# Patient Record
Sex: Female | Born: 1955 | Race: White | Hispanic: No | Marital: Married | Smoking: Former smoker
Health system: Southern US, Academic
[De-identification: ages and names within clinical notes are randomized; demographics above are authoritative.]

## PROBLEM LIST (undated history)

## (undated) DIAGNOSIS — R112 Nausea with vomiting, unspecified: Secondary | ICD-10-CM

## (undated) DIAGNOSIS — M109 Gout, unspecified: Secondary | ICD-10-CM

## (undated) DIAGNOSIS — E569 Vitamin deficiency, unspecified: Secondary | ICD-10-CM

## (undated) DIAGNOSIS — G2581 Restless legs syndrome: Secondary | ICD-10-CM

## (undated) DIAGNOSIS — F32A Depression, unspecified: Secondary | ICD-10-CM

## (undated) DIAGNOSIS — Z973 Presence of spectacles and contact lenses: Secondary | ICD-10-CM

## (undated) DIAGNOSIS — E039 Hypothyroidism, unspecified: Secondary | ICD-10-CM

## (undated) DIAGNOSIS — F419 Anxiety disorder, unspecified: Secondary | ICD-10-CM

## (undated) DIAGNOSIS — M1990 Unspecified osteoarthritis, unspecified site: Secondary | ICD-10-CM

## (undated) DIAGNOSIS — J309 Allergic rhinitis, unspecified: Secondary | ICD-10-CM

## (undated) DIAGNOSIS — E119 Type 2 diabetes mellitus without complications: Secondary | ICD-10-CM

## (undated) DIAGNOSIS — R519 Headache, unspecified: Secondary | ICD-10-CM

## (undated) DIAGNOSIS — I10 Essential (primary) hypertension: Secondary | ICD-10-CM

## (undated) DIAGNOSIS — G4700 Insomnia, unspecified: Secondary | ICD-10-CM

## (undated) DIAGNOSIS — E7800 Pure hypercholesterolemia, unspecified: Secondary | ICD-10-CM

## (undated) DIAGNOSIS — Z9889 Other specified postprocedural states: Secondary | ICD-10-CM

## (undated) DIAGNOSIS — F329 Major depressive disorder, single episode, unspecified: Secondary | ICD-10-CM

## (undated) HISTORY — DX: Essential (primary) hypertension: I10

## (undated) HISTORY — DX: Gout, unspecified: M10.9

## (undated) HISTORY — DX: Unspecified osteoarthritis, unspecified site: M19.90

## (undated) HISTORY — DX: Vitamin deficiency, unspecified: E56.9

## (undated) HISTORY — DX: Pure hypercholesterolemia, unspecified: E78.00

## (undated) HISTORY — PX: HX BREAST BIOPSY: SHX20

## (undated) HISTORY — DX: Type 2 diabetes mellitus without complications: E11.9

## (undated) HISTORY — PX: ESOPHAGOGASTRODUODENOSCOPY: SHX1529

## (undated) HISTORY — PX: COLONOSCOPY: SHX174

## (undated) HISTORY — DX: Restless legs syndrome: G25.81

## (undated) HISTORY — DX: Hypothyroidism, unspecified: E03.9

## (undated) HISTORY — PX: HX GALL BLADDER SURGERY/CHOLE: SHX55

## (undated) HISTORY — DX: Insomnia, unspecified: G47.00

## (undated) HISTORY — PX: HX DENTAL EXTRACTION: 2100001168

## (undated) HISTORY — DX: Allergic rhinitis, unspecified: J30.9

---
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
## 2009-07-26 ENCOUNTER — Ambulatory Visit (HOSPITAL_COMMUNITY): Payer: Self-pay | Admitting: FAMILY MEDICINE

## 2015-08-04 ENCOUNTER — Ambulatory Visit (HOSPITAL_COMMUNITY): Payer: Self-pay

## 2017-11-07 ENCOUNTER — Other Ambulatory Visit (HOSPITAL_COMMUNITY): Payer: Self-pay

## 2017-11-07 DIAGNOSIS — Z1231 Encounter for screening mammogram for malignant neoplasm of breast: Secondary | ICD-10-CM

## 2018-05-12 ENCOUNTER — Other Ambulatory Visit: Payer: 59 | Attending: FAMILY MEDICINE

## 2018-05-12 ENCOUNTER — Other Ambulatory Visit: Payer: Self-pay

## 2018-05-12 ENCOUNTER — Other Ambulatory Visit (HOSPITAL_COMMUNITY): Payer: Self-pay | Admitting: FAMILY MEDICINE

## 2018-05-12 DIAGNOSIS — E119 Type 2 diabetes mellitus without complications: Secondary | ICD-10-CM | POA: Insufficient documentation

## 2018-05-12 DIAGNOSIS — Z124 Encounter for screening for malignant neoplasm of cervix: Secondary | ICD-10-CM | POA: Insufficient documentation

## 2018-05-12 DIAGNOSIS — E039 Hypothyroidism, unspecified: Secondary | ICD-10-CM | POA: Insufficient documentation

## 2018-05-12 DIAGNOSIS — E785 Hyperlipidemia, unspecified: Principal | ICD-10-CM | POA: Insufficient documentation

## 2018-05-12 DIAGNOSIS — I10 Essential (primary) hypertension: Secondary | ICD-10-CM

## 2018-05-12 LAB — CBC WITH DIFF
BASOPHIL #: <0.1 10*3/uL (ref <=0.20)
BASOPHIL %: 0 %
EOSINOPHIL #: 0.18 x10ˆ3/uL (ref <=0.50)
EOSINOPHIL %: 3 %
HCT: 44.8 % (ref 34.8–46.0)
HGB: 13.8 g/dL (ref 11.5–16.0)
IMMATURE GRANULOCYTE #: <0.1 10*3/uL (ref <0.10)
IMMATURE GRANULOCYTE %: 0 % (ref 0–1)
LYMPHOCYTE #: 2.17 x10ˆ3/uL (ref 1.00–4.80)
LYMPHOCYTE %: 38 %
MCH: 30 pg (ref 26.0–32.0)
MCHC: 30.8 g/dL — ABNORMAL LOW (ref 31.0–35.5)
MCV: 97.4 fL (ref 78.0–100.0)
MONOCYTE #: 0.33 x10ˆ3/uL (ref 0.20–1.10)
MONOCYTE %: 6 %
MPV: 12.8 fL — ABNORMAL HIGH (ref 8.7–12.5)
NEUTROPHIL #: 2.96 x10ˆ3/uL (ref 1.50–7.70)
NEUTROPHIL %: 53 %
PLATELETS: 243 10*3/uL (ref 150–400)
RBC: 4.6 x10ˆ6/uL (ref 3.85–5.22)
RDW-CV: 13.5 % (ref 11.5–15.5)
WBC: 5.7 x10ˆ3/uL (ref 3.7–11.0)

## 2018-05-12 LAB — COMPREHENSIVE METABOLIC PANEL, NON-FASTING
ALBUMIN: 4.6 g/dL (ref 3.5–5.0)
ALKALINE PHOSPHATASE: 72 U/L (ref 38–126)
ALT (SGPT): 28 U/L (ref 0–34)
ANION GAP: 12 mmol/L (ref 8–16)
AST (SGOT): 31 U/L (ref 14–36)
BILIRUBIN TOTAL: 1.2 mg/dL (ref 0.2–1.3)
BUN/CREA RATIO: 17
BUN: 12 mg/dL (ref 7–17)
CALCIUM: 10.1 mg/dL (ref 8.4–10.2)
CHLORIDE: 98 mmol/L (ref 98–107)
CO2 TOTAL: 28 mmol/L (ref 22–30)
CREATININE: 0.7 mg/dL (ref 0.52–1.04)
ESTIMATED GFR: >60 mL/min/1.73mˆ2 (ref >60)
GLUCOSE: 158 mg/dL — ABNORMAL HIGH (ref 74–106)
POTASSIUM: 4 mmol/L (ref 3.5–5.1)
PROTEIN TOTAL: 7.4 g/dL (ref 6.3–8.2)
SODIUM: 138 mmol/L (ref 137–145)

## 2018-05-12 LAB — HGA1C (HEMOGLOBIN A1C WITH EST AVG GLUCOSE)
ESTIMATED AVERAGE GLUCOSE: 171 mg/dL
HEMOGLOBIN A1C: 7.6 % — ABNORMAL HIGH (ref 4.0–6.0)

## 2018-05-12 LAB — LIPID PANEL
CHOLESTEROL: 195 mg/dL (ref 0–199)
HDL CHOL: 50 mg/dL (ref 40–59)
LDL CALC: 98 mg/dL (ref 0–99)
TRIGLYCERIDES: 237 mg/dL — ABNORMAL HIGH (ref 0–149)
VLDL CALC: 47 mg/dL (ref 0–50)

## 2018-05-12 LAB — THYROID STIMULATING HORMONE (SENSITIVE TSH): TSH: 1.34 u[IU]/mL (ref 0.470–4.680)

## 2018-05-16 LAB — HUMAN PAPILLOMA VIRUS (HPV) BY PCR WITH HIGH RISK GENOTYPING (THINPREP)
HPV OTHER: NEGATIVE
HPV16 PCR: NEGATIVE
HPV18 PCR: NEGATIVE

## 2018-09-15 ENCOUNTER — Other Ambulatory Visit: Payer: 59 | Attending: FAMILY MEDICINE

## 2018-09-15 ENCOUNTER — Other Ambulatory Visit: Payer: Self-pay

## 2018-09-15 DIAGNOSIS — R300 Dysuria: Secondary | ICD-10-CM | POA: Insufficient documentation

## 2018-09-15 DIAGNOSIS — E119 Type 2 diabetes mellitus without complications: Secondary | ICD-10-CM | POA: Insufficient documentation

## 2018-09-15 LAB — URINALYSIS, MICROSCOPIC

## 2018-09-15 LAB — URINALYSIS, MACROSCOPIC
BILIRUBIN: NEGATIVE mg/dL
BLOOD: NEGATIVE mg/dL
GLUCOSE: NEGATIVE mg/dL
KETONES: NEGATIVE mg/dL
NITRITE: NEGATIVE
PH: 6.5 (ref 5.0–8.5)
PROTEIN: NEGATIVE mg/dL
SPECIFIC GRAVITY: <=1.005 (ref 1.005–1.030)
UROBILINOGEN: 0.2 mg/dL

## 2018-09-25 ENCOUNTER — Ambulatory Visit (INDEPENDENT_AMBULATORY_CARE_PROVIDER_SITE_OTHER): Payer: 59 | Admitting: Surgery

## 2018-09-25 ENCOUNTER — Ambulatory Visit
Admission: RE | Admit: 2018-09-25 | Discharge: 2018-09-25 | Disposition: A | Discharge status: Home / Self Care | Payer: 59 | Source: Ambulatory Visit | Attending: Surgery | Admitting: Surgery

## 2018-09-25 ENCOUNTER — Encounter (INDEPENDENT_AMBULATORY_CARE_PROVIDER_SITE_OTHER): Payer: Self-pay | Admitting: Surgery

## 2018-09-25 ENCOUNTER — Other Ambulatory Visit: Payer: Self-pay

## 2018-09-25 VITALS — Ht 64.0 in | Wt 227.0 lb

## 2018-09-25 DIAGNOSIS — E119 Type 2 diabetes mellitus without complications: Secondary | ICD-10-CM | POA: Insufficient documentation

## 2018-09-25 DIAGNOSIS — Z1211 Encounter for screening for malignant neoplasm of colon: Secondary | ICD-10-CM | POA: Insufficient documentation

## 2018-09-25 DIAGNOSIS — I10 Essential (primary) hypertension: Secondary | ICD-10-CM

## 2018-09-25 DIAGNOSIS — E039 Hypothyroidism, unspecified: Secondary | ICD-10-CM | POA: Insufficient documentation

## 2018-09-25 DIAGNOSIS — Z79890 Hormone replacement therapy (postmenopausal): Secondary | ICD-10-CM | POA: Insufficient documentation

## 2018-09-25 DIAGNOSIS — Z01818 Encounter for other preprocedural examination: Secondary | ICD-10-CM | POA: Insufficient documentation

## 2018-09-25 DIAGNOSIS — Z7984 Long term (current) use of oral hypoglycemic drugs: Secondary | ICD-10-CM | POA: Insufficient documentation

## 2018-09-25 DIAGNOSIS — M1990 Unspecified osteoarthritis, unspecified site: Secondary | ICD-10-CM | POA: Insufficient documentation

## 2018-09-25 DIAGNOSIS — Z79899 Other long term (current) drug therapy: Secondary | ICD-10-CM | POA: Insufficient documentation

## 2018-09-25 DIAGNOSIS — Z7982 Long term (current) use of aspirin: Secondary | ICD-10-CM | POA: Insufficient documentation

## 2018-09-25 DIAGNOSIS — Z9049 Acquired absence of other specified parts of digestive tract: Secondary | ICD-10-CM | POA: Insufficient documentation

## 2018-09-25 DIAGNOSIS — Z791 Long term (current) use of non-steroidal anti-inflammatories (NSAID): Secondary | ICD-10-CM | POA: Insufficient documentation

## 2018-09-25 LAB — ECG 12-LEAD
Atrial Rate: 79 {beats}/min
Calculated P Axis: 60 degrees
Calculated R Axis: 53 degrees
Calculated T Axis: 39 degrees
PR Interval: 154 ms
QRS Duration: 82 ms
QT Interval: 394 ms
QTC Calculation: 451 ms
Ventricular rate: 79 {beats}/min

## 2018-09-25 NOTE — H&P (Signed)
[facility]  GENERAL SURGERY, AMBULATORY CARE CENTER  [address] [place]  [place] [address]  [phone]   General Surgery History & Physical    Date of Service:  09/25/2018  Gwendolyn Crawford, Gwendolyn Crawford, 63 y.o. female  Date of Birth:  April 11, 1955  PCP: Charlie Pitter, MD  Referring:  Charlie Pitter     Chief Complaint:  Colonoscopy    HPI:  Gwendolyn Crawford is a 63 y.o. White female who presents for  screening colonoscopy.  The patient states her last colonoscopy was greater than 10 years ago.  She denies any prior history of colon polyps.  She denies any family history of colon or rectal cancer.  She has no lower GI symptoms or complaints at this time.      Past Medical History:   Diagnosis Date   . Allergic rhinitis    . Arthritis    . Diabetes mellitus, type 2 (CMS HCC)    . Hypertension    . Hypothyroidism    . Vitamin deficiency       Past Surgical History:   Procedure Laterality Date   . COLONOSCOPY     . HX CHOLECYSTECTOMY        Social History     Tobacco Use   . Smoking status: Never Smoker   . Smokeless tobacco: Never Used   Substance Use Topics   . Alcohol use: Never     Frequency: Never   . Drug use: Not on file       Family Medical History:     None         Outpatient Medications Marked as Taking for the 09/25/18 encounter (Office Visit) with Royetta Crochet, MD   Medication Sig   . acetaminophen (TYLENOL) 325 mg Oral Tablet Take 325 mg by mouth Every 4 hours as needed for Pain   . allopurinoL (ZYLOPRIM) 100 mg Oral Tablet    . alpha lipoic acid 600 mg Oral Tablet Take by mouth Twice daily   . ascorbic acid (VITAMIN C) 1,000 mg Oral Tablet Take 1,000 mg by mouth Once a day   . aspirin (ECOTRIN) 81 mg Oral Tablet, Delayed Release (E.C.) Take 81 mg by mouth Once a day   . atorvastatin (LIPITOR) 10 mg Oral Tablet    . biotin 1 mg Oral Capsule Take by mouth   . Biotin 2,500 mcg Oral Capsule Take by mouth Take 2 capsules daily   . cholecalciferol, vitamin D3, 25 mcg (1,000 unit) Oral Tablet Take  1,000 Units by mouth Once a day   . cinnamon bark (CINNAMON ORAL) Take 1,000 mg by mouth bid   . cyanocobalamin (VITAMIN B 12) 1,000 mcg Oral Tablet Take 1,000 mcg by mouth Once a day   . DULoxetine (CYMBALTA DR) 60 mg Oral Capsule, Delayed Release(E.C.)    . gabapentin (NEURONTIN) 600 mg Oral Tablet    . glimepiride (AMARYL) 1 mg Oral Tablet    . levothyroxine (SYNTHROID) 100 mcg Oral Tablet    . lisinopriL (PRINIVIL) 5 mg Oral Tablet    . loratadine (CLARITIN) 10 mg Oral Tablet Take 10 mg by mouth Once a day   . meclizine (ANTIVERT) 25 mg Oral Tablet    . MetFORMIN (GLUCOPHAGE) 1,000 mg Oral Tablet    . multivit with minerals/lutein (MULTIVITAMIN 50 PLUS ORAL) Take by mouth   . naproxen Sodium (ALEVE) 220 mg Oral Tablet Take 220 mg by mouth Twice daily with food   . triamcinolone  acetonide (ARISTOCORT) 0.5 % Cream       No Known Allergies     Review of Systems   Constitutional: Negative for chills and fever.   Respiratory: Negative for cough and shortness of breath.    Cardiovascular: Negative for chest pain and palpitations.   Gastrointestinal: Negative for nausea and vomiting.   Genitourinary: Negative for dysuria.   Skin: Negative for itching and rash.   Psychiatric/Behavioral: Negative for depression. The patient is not nervous/anxious.           Ht 1.626 m (_0 )   Wt 103 kg (227 lb)   BMI 38.96 kg/m          Physical Exam   Constitutional: She is oriented to person, place, and time and well-developed, well-nourished, and in no distress. No distress.   HENT:   Head: Normocephalic and atraumatic.   Cardiovascular: Intact distal pulses.   Pulmonary/Chest: Effort normal. No respiratory distress.   Abdominal: Soft. She exhibits no distension. There is no abdominal tenderness.   Neurological: She is alert and oriented to person, place, and time.   Skin: Skin is warm and dry. She is not diaphoretic. No erythema.   Psychiatric: Mood, affect and judgment normal.        Data Review:  Pertinent laboratory data and  imaging studies reviewed.      Assessment/Plan:  1. Screening for colon cancer         Patient will be scheduled for colonoscopy for screening.  Hold aspirin 5 days prior.  Preoperative ECG will be ordered.    Return for schedule colonoscopy, MAC, hold ASA for 5 days.     This note was partially created using voice recognition software and is inherently subject to errors including those of syntax and "sound alike " substitutions which may escape proof reading. In such instances, original meaning may be extrapolated by contextual derivation.    Royetta Crochet, MD

## 2018-09-25 NOTE — H&P (View-Only) (Signed)
[facility]  GENERAL SURGERY, [facility]BULATORY CARE CENTER  [address]  [place] [address]  [phone]   General Surgery History & Physical    Date of Service:  09/25/2018  Camey, Deaisa J, 63 y.o. female  Date of Birth:  07/05/1955  PCP: Sunita Greenberg, MD  Referring:  Sunita Greenberg     Chief Complaint:  Colonoscopy    HPI:  Maleah J Leppo is a 63 y.o. White female who presents for  screening colonoscopy.  The patient states her last colonoscopy was greater than 10 years ago.  She denies any prior history of colon polyps.  She denies any family history of colon or rectal cancer.  She has no lower GI symptoms or complaints at this time.      Past Medical History:   Diagnosis Date   . Allergic rhinitis    . Arthritis    . Diabetes mellitus, type 2 (CMS HCC)    . Hypertension    . Hypothyroidism    . Vitamin deficiency       Past Surgical History:   Procedure Laterality Date   . COLONOSCOPY     . HX CHOLECYSTECTOMY        Social History     Tobacco Use   . Smoking status: Never Smoker   . Smokeless tobacco: Never Used   Substance Use Topics   . Alcohol use: Never     Frequency: Never   . Drug use: Not on file       Family Medical History:     None         Outpatient Medications Marked as Taking for the 09/25/18 encounter (Office Visit) with Manaia Samad, MD   Medication Sig   . acetaminophen (TYLENOL) 325 mg Oral Tablet Take 325 mg by mouth Every 4 hours as needed for Pain   . allopurinoL (ZYLOPRIM) 100 mg Oral Tablet    . alpha lipoic acid 600 mg Oral Tablet Take by mouth Twice daily   . ascorbic acid (VITAMIN C) 1,000 mg Oral Tablet Take 1,000 mg by mouth Once a day   . aspirin (ECOTRIN) 81 mg Oral Tablet, Delayed Release (E.C.) Take 81 mg by mouth Once a day   . atorvastatin (LIPITOR) 10 mg Oral Tablet    . biotin 1 mg Oral Capsule Take by mouth   . Biotin 2,500 mcg Oral Capsule Take by mouth Take 2 capsules daily   . cholecalciferol, vitamin D3, 25 mcg (1,000 unit) Oral Tablet Take  1,000 Units by mouth Once a day   . cinnamon bark (CINNAMON ORAL) Take 1,000 mg by mouth bid   . cyanocobalamin (VITAMIN B 12) 1,000 mcg Oral Tablet Take 1,000 mcg by mouth Once a day   . DULoxetine (CYMBALTA DR) 60 mg Oral Capsule, Delayed Release(E.C.)    . gabapentin (NEURONTIN) 600 mg Oral Tablet    . glimepiride (AMARYL) 1 mg Oral Tablet    . levothyroxine (SYNTHROID) 100 mcg Oral Tablet    . lisinopriL (PRINIVIL) 5 mg Oral Tablet    . loratadine (CLARITIN) 10 mg Oral Tablet Take 10 mg by mouth Once a day   . meclizine (ANTIVERT) 25 mg Oral Tablet    . MetFORMIN (GLUCOPHAGE) 1,000 mg Oral Tablet    . multivit with minerals/lutein (MULTIVITAMIN 50 PLUS ORAL) Take by mouth   . naproxen Sodium (ALEVE) 220 mg Oral Tablet Take 220 mg by mouth Twice daily with food   . triamcinolone   acetonide (ARISTOCORT) 0.5 % Cream       No Known Allergies     Review of Systems   Constitutional: Negative for chills and fever.   Respiratory: Negative for cough and shortness of breath.    Cardiovascular: Negative for chest pain and palpitations.   Gastrointestinal: Negative for nausea and vomiting.   Genitourinary: Negative for dysuria.   Skin: Negative for itching and rash.   Psychiatric/Behavioral: Negative for depression. The patient is not nervous/anxious.           Ht 1.626 m (5' 4")   Wt 103 kg (227 lb)   BMI 38.96 kg/m          Physical Exam   Constitutional: She is oriented to person, place, and time and well-developed, well-nourished, and in no distress. No distress.   HENT:   Head: Normocephalic and atraumatic.   Cardiovascular: Intact distal pulses.   Pulmonary/Chest: Effort normal. No respiratory distress.   Abdominal: Soft. She exhibits no distension. There is no abdominal tenderness.   Neurological: She is alert and oriented to person, place, and time.   Skin: Skin is warm and dry. She is not diaphoretic. No erythema.   Psychiatric: Mood, affect and judgment normal.        Data Review:  Pertinent laboratory data and  imaging studies reviewed.      Assessment/Plan:  1. Screening for colon cancer         Patient will be scheduled for colonoscopy for screening.  Hold aspirin 5 days prior.  Preoperative ECG will be ordered.    Return for schedule colonoscopy, MAC, hold ASA for 5 days.     This note was partially created using voice recognition software and is inherently subject to errors including those of syntax and "sound alike " substitutions which may escape proof reading. In such instances, original meaning may be extrapolated by contextual derivation.    Harlie Ragle, MD

## 2018-09-25 NOTE — Patient Instructions (Signed)
INSTRUCTIONS FOR A COLONOSCOPY BOWEL PREP    Your surgical procedure is scheduled for 10-03-2018     To ensure a thorough examination, your colon must be completely emptied of all waste products.  It is important to complete your prep as instructed.  An incomplete prep can block your doctor's view of your colon--potentially leading to missed lesions or polyps, and may result in a cancelled procedure.  You will need to purchase the following items from your local pharmacy for your bowel prep:     ? 4 Dulcolax tablets  ? 238 gram bottle of Miralax Laxative Powder  ? 64 ounces of Gatorade, Sprite, or any clear liquid (NO RED or PURPLE!!)       The day before your procedure:     Your diet should consist of ONLY clear liquids and NO solid floods.  This includes the following:     Water Strained fruit juice without pulp Tea or coffee (without milk or cream)   Clear broth Ginger ale Lemon-lime soda   Sports drinks Flavor drinks Plain Jell-O   Popsicles        At Noon:   ? Take 2 Dulcolax tablets.  ? Mix half of Miralax with 32 oz. of Gatorade, sprite or any clear liquid.  ? Drink an 8 oz. glass of the mixture every 15-20 minutes.     At 4 PM   ? Repeat the steps above.  ? After you have completed your prep, please continue on a CLEAR LIQUID DIET until midnight.  This will help keep you hydrated.     After Midnight:   ? DO NOT EAT OR DRINK AFTER MIDNIGHT they day of your procedure, unless otherwise instructed.    ? This INCLUDES gum, mints, and candy          The surgery department will contact you the day before your procedure between 1 PM and 3 PM with your arrival time.  If you are scheduled on a Monday, you will be contacted the Friday afternoon prior to your procedure.       **REMINDER:  A pre-op assessment must be done within one week of the date of your procedure.    You may contact the pre-op nurse at [phone].  *You will need to bring your insurance card and photo ID with you to all appointments.    Any  questions please call our office [phone]

## 2018-09-26 ENCOUNTER — Other Ambulatory Visit: Payer: Self-pay

## 2018-09-26 ENCOUNTER — Encounter (HOSPITAL_COMMUNITY): Payer: Self-pay | Admitting: Surgery

## 2018-10-03 ENCOUNTER — Ambulatory Visit (HOSPITAL_COMMUNITY): Payer: 59 | Admitting: Certified Registered"

## 2018-10-03 ENCOUNTER — Encounter (HOSPITAL_COMMUNITY): Payer: Self-pay | Admitting: Surgery

## 2018-10-03 ENCOUNTER — Other Ambulatory Visit: Payer: Self-pay

## 2018-10-03 ENCOUNTER — Inpatient Hospital Stay
Admission: RE | Admit: 2018-10-03 | Discharge: 2018-10-03 | Disposition: A | Discharge status: Home / Self Care | Payer: 59 | Source: Ambulatory Visit | Attending: Surgery | Admitting: Surgery

## 2018-10-03 ENCOUNTER — Encounter (HOSPITAL_COMMUNITY)
Admission: RE | Disposition: A | Discharge status: Home / Self Care | Payer: Self-pay | Source: Ambulatory Visit | Attending: Surgery

## 2018-10-03 DIAGNOSIS — Z7984 Long term (current) use of oral hypoglycemic drugs: Secondary | ICD-10-CM | POA: Insufficient documentation

## 2018-10-03 DIAGNOSIS — M1990 Unspecified osteoarthritis, unspecified site: Secondary | ICD-10-CM | POA: Insufficient documentation

## 2018-10-03 DIAGNOSIS — E119 Type 2 diabetes mellitus without complications: Secondary | ICD-10-CM | POA: Insufficient documentation

## 2018-10-03 DIAGNOSIS — Z7982 Long term (current) use of aspirin: Secondary | ICD-10-CM | POA: Insufficient documentation

## 2018-10-03 DIAGNOSIS — I10 Essential (primary) hypertension: Secondary | ICD-10-CM | POA: Insufficient documentation

## 2018-10-03 DIAGNOSIS — K219 Gastro-esophageal reflux disease without esophagitis: Secondary | ICD-10-CM | POA: Insufficient documentation

## 2018-10-03 DIAGNOSIS — F329 Major depressive disorder, single episode, unspecified: Secondary | ICD-10-CM | POA: Insufficient documentation

## 2018-10-03 DIAGNOSIS — K5730 Diverticulosis of large intestine without perforation or abscess without bleeding: Secondary | ICD-10-CM | POA: Insufficient documentation

## 2018-10-03 DIAGNOSIS — Z1211 Encounter for screening for malignant neoplasm of colon: Secondary | ICD-10-CM | POA: Insufficient documentation

## 2018-10-03 DIAGNOSIS — Z79899 Other long term (current) drug therapy: Secondary | ICD-10-CM | POA: Insufficient documentation

## 2018-10-03 DIAGNOSIS — F419 Anxiety disorder, unspecified: Secondary | ICD-10-CM | POA: Insufficient documentation

## 2018-10-03 DIAGNOSIS — E039 Hypothyroidism, unspecified: Secondary | ICD-10-CM | POA: Insufficient documentation

## 2018-10-03 HISTORY — DX: Anxiety disorder, unspecified: F41.9

## 2018-10-03 HISTORY — DX: Nausea with vomiting, unspecified: R11.2

## 2018-10-03 HISTORY — DX: Presence of spectacles and contact lenses: Z97.3

## 2018-10-03 HISTORY — DX: Nausea with vomiting, unspecified: Z98.890

## 2018-10-03 HISTORY — DX: Depression, unspecified: F32.A

## 2018-10-03 HISTORY — DX: Hypothyroidism, unspecified: E03.9

## 2018-10-03 HISTORY — DX: Headache, unspecified: R51.9

## 2018-10-03 SURGERY — COLONOSCOPY
Anesthesia: IV General | Wound class: Clean Contaminated Wounds-The respiratory, GI, Genital, or urinary

## 2018-10-03 MED ORDER — SODIUM CHLORIDE 0.9 % (FLUSH) INJECTION SYRINGE
10.0000 mL | INJECTION | Freq: Three times a day (TID) | INTRAMUSCULAR | Status: DC
Start: 2018-10-03 — End: 2018-10-03

## 2018-10-03 MED ORDER — SODIUM CHLORIDE 0.9 % INTRAVENOUS SOLUTION
12.5000 mg | Freq: Once | INTRAVENOUS | Status: DC | PRN
Start: 2018-10-03 — End: 2018-10-03

## 2018-10-03 MED ORDER — LIDOCAINE (PF) 20 MG/ML (2 %) INJECTION SOLUTION
INTRAMUSCULAR | Status: AC
Start: 2018-10-03 — End: 2018-10-03
  Filled 2018-10-03: qty 5

## 2018-10-03 MED ORDER — SODIUM CHLORIDE 0.9 % (FLUSH) INJECTION SYRINGE
10.0000 mL | INJECTION | Freq: Three times a day (TID) | INTRAMUSCULAR | Status: DC
Start: 2018-10-03 — End: 2018-10-03
  Administered 2018-10-03: 09:00:00

## 2018-10-03 MED ORDER — PROPOFOL 10 MG/ML INTRAVENOUS EMULSION
INTRAVENOUS | Status: DC | PRN
Start: 2018-10-03 — End: 2018-10-03
  Administered 2018-10-03: 200 ug/kg/min via INTRAVENOUS
  Administered 2018-10-03: 180 ug/kg/min via INTRAVENOUS
  Administered 2018-10-03: 140 ug/kg/min via INTRAVENOUS
  Administered 2018-10-03: 0 ug/kg/min via INTRAVENOUS

## 2018-10-03 MED ORDER — ONDANSETRON HCL (PF) 4 MG/2 ML INJECTION SOLUTION
4.0000 mg | Freq: Once | INTRAMUSCULAR | Status: DC | PRN
Start: 2018-10-03 — End: 2018-10-03

## 2018-10-03 MED ORDER — SODIUM CHLORIDE 0.9 % (FLUSH) INJECTION SYRINGE
10.0000 mL | INJECTION | INTRAMUSCULAR | Status: DC | PRN
Start: 2018-10-03 — End: 2018-10-03

## 2018-10-03 MED ORDER — SODIUM CHLORIDE 0.9 % INTRAVENOUS SOLUTION
INTRAVENOUS | Status: DC | PRN
Start: 2018-10-03 — End: 2018-10-03
  Administered 2018-10-03 (×2): via INTRAVENOUS

## 2018-10-03 MED ORDER — ONDANSETRON HCL (PF) 4 MG/2 ML INJECTION SOLUTION
Freq: Once | INTRAMUSCULAR | Status: DC | PRN
Start: 2018-10-03 — End: 2018-10-03
  Administered 2018-10-03: 4 mg via INTRAVENOUS

## 2018-10-03 MED ORDER — ONDANSETRON HCL (PF) 4 MG/2 ML INJECTION SOLUTION
INTRAMUSCULAR | Status: AC
Start: 2018-10-03 — End: 2018-10-03
  Filled 2018-10-03: qty 2

## 2018-10-03 MED ORDER — PROPOFOL 10 MG/ML IV BOLUS
INJECTION | Freq: Once | INTRAVENOUS | Status: DC | PRN
Start: 2018-10-03 — End: 2018-10-03
  Administered 2018-10-03: 50 mg via INTRAVENOUS

## 2018-10-03 MED ORDER — FENTANYL (PF) 50 MCG/ML INJECTION SOLUTION
12.5000 ug | INTRAMUSCULAR | Status: DC | PRN
Start: 2018-10-03 — End: 2018-10-03

## 2018-10-03 MED ORDER — PROPOFOL 10 MG/ML INTRAVENOUS EMULSION
INTRAVENOUS | Status: AC
Start: 2018-10-03 — End: 2018-10-03
  Filled 2018-10-03: qty 50

## 2018-10-03 MED ORDER — SODIUM CHLORIDE 0.9 % INTRAVENOUS SOLUTION
INTRAVENOUS | Status: DC
Start: 2018-10-03 — End: 2018-10-03
  Administered 2018-10-03: 08:00:00 via INTRAVENOUS

## 2018-10-03 MED ORDER — FENTANYL (PF) 50 MCG/ML INJECTION SOLUTION
25.0000 ug | INTRAMUSCULAR | Status: DC | PRN
Start: 2018-10-03 — End: 2018-10-03

## 2018-10-03 SURGICAL SUPPLY — 30 items
BRUSH ENDOS CLN 220CM 2-4.2MM PLASTIC CMBN STF THK BRSTL LINE UP DISP (SURGICAL INSTRUMENTS) ×1 IMPLANT
CAN SUCT 800ML PR SPOUT VACU SHUT OFF FILTER HDRPHB 7.25IN 5IN 3 3/8IN (Suction) ×1 IMPLANT
CLIP HMST RADOPQ PRELD STRL DISP RSL 235CM 2.8MM 11MM OPN (SURGICAL INSTRUMENTS) IMPLANT
DEVICE RESOLUTION CLIP 235CML_M00522611 11MMW 10EA/BX (INSTRUMENTS)
DISC NO SUB - SNARE P5MM OVAL 230CM 25MM 3MM ISNARE ALT INJ NEEDLE OLMPS ACT CORD CONN SPRG LD LL HNDL ENDOS (ENDOSCOPIC SUPPLIES) IMPLANT
DUPE USE ITEM 128182 - LIGATOR 5IN .6IN SHORTSHOT SA_TRIVIEW 4 BAND HMRHD ANOSCP (ENDOSCOPIC SUPPLIES) IMPLANT
ELECTRODE PATIENT RTN 9FT VLAB C30- LB RM PHSV ACRL FOAM CORD NONIRRITATE NONSENSITIZE ADH STRP (CAUTERY SUPPLIES) IMPLANT
FORCEPS BIOPSY ALGTR 230CM 2.8MM ENDOJAW SS STRL DISP (SURGICAL INSTRUMENTS) IMPLANT
FORCEPS BIOPSY ALGTR 230CM 2.8_MM ENDOJAW RAT TOOTH STRL DISP (INSTRUMENTS)
GOWN ISOL UNIV BLU HRSK P2 IMP RV OPN BACK CLS LOOP NK STRUP (DGOW) ×2 IMPLANT
MARKER ENDOS PMT ENDOMARK DIL INDIA INK 10ML STRL (GI LAB SUPPLIES) IMPLANT
NEEDLE HYPO 18GA 1IN EDGE NEE_DLEPRO JELCO BVL 1 HAND ACT (NEEDLES & SYRINGE SUPPLIES) IMPLANT
NEEDLE SCLRTX 25GA 2.5MM INJ L L SPRG LD HNDL SHTH STRL DISP (NEEDLES & SYRINGE SUPPLIES) IMPLANT
NET SPEC RETR 230CM 2.5MM RTHNT STD SHEATH 6X3CM NONST LF  DISP (Dilators) IMPLANT
PEN SURG MRKNG INDIA INK (GI LAB SUPPLIES)
PROBE COAG 7.2FT 6.9FR FIAPC CRCMF PLUG PLAY FUNCTIONALITY FILTER (ENDOSCOPIC SUPPLIES) IMPLANT
PROBE ESURG 7.2FT 6.9FR FIAPC_CRCMF PLUG PLAY FUNCTIONALITY (INSTRUMENTS ENDOMECHANICAL)
SNARE THK.47MM STD OVAL 2300MM 2.8MM 25MM SNAREMASTER BRD WRE INT HNDL SHORT LOOP ENDOS CF GIF (BASKET) IMPLANT
SNARE THK.47MM STD OVL 2300MM 2.8MM SNAREMASTER BRD WRE INT (BASKET)
SOL IRRG 0.9% NACL 1000ML PLASTIC PR BTL ISTNC N-PYRG STRL LF (SOLUTIONS) IMPLANT
SOL IV 0.9% NACL 10ML DEHP-FR PRSV FR 1 DS PLASTIC FLIPTOP VIAL USP LF  LIGHT GRN (IV TUBING & ACCESSORIES) IMPLANT
SOL IV 0.9% NACL 10ML PRSV FR 1 DS DEHP-FR PLASTIC FLPTP (IV Tubing & Accessories)
SOLUTION IRRG NS 2F7124 1000CC_12/CS (SOLUTIONS)
SYRINGE GENTLCARE 60CC STRL CATH TIP PEEL PCH SUCT TRANSLUC BULB POLYPROP THRMPLST ELASTO DISP (NEEDLES & SYRINGE SUPPLIES) IMPLANT
SYRINGE LL 10ML LF  STRL GRAD N-PYRG DEHP-FR PVC FREE MED DISP (NEEDLES & SYRINGE SUPPLIES) IMPLANT
TRAP SPECI REM 15CM ETRAP MAGNIFY WIND MSR GUIDE PLPCTM STRL LF  DISP (SURGICAL INSTRUMENTS) IMPLANT
TUBE ASP 3.25IN .25IN ARGYLE PE POLYSTY 9IN 8ML STRL LTX DISP (Suction) IMPLANT
TUBING SUCT 12FT 3/16IN UNIV NCDTV FEMALE CONN STRL LF (Suction) ×1 IMPLANT
UNDERPD INCONT 36X30IN POLYPRO P XHVY ABS NWVN FLF FIL SPNBD (INCT) ×1 IMPLANT
WATER STRL 1000ML PLASTIC PR BTL LF (SOLUTIONS) ×1 IMPLANT

## 2018-10-03 NOTE — Anesthesia Transfer of Care (Signed)
ANESTHESIA TRANSFER OF CARE   Gwendolyn Crawford is a 63 y.o. ,female, Weight: 103 kg (227 lb)   had Procedure(s) with comments:  COLONOSCOPY - PATIENT IS A DIABETIC  performed  10/03/18   Primary Service: Royetta Crochet, MD    Past Medical History:   Diagnosis Date   . Allergic rhinitis    . Anxiety    . Arthritis    . Depression    . Diabetes mellitus, type 2 (CMS HCC)    . Headache    . Hypertension    . Hypothyroid    . Hypothyroidism    . PONV (postoperative nausea and vomiting)    . Type 2 diabetes mellitus (CMS HCC)    . Vitamin deficiency    . Wears glasses       Allergy History as of 10/03/18     ROSUVASTATIN       Noted Status Severity Type Reaction    10/03/18 0813 Inocente Salles, RN 10/03/18 Active Low Intolerance  Other Adverse Reaction (Add comment)    Comments:  unknown               I completed my transfer of care / handoff to the receiving personnel during which we discussed:  Access, Airway, All key/critical aspects of case discussed, Analgesia, Antibiotics, Expectation of post procedure, Fluids/Product, Gave opportunity for questions and acknowledgement of understanding, Labs and PMHx    Post Location: PACU                                          Additional Info:[name], RN                      Last OR Temp: Temperature: 36.1 C (97 F)  ABG:  POTASSIUM   Date Value Ref Range Status   05/12/2018 4.0 3.5 - 5.1 mmol/L Final     KETONES   Date Value Ref Range Status   09/15/2018 Negative Negative mg/dL Final     CALCIUM   Date Value Ref Range Status   05/12/2018 10.1 8.4 - 10.2 mg/dL Final     Calculated P Axis   Date Value Ref Range Status   09/25/2018 60 degrees Final     Calculated R Axis   Date Value Ref Range Status   09/25/2018 53 degrees Final     Calculated T Axis   Date Value Ref Range Status   09/25/2018 39 degrees Final     Airway:* No LDAs found *  Blood pressure (!) 134/59, pulse 78, temperature 36.1 C (97 F), resp. rate 16, height 1.626 m (5\' 4" ), weight 103 kg (227 lb), SpO2 98 %.

## 2018-10-03 NOTE — Interval H&P Note (Signed)
[facility]      H&P O'Brien, 63 y.o. female  Date of Admission:  10/03/2018  Date of Birth:  [birth year]    10/03/2018    STOP: IF H&P IS GREATER THAN 30 DAYS FROM SURGICAL DAY COMPLETE NEW H&P IS REQUIRED.     H & P updated the day of the procedure.  1.  H&P completed within 30 days of surgical procedure  and has been reviewed within 24 hours of the surgery, the patient has been examined, and no change has occured in the patients condition since the H&P was completed.       Change in medications: No      Comments:     2.  Patient continues to be appropriate candidate for planned surgical procedure. YES    Royetta Crochet, MD

## 2018-10-03 NOTE — Anesthesia Postprocedure Evaluation (Signed)
Anesthesia Post Op Evaluation    Patient: Gwendolyn Crawford  Procedure(s) with comments:  COLONOSCOPY - PATIENT IS A DIABETIC    Last Vitals:Temperature: 36.1 C (97 F) (10/03/18 1000)  Heart Rate: 69 (10/03/18 1000)  BP (Non-Invasive): (!) 109/53 (10/03/18 1000)  Respiratory Rate: 16 (10/03/18 1000)  SpO2: 100 % (10/03/18 1000)    Patient is sufficiently recovered from the effects of anesthesia to participate in the evaluation and has returned to their pre-procedure level.  Patient location during evaluation: PACU       Patient participation: waiting for patient participation  Level of consciousness: responsive to verbal stimuli    Pain score: 0  Pain management: adequate  Airway patency: patent    Anesthetic complications: no  Cardiovascular status: acceptable  Respiratory status: acceptable, face mask, nonlabored ventilation and spontaneous ventilation  Hydration status: acceptable  Patient post-procedure temperature: Pt Normothermic   PONV Status: Absent

## 2018-10-03 NOTE — Discharge Instructions (Signed)
SURGICAL DISCHARGE INSTRUCTIONS     Dr. Mullins, Bandy, MD  performed your COLONOSCOPY today at the [facility]    [facility] ([place])  Monday through Friday 8:00 am - 4:00 pm  [phone]  Between 4:00 pm and 8:00 am, weekends and holidays:  Call [facility] at [phone]    PLEASE SEE WRITTEN HANDOUTS AS DISCUSSED BY YOUR NURSE:  Shaneeka Scarboro, RN      SIGNS AND SYMPTOMS OF A WOUND / INCISION INFECTION   Be sure to watch for the following:  Increase in redness or red streaks near or around the wound or incision.  Increase in pain that is intense or severe and cannot be relieved by the pain medication that your doctor has given you.  Increase in swelling that cannot be relieved by elevation of a body part, or by applying ice, if permitted.  Increase in drainage, or if yellow / green in color and smells bad. This could be on a dressing or a cast.  Increase in fever for longer than 24 hours, or an increase that is higher than 101 degrees Fahrenheit (normal body temperature is 98 degrees Fahrenheit). The incision may feel warm to the touch.    **CALL YOUR DOCTOR IF ONE OR MORE OF THESE SIGNS / SYMPTOMS SHOULD OCCUR.    ANESTHESIA INFORMATION   ANESTHESIA -- ADULT PATIENTS:  You have received intravenous sedation / general anesthesia, and you may feel drowsy and light-headed for several hours. You may even experience some forgetfulness of the procedure. DO NOT DRIVE A MOTOR VEHICLE or perform any activity requiring complete alertness or coordination until you feel fully awake in about 24-48 hours. Do not drink alcoholic beverages for at least 24 hours. Do not stay alone, you must have a responsible adult available to be with you. You may also experience a dry mouth or nausea for 24 hours. This is a normal side effect and will disappear as the effects of the medication wear off.    REMEMBER   If you experience any difficulty breathing, chest pain, bleeding that you feel is  excessive, persistent nausea or vomiting or for any other concerns:  Call your physician Dr.  Mullins, Bandy, MD    [phone] at [facility] or [phone] You may also ask to have the general doctor on call paged. They are available to you 24 hours a day.    SPECIAL INSTRUCTIONS / COMMENTS       FOLLOW-UP APPOINTMENTS   Please call your surgeon's office at the number listed about  to schedule a date / time of return.

## 2018-10-03 NOTE — Anesthesia Preprocedure Evaluation (Addendum)
ANESTHESIA PRE-OP EVALUATION  Planned Procedure: COLONOSCOPY (N/A )  Review of Systems    PONV       patient summary reviewed  nursing notes reviewed        Pulmonary  negative pulmonary ROS,    Cardiovascular    Hypertension, ECG reviewed and NSR per EKG  Denies CP or SOB with exertion ,       GI/Hepatic/Renal    Food related, GERD and well controlled     Endo/Other    FSBG 182 and hypothyroidism,   type 2 diabetes     Neuro/Psych/MS    headaches, anxiety and depression     Cancer  negative hematology/oncology ROS,                  Physical Assessment      Patient summary reviewed and Nursing notes reviewed   Airway       Mallampati: II    TM distance: >3 FB    Neck ROM: full  Mouth Opening: good.            Dental           (+) chipped           Pulmonary    Breath sounds clear to auscultation       Cardiovascular    Rhythm: regular  Rate: Normal       Other findings            Plan  ASA 3     Planned anesthesia type: MAC and TIVA              Intravenous induction     Anesthesia issues/risks discussed are: Stroke, Intraoperative Awareness/ Recall, Cardiac Events/MI, Post-op Cognitive Dysfunction and Aspiration.  Anesthetic plan and risks discussed with patient.          Patient's NPO status is appropriate for Anesthesia.

## 2018-10-14 MED ORDER — LIDOCAINE HCL 20 MG/ML (2 %) INJECTION SOLUTION
Freq: Once | INTRAMUSCULAR | Status: DC | PRN
Start: 2018-10-03 — End: 2018-10-14
  Administered 2018-10-03: 100 mg via INTRAVENOUS

## 2018-10-14 NOTE — Addendum Note (Signed)
Addendum  created 10/14/18 0734 by Juline Patch, CRNA    Intraprocedure Meds edited

## 2018-10-27 ENCOUNTER — Encounter (HOSPITAL_COMMUNITY): Payer: Self-pay

## 2018-10-27 ENCOUNTER — Other Ambulatory Visit: Payer: Self-pay

## 2018-10-27 ENCOUNTER — Ambulatory Visit
Admission: RE | Admit: 2018-10-27 | Discharge: 2018-10-27 | Disposition: A | Discharge status: Home / Self Care | Payer: 59 | Source: Ambulatory Visit | Attending: FAMILY MEDICINE | Admitting: FAMILY MEDICINE

## 2018-10-27 DIAGNOSIS — Z1231 Encounter for screening mammogram for malignant neoplasm of breast: Secondary | ICD-10-CM | POA: Insufficient documentation

## 2018-10-28 ENCOUNTER — Other Ambulatory Visit (HOSPITAL_COMMUNITY): Payer: Self-pay

## 2018-10-28 DIAGNOSIS — Z1231 Encounter for screening mammogram for malignant neoplasm of breast: Secondary | ICD-10-CM

## 2018-12-23 ENCOUNTER — Other Ambulatory Visit (INDEPENDENT_AMBULATORY_CARE_PROVIDER_SITE_OTHER): Payer: Self-pay | Admitting: FAMILY MEDICINE

## 2018-12-23 MED ORDER — LEVOTHYROXINE 100 MCG TABLET
100.00 ug | ORAL_TABLET | Freq: Every morning | ORAL | 1 refills | Status: AC
Start: 2018-12-23 — End: ?

## 2018-12-29 ENCOUNTER — Other Ambulatory Visit (INDEPENDENT_AMBULATORY_CARE_PROVIDER_SITE_OTHER): Payer: Self-pay | Admitting: FAMILY MEDICINE

## 2018-12-29 MED ORDER — GABAPENTIN 600 MG TABLET
600.00 mg | ORAL_TABLET | Freq: Three times a day (TID) | ORAL | 1 refills | Status: DC
Start: 2018-12-29 — End: 2019-06-25

## 2019-01-08 ENCOUNTER — Encounter (INDEPENDENT_AMBULATORY_CARE_PROVIDER_SITE_OTHER): Payer: Self-pay | Admitting: FAMILY MEDICINE

## 2019-01-08 DIAGNOSIS — E782 Mixed hyperlipidemia: Secondary | ICD-10-CM

## 2019-01-08 DIAGNOSIS — E119 Type 2 diabetes mellitus without complications: Secondary | ICD-10-CM | POA: Insufficient documentation

## 2019-01-08 DIAGNOSIS — I10 Essential (primary) hypertension: Secondary | ICD-10-CM

## 2019-01-08 DIAGNOSIS — E1142 Type 2 diabetes mellitus with diabetic polyneuropathy: Secondary | ICD-10-CM | POA: Insufficient documentation

## 2019-01-08 DIAGNOSIS — E039 Hypothyroidism, unspecified: Secondary | ICD-10-CM

## 2019-01-18 DIAGNOSIS — F3341 Major depressive disorder, recurrent, in partial remission: Secondary | ICD-10-CM | POA: Insufficient documentation

## 2019-01-18 DIAGNOSIS — F325 Major depressive disorder, single episode, in full remission: Secondary | ICD-10-CM | POA: Insufficient documentation

## 2019-01-18 DIAGNOSIS — F419 Anxiety disorder, unspecified: Secondary | ICD-10-CM | POA: Insufficient documentation

## 2019-01-18 DIAGNOSIS — F3340 Major depressive disorder, recurrent, in remission, unspecified: Secondary | ICD-10-CM | POA: Insufficient documentation

## 2019-01-18 DIAGNOSIS — Z8659 Personal history of other mental and behavioral disorders: Secondary | ICD-10-CM | POA: Insufficient documentation

## 2019-01-18 DIAGNOSIS — G2581 Restless legs syndrome: Secondary | ICD-10-CM | POA: Insufficient documentation

## 2019-01-18 NOTE — Progress Notes (Signed)
Family Medicine, [facility]  [place] [address]  [phone]      Patient Name:  Gwendolyn Crawford  MRN:  [id number]  DOB:  10-31-1955    Date of Service:  01/19/2019    Chief Complaint:   Chief Complaint   Patient presents with   . Multiple medical problems follow up       Subjective:  Gwendolyn Crawford is a 63 y.o. female who returns for a f/u apt. She states that she has been doing well. She has had her flu vaccine and eye exam. She is to check with the health dept about the tetanus and shingrix. She has had her mammogram and colonoscopy this year.       Review of Systems   Constitutional: Negative for fever.   HENT: Negative for sore throat.    Eyes: Negative for blurred vision.   Respiratory: Negative for cough and shortness of breath.    Cardiovascular: Negative for chest pain.   Gastrointestinal: Negative for abdominal pain, nausea and vomiting.   Genitourinary: Negative for dysuria.   Musculoskeletal: Negative for falls.   Skin: Negative for rash.   Neurological: Negative for weakness.       Current Outpatient Medications   Medication Sig   . acetaminophen (TYLENOL) 325 mg Oral Tablet Take 325 mg by mouth Every 4 hours as needed for Pain   . allopurinoL (ZYLOPRIM) 100 mg Oral Tablet Take 100 mg by mouth Twice daily    . alpha lipoic acid 600 mg Oral Tablet Take by mouth Twice daily   . ascorbic acid (VITAMIN C) 1,000 mg Oral Tablet Take 1,000 mg by mouth Once a day   . aspirin (ECOTRIN) 81 mg Oral Tablet, Delayed Release (E.C.) Take 81 mg by mouth Once a day   . atorvastatin (LIPITOR) 10 mg Oral Tablet 10 mg Once a day    . biotin 1 mg Oral Capsule Take by mouth   . Biotin 2,500 mcg Oral Capsule Take 2 Each by mouth Once a day Take 2 capsules daily    . cholecalciferol, vitamin D3, 25 mcg (1,000 unit) Oral Tablet Take 1,000 Units by mouth Once a day   . cinnamon bark (CINNAMON ORAL) Take 1,000 mg by mouth bid   . cyanocobalamin (VITAMIN B 12) 1,000 mcg Oral Tablet Take  1,000 mcg by mouth Once a day   . DULoxetine (CYMBALTA DR) 60 mg Oral Capsule, Delayed Release(E.C.) Take 60 mg by mouth Once a day    . gabapentin (NEURONTIN) 600 mg Oral Tablet Take 1 Tab (600 mg total) by mouth Three times a day   . glimepiride (AMARYL) 1 mg Oral Tablet Take 0.5 mg by mouth    . levothyroxine (SYNTHROID) 100 mcg Oral Tablet Take 1 Tab (100 mcg total) by mouth Every morning   . lisinopriL (PRINIVIL) 5 mg Oral Tablet Take 5 mg by mouth Once a day    . loratadine (CLARITIN) 10 mg Oral Tablet Take 10 mg by mouth Once a day   . meclizine (ANTIVERT) 25 mg Oral Tablet Take 25 mg by mouth Every 8 hours as needed    . MetFORMIN (GLUCOPHAGE) 1,000 mg Oral Tablet Take 1,000 mg by mouth Twice daily with food    . multivit with minerals/lutein (MULTIVITAMIN 50 PLUS ORAL) Take by mouth   . naproxen Sodium (ALEVE) 220 mg Oral Tablet Take 220 mg by mouth Every 12 hours as needed    .  triamcinolone acetonide (ARISTOCORT) 0.5 % Cream        Allergies   Allergen Reactions   . Zocor [Simvastatin] Myalgia   . Crestor [Rosuvastatin]  Other Adverse Reaction (Add comment) and Myalgia     unknown       Objective:    BP 130/80   Pulse 91   Temp 36.3 C (97.3 F)   Resp 20   Ht 1.626 m ( )   Wt 98.7 kg (217 lb 9.6 oz)   SpO2 97%   BMI 37.35 kg/m       Physical Exam   Constitutional: She is oriented to person, place, and time and well-developed, well-nourished, and in no distress.   HENT:   Head: Normocephalic and atraumatic.   Right Ear: External ear normal.   Left Ear: External ear normal.   Mouth/Throat: No oropharyngeal exudate.   Right tm partially obscured by cerumen on the right.    Eyes: Pupils are equal, round, and reactive to light. Conjunctivae and EOM are normal. Right eye exhibits no discharge. Left eye exhibits no discharge.   Neck: Normal range of motion. Neck supple. No thyromegaly present.   Cardiovascular: Normal rate, regular rhythm, normal heart sounds and intact distal pulses. Exam reveals  no friction rub.   No murmur heard.  Pulmonary/Chest: Breath sounds normal. No respiratory distress. She has no wheezes. She has no rales.   Musculoskeletal:         General: No tenderness or edema.   Lymphadenopathy:     She has no cervical adenopathy.   Neurological: She is alert and oriented to person, place, and time.   Skin: Skin is warm and dry.   Psychiatric: Affect normal.   Diabetic foot exam:  Both feet without edema or ulcerations. Pulses normal bilaterally. Sensation normal bilaterally          Data reviewed:    Assessment/Plan:    ICD-10-CM    1. Type 2 diabetes mellitus (CMS HCC)  E11.9 COMPREHENSIVE METABOLIC PNL, FASTING     HGA1C (HEMOGLOBIN A1C WITH EST AVG GLUCOSE)     CBC     MICROALBUMIN URINE, RANDOM     LIPID PANEL     THYROID STIMULATING HORMONE (SENSITIVE TSH)   2. RLS (restless legs syndrome)  G25.81    3. Anxiety  F41.9    4. History of depression  Z86.59    5. Essential hypertension  I10 COMPREHENSIVE METABOLIC PNL, FASTING     HGA1C (HEMOGLOBIN A1C WITH EST AVG GLUCOSE)     CBC     MICROALBUMIN URINE, RANDOM     LIPID PANEL     THYROID STIMULATING HORMONE (SENSITIVE TSH)   6. 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 Hyperlipidemia, mixed  E78.2    8. Hypothyroidism, unspecified type  E03.9             Plan: Patient is to f/u asap if she has any problems. She was given forms to keep track of her BP,pules and sugars. She is to check about getting alpha lipoic acid for her neuropathy. Controlled substance form filled out.     Orders Placed This Encounter   . COMPREHENSIVE METABOLIC PNL, FASTING   . HGA1C (HEMOGLOBIN A1C WITH EST AVG GLUCOSE)   . CBC   . MICROALBUMIN URINE, RANDOM   . LIPID PANEL   . THYROID STIMULATING HORMONE (SENSITIVE TSH)   . URIC ACID         Follow up:  Return in about 4 months (around 05/20/2019).    This note was partially generated using MModal Fluency Direct system, and there may be some incorrect words, spellings, and punctuation that were not noted in checking the  note before saving.    Leonie Man, MD  01/18/2019, 14:47

## 2019-01-18 NOTE — Progress Notes (Signed)
Family Medicine, [facility]  [place] [address]  [phone]      Patient Name:  ALAURA Crawford  MRN:  [id number]  DOB:  16-Aug-1955    Date of Service:  01/19/2019    Chief Complaint:   Chief Complaint   Patient presents with   . Multiple medical problems follow up       Subjective:  Gwendolyn Crawford is a 63 y.o. female who returns for a f/u apt. She states that she has had her eye exam and her flu vaccine. She is to check with the health dept about her tetanus. She is to also check with them about her Shingrix. She has had her mammogram and colonoscopy. She has not been able to find the alpha lipoic acid.       Review of Systems   Constitutional: Negative for fever.   HENT: Negative for sore throat.    Eyes: Negative for blurred vision.   Respiratory: Negative for cough and shortness of breath.    Cardiovascular: Negative for chest pain.   Gastrointestinal: Negative for abdominal pain, nausea and vomiting.   Genitourinary: Negative for dysuria.   Musculoskeletal: Negative for falls.   Skin: Negative for rash.   Neurological: Negative for headaches.       Current Outpatient Medications   Medication Sig   . acetaminophen (TYLENOL) 325 mg Oral Tablet Take 325 mg by mouth Every 4 hours as needed for Pain   . allopurinoL (ZYLOPRIM) 100 mg Oral Tablet Take 100 mg by mouth Twice daily    . alpha lipoic acid 600 mg Oral Tablet Take by mouth Twice daily   . ascorbic acid (VITAMIN C) 1,000 mg Oral Tablet Take 1,000 mg by mouth Once a day   . aspirin (ECOTRIN) 81 mg Oral Tablet, Delayed Release (E.C.) Take 81 mg by mouth Once a day   . atorvastatin (LIPITOR) 10 mg Oral Tablet Take 1 Tab (10 mg total) by mouth Once a day for 90 days   . biotin 1 mg Oral Capsule Take by mouth   . Biotin 2,500 mcg Oral Capsule Take 2 Each by mouth Once a day Take 2 capsules daily    . cholecalciferol, vitamin D3, 25 mcg (1,000 unit) Oral Tablet Take 1,000 Units by mouth Once a day   . cinnamon bark  (CINNAMON ORAL) Take 1,000 mg by mouth bid   . cyanocobalamin (VITAMIN B 12) 1,000 mcg Oral Tablet Take 1,000 mcg by mouth Once a day   . DULoxetine (CYMBALTA DR) 60 mg Oral Capsule, Delayed Release(E.C.) Take 60 mg by mouth Once a day    . gabapentin (NEURONTIN) 600 mg Oral Tablet Take 1 Tab (600 mg total) by mouth Three times a day   . glimepiride (AMARYL) 1 mg Oral Tablet Take 0.5 mg by mouth    . levothyroxine (SYNTHROID) 100 mcg Oral Tablet Take 1 Tab (100 mcg total) by mouth Every morning   . lisinopriL (PRINIVIL) 5 mg Oral Tablet Take 5 mg by mouth Once a day    . loratadine (CLARITIN) 10 mg Oral Tablet Take 10 mg by mouth Once a day   . meclizine (ANTIVERT) 25 mg Oral Tablet Take 25 mg by mouth Every 8 hours as needed    . MetFORMIN (GLUCOPHAGE) 1,000 mg Oral Tablet Take 1,000 mg by mouth Twice daily with food    . multivit with minerals/lutein (MULTIVITAMIN 50 PLUS ORAL) Take by mouth   .  naproxen Sodium (ALEVE) 220 mg Oral Tablet Take 220 mg by mouth Every 12 hours as needed    . triamcinolone acetonide (ARISTOCORT) 0.5 % Cream        Allergies   Allergen Reactions   . Zocor [Simvastatin] Myalgia   . Crestor [Rosuvastatin]  Other Adverse Reaction (Add comment) and Myalgia     unknown       Objective:    BP 130/80   Pulse 91   Temp 36.3 C (97.3 F)   Resp 20   Ht 1.626 m (5\' 4" )   Wt 98.7 kg (217 lb 9.6 oz)   SpO2 97%   BMI 37.35 kg/m       Physical Exam   Constitutional: She is oriented to person, place, and time and well-developed, well-nourished, and in no distress.   HENT:   Head: Normocephalic and atraumatic.   Right Ear: External ear normal.   Left Ear: External ear normal.   Mouth/Throat: No oropharyngeal exudate.   Right tm partially obscured by cerumen on the right.   Eyes: Pupils are equal, round, and reactive to light. Conjunctivae and EOM are normal. Right eye exhibits no discharge. Left eye exhibits no discharge.   Neck: Normal range of motion. Neck supple. No thyromegaly present.      Cardiovascular: Normal rate, regular rhythm, normal heart sounds and intact distal pulses. Exam reveals no friction rub.   No murmur heard.  Pulmonary/Chest: Breath sounds normal. No respiratory distress. She has no wheezes. She has no rales.   Musculoskeletal:         General: No tenderness or edema.   Lymphadenopathy:     She has no cervical adenopathy.   Neurological: She is alert and oriented to person, place, and time.   Skin: Skin is warm and dry.   Psychiatric: Affect normal.   Diabetic foot exam:  Both feet without edema or ulcerations. Pulses normal bilaterally. Sensation normal bilaterally          Data reviewed:    Assessment/Plan:    ICD-10-CM    1. Type 2 diabetes mellitus (CMS HCC)  E11.9 COMPREHENSIVE METABOLIC PNL, FASTING     HGA1C (HEMOGLOBIN A1C WITH EST AVG GLUCOSE)     CBC     MICROALBUMIN URINE, RANDOM     LIPID PANEL     THYROID STIMULATING HORMONE (SENSITIVE TSH)     COMPREHENSIVE METABOLIC PNL, FASTING     HGA1C (HEMOGLOBIN A1C WITH EST AVG GLUCOSE)     CBC     MICROALBUMIN URINE, RANDOM     LIPID PANEL     THYROID STIMULATING HORMONE (SENSITIVE TSH)   2. RLS (restless legs syndrome)  G25.81    3. Anxiety  F41.9    4. History of depression  Z86.59    5. Essential hypertension  I10 COMPREHENSIVE METABOLIC PNL, FASTING     HGA1C (HEMOGLOBIN A1C WITH EST AVG GLUCOSE)     CBC     MICROALBUMIN URINE, RANDOM     LIPID PANEL     THYROID STIMULATING HORMONE (SENSITIVE TSH)     COMPREHENSIVE METABOLIC PNL, FASTING     HGA1C (HEMOGLOBIN A1C WITH EST AVG GLUCOSE)     CBC     MICROALBUMIN URINE, RANDOM     LIPID PANEL     THYROID STIMULATING HORMONE (SENSITIVE TSH)   6. 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 Hyperlipidemia, mixed  E78.2    8. Hypothyroidism,  unspecified type  E03.9             Plan:  Patient was given a card to keep track of her BP,pulse and sugars. She is to check with the health dept as discussed. She is to f/u asap if she has any problems. Her insurance labs were drawn.      Orders Placed This Encounter   . COMPREHENSIVE METABOLIC PNL, FASTING   . HGA1C (HEMOGLOBIN A1C WITH EST AVG GLUCOSE)   . CBC   . MICROALBUMIN URINE, RANDOM   . LIPID PANEL   . THYROID STIMULATING HORMONE (SENSITIVE TSH)   . URIC ACID   . atorvastatin (LIPITOR) 10 mg Oral Tablet         Follow up:  Return in about 4 months (around 05/20/2019).    This note was partially generated using MModal Fluency Direct system, and there may be some incorrect words, spellings, and punctuation that were not noted in checking the note before saving.    Leonie ManSunita Najai Waszak, MD  01/18/2019, 14:47

## 2019-01-19 ENCOUNTER — Ambulatory Visit: Payer: 59 | Attending: FAMILY MEDICINE | Admitting: FAMILY MEDICINE

## 2019-01-19 ENCOUNTER — Other Ambulatory Visit: Payer: Self-pay

## 2019-01-19 ENCOUNTER — Encounter (INDEPENDENT_AMBULATORY_CARE_PROVIDER_SITE_OTHER): Payer: Self-pay | Admitting: FAMILY MEDICINE

## 2019-01-19 VITALS — BP 130/80 | HR 91 | Temp 97.3°F | Resp 20 | Ht 64.0 in | Wt 217.6 lb

## 2019-01-19 DIAGNOSIS — G2581 Restless legs syndrome: Secondary | ICD-10-CM | POA: Insufficient documentation

## 2019-01-19 DIAGNOSIS — I10 Essential (primary) hypertension: Secondary | ICD-10-CM | POA: Insufficient documentation

## 2019-01-19 DIAGNOSIS — F419 Anxiety disorder, unspecified: Secondary | ICD-10-CM | POA: Insufficient documentation

## 2019-01-19 DIAGNOSIS — E119 Type 2 diabetes mellitus without complications: Secondary | ICD-10-CM | POA: Insufficient documentation

## 2019-01-19 DIAGNOSIS — Z8659 Personal history of other mental and behavioral disorders: Secondary | ICD-10-CM

## 2019-01-19 DIAGNOSIS — F329 Major depressive disorder, single episode, unspecified: Secondary | ICD-10-CM | POA: Insufficient documentation

## 2019-01-19 DIAGNOSIS — Z7984 Long term (current) use of oral hypoglycemic drugs: Secondary | ICD-10-CM | POA: Insufficient documentation

## 2019-01-19 DIAGNOSIS — E039 Hypothyroidism, unspecified: Secondary | ICD-10-CM | POA: Insufficient documentation

## 2019-01-19 DIAGNOSIS — E782 Mixed hyperlipidemia: Secondary | ICD-10-CM | POA: Insufficient documentation

## 2019-01-19 DIAGNOSIS — E790 Hyperuricemia without signs of inflammatory arthritis and tophaceous disease: Secondary | ICD-10-CM | POA: Insufficient documentation

## 2019-01-19 LAB — CBC
HCT: 45 % (ref 34.8–46.0)
HGB: 14.3 g/dL (ref 11.5–16.0)
MCH: 30 pg (ref 26.0–32.0)
MCHC: 31.8 g/dL (ref 31.0–35.5)
MCV: 94.5 fL (ref 78.0–100.0)
MPV: 12.8 fL — ABNORMAL HIGH (ref 8.7–12.5)
PLATELETS: 243 10*3/uL (ref 150–400)
RBC: 4.76 10*6/uL (ref 3.85–5.22)
RDW-CV: 13.2 % (ref 11.5–15.5)
WBC: 6.4 10*3/uL (ref 3.7–11.0)

## 2019-01-19 LAB — COMPREHENSIVE METABOLIC PNL, FASTING
ALBUMIN: 4.6 g/dL (ref 3.5–5.0)
ALKALINE PHOSPHATASE: 75 U/L (ref 38–126)
ALT (SGPT): 32 U/L (ref 0–34)
ANION GAP: 11 mmol/L (ref 8–16)
AST (SGOT): 37 U/L — ABNORMAL HIGH (ref 14–36)
BILIRUBIN TOTAL: 1.1 mg/dL (ref 0.2–1.3)
BUN/CREA RATIO: 20
BUN: 14 mg/dL (ref 7–17)
CALCIUM: 10.5 mg/dL — ABNORMAL HIGH (ref 8.4–10.2)
CHLORIDE: 101 mmol/L (ref 98–107)
CO2 TOTAL: 27 mmol/L (ref 22–30)
CREATININE: 0.7 mg/dL (ref 0.52–1.04)
ESTIMATED GFR: >60 mL/min/{1.73_m2} (ref >60)
GLUCOSE: 118 mg/dL — ABNORMAL HIGH (ref 74–106)
POTASSIUM: 4.4 mmol/L (ref 3.5–5.1)
PROTEIN TOTAL: 7.3 g/dL (ref 6.3–8.2)
SODIUM: 139 mmol/L (ref 137–145)

## 2019-01-19 LAB — LIPID PANEL
CHOLESTEROL: 173 mg/dL (ref 0–199)
HDL CHOL: 50 mg/dL (ref 40–59)
LDL CALC: 84 mg/dL (ref 0–99)
TRIGLYCERIDES: 195 mg/dL — ABNORMAL HIGH (ref 0–149)
VLDL CALC: 39 mg/dL (ref 0–50)

## 2019-01-19 LAB — HGA1C (HEMOGLOBIN A1C WITH EST AVG GLUCOSE)
ESTIMATED AVERAGE GLUCOSE: 177 mg/dL
HEMOGLOBIN A1C: 7.8 % — ABNORMAL HIGH (ref 4.0–6.0)

## 2019-01-19 LAB — MICROALBUMIN URINE, RANDOM: MICROALBUMIN RANDOM URINE: <0.6 mg/dL (ref 0.0–1.7)

## 2019-01-19 LAB — THYROID STIMULATING HORMONE (SENSITIVE TSH): TSH: 2.35 u[IU]/mL (ref 0.470–4.680)

## 2019-01-19 LAB — URIC ACID: URIC ACID: 5.3 mg/dL (ref 2.5–6.2)

## 2019-01-19 MED ORDER — ATORVASTATIN 10 MG TABLET
10.0000 mg | ORAL_TABLET | Freq: Every day | ORAL | 1 refills | Status: DC
Start: 2019-01-19 — End: 2019-05-19

## 2019-01-19 NOTE — Nursing Note (Signed)
Pt. Here for recheck. No new problems reported.Pt. needs refill on atorvastatin. Michelene Heady, RN

## 2019-01-21 ENCOUNTER — Other Ambulatory Visit (INDEPENDENT_AMBULATORY_CARE_PROVIDER_SITE_OTHER): Payer: Self-pay | Admitting: FAMILY MEDICINE

## 2019-01-21 DIAGNOSIS — E119 Type 2 diabetes mellitus without complications: Secondary | ICD-10-CM

## 2019-02-03 ENCOUNTER — Other Ambulatory Visit (INDEPENDENT_AMBULATORY_CARE_PROVIDER_SITE_OTHER): Payer: Self-pay | Admitting: FAMILY MEDICINE

## 2019-02-10 ENCOUNTER — Ambulatory Visit: Payer: 59 | Attending: FAMILY MEDICINE

## 2019-02-10 ENCOUNTER — Other Ambulatory Visit: Payer: Self-pay

## 2019-02-10 DIAGNOSIS — E119 Type 2 diabetes mellitus without complications: Secondary | ICD-10-CM | POA: Insufficient documentation

## 2019-02-10 LAB — BASIC METABOLIC PANEL, FASTING
ANION GAP: 9 mmol/L (ref 8–16)
BUN/CREA RATIO: 19
BUN: 13 mg/dL (ref 7–17)
CALCIUM: 9.8 mg/dL (ref 8.4–10.2)
CHLORIDE: 100 mmol/L (ref 98–107)
CO2 TOTAL: 30 mmol/L (ref 22–30)
CREATININE: 0.7 mg/dL (ref 0.52–1.04)
ESTIMATED GFR: >60 mL/min/{1.73_m2} (ref >60)
GLUCOSE: 164 mg/dL — ABNORMAL HIGH (ref 74–106)
POTASSIUM: 4.3 mmol/L (ref 3.5–5.1)
SODIUM: 139 mmol/L (ref 137–145)

## 2019-02-10 LAB — AST (SGOT): AST (SGOT): 30 U/L (ref 14–36)

## 2019-02-10 NOTE — Nursing Note (Signed)
Labs drawn

## 2019-03-04 ENCOUNTER — Other Ambulatory Visit (INDEPENDENT_AMBULATORY_CARE_PROVIDER_SITE_OTHER): Payer: Self-pay | Admitting: FAMILY MEDICINE

## 2019-04-06 ENCOUNTER — Other Ambulatory Visit (INDEPENDENT_AMBULATORY_CARE_PROVIDER_SITE_OTHER): Payer: Self-pay | Admitting: FAMILY MEDICINE

## 2019-05-19 ENCOUNTER — Other Ambulatory Visit (INDEPENDENT_AMBULATORY_CARE_PROVIDER_SITE_OTHER): Payer: Self-pay | Admitting: FAMILY MEDICINE

## 2019-05-19 NOTE — Progress Notes (Signed)
Family Medicine, [facility]  [address]  [place] [address]  [phone]      Patient Name:  Gwendolyn Crawford  MRN:  [id number]  DOB:  1955-02-24    Date of Service:  05/20/2019    Chief Complaint:   Chief Complaint   Patient presents with    Needs Pap    Follow-up     regular check up        Subjective:  Gwendolyn Crawford is a 64 y.o. female who returns for a f/u apt. She states that she has had both her COVID, Pfizer vaccines with the second 04/16/19. She states that her sugars have been upto 190. She states that her BP has been fine at home. Patient denies any melena,hematochezia or change in bowel habits. She denies any family hx of colon cancer, polyps,breast cancer or gyn cancer. She does self breast exams.      Past Medical History:  Past Medical History:   Diagnosis Date    Allergic rhinitis     Anxiety     Arthritis     Depression     Diabetes mellitus, type 2 (CMS HCC)     Gout     Headache     Hypercholesterolemia     Hypertension     Hypothyroid     Hypothyroidism     Insomnia     PONV (postoperative nausea and vomiting)     RLS (restless legs syndrome)     Type 2 diabetes mellitus (CMS HCC)     Vitamin deficiency     Wears glasses             Past Surgical History:  Past Surgical History:   Procedure Laterality Date    COLONOSCOPY      HX BREAST BIOPSY      HX BENIGN BREAST BX UNSURE WHICH SIDE    HX CHOLECYSTECTOMY      HX UPPER ENDOSCOPY              Review of Systems:  Review of Systems   Constitutional: Negative for fever.   HENT: Negative for sore throat.    Eyes: Negative for blurred vision.   Respiratory: Negative for cough and shortness of breath.    Cardiovascular: Negative for chest pain.   Gastrointestinal: Negative for abdominal pain, nausea and vomiting.   Genitourinary: Negative for dysuria.   Musculoskeletal: Negative for falls.   Skin: Negative for rash.   Neurological: Negative for headaches.         Current Outpatient Medications      Medication Sig    acetaminophen (TYLENOL) 325 mg Oral Tablet Take 325 mg by mouth Every 4 hours as needed for Pain    allopurinoL (ZYLOPRIM) 100 mg Oral Tablet Take 100 mg by mouth Twice daily     alpha lipoic acid 600 mg Oral Tablet Take by mouth Twice daily    ascorbic acid (VITAMIN C) 1,000 mg Oral Tablet Take 1,000 mg by mouth Once a day    aspirin (ECOTRIN) 81 mg Oral Tablet, Delayed Release (E.C.) Take 81 mg by mouth Once a day    atorvastatin (LIPITOR) 10 mg Oral Tablet Take 1 Tab (10 mg total) by mouth Once a day for 90 days    biotin 1 mg Oral Capsule Take by mouth    Biotin 2,500 mcg Oral Capsule Take 2 Each by mouth Once a day Take 2 capsules daily     cholecalciferol,  vitamin D3, 25 mcg (1,000 unit) Oral Tablet Take 1,000 Units by mouth Once a day    cinnamon bark (CINNAMON ORAL) Take 1,000 mg by mouth bid    cyanocobalamin (VITAMIN B 12) 1,000 mcg Oral Tablet Take 1,000 mcg by mouth Once a day    DULoxetine (CYMBALTA DR) 30 mg Oral Capsule, Delayed Release(E.C.) Take 1 Capsule (30 mg total) by mouth Once a day for 90 days    gabapentin (NEURONTIN) 600 mg Oral Tablet Take 1 Tab (600 mg total) by mouth Three times a day    glimepiride (AMARYL) 1 mg Oral Tablet TAKE 1/2 TABLET BY MOUTH EVERY DAY FOR BLOOD SUGAR    levothyroxine (SYNTHROID) 100 mcg Oral Tablet Take 1 Tab (100 mcg total) by mouth Every morning    lisinopriL (PRINIVIL) 5 mg Oral Tablet Take 5 mg by mouth Once a day     loratadine (CLARITIN) 10 mg Oral Tablet Take 10 mg by mouth Once a day    meclizine (ANTIVERT) 25 mg Oral Tablet Take 25 mg by mouth Every 8 hours as needed     MetFORMIN (GLUCOPHAGE) 1,000 mg Oral Tablet TAKE ONE TABLET BY MOUTH EVERY MORNING AND EVERY EVENING WITH FOOD FOR BLOOD SUGAR    multivit with minerals/lutein (MULTIVITAMIN 50 PLUS ORAL) Take by mouth    naproxen Sodium (ALEVE) 220 mg Oral Tablet Take 220 mg by mouth Every 12 hours as needed     triamcinolone acetonide (ARISTOCORT) 0.5 % Cream         Allergies   Allergen Reactions    Zocor [Simvastatin] Myalgia    Crestor [Rosuvastatin]  Other Adverse Reaction (Add comment) and Myalgia     unknown       Objective:    BP (!) 142/86 (Site: Left, Patient Position: Sitting, Cuff Size: Adult Small)    Pulse 85    Temp 36.7 C (98.1 F)    Resp 18    Ht 1.626 m (5\' 4" )    Wt 98.6 kg (217 lb 6.4 oz)    SpO2 99%    BMI 37.32 kg/m       Body mass index is 37.32 kg/m.     Physical Exam:  Constitutional: She is oriented to person, place, and time and well-developed, well-nourished, and in no distress.   HENT:   Head: Normocephalic.   Eyes: Pupils are equal, round, and reactive to light. Conjunctivae and EOM are normal. Right eye exhibits no discharge. Left eye exhibits no discharge.   Neck: Normal range of motion. Neck supple. No thyromegaly present.   Cardiovascular: Normal rate, regular rhythm, normal heart sounds and intact distal pulses. Exam reveals no friction rub.   No murmur heard.  Pulmonary/Chest: Breath sounds normal. No respiratory distress. She has no wheezes. She has no rales.   Musculoskeletal:      Breast:No bilateral masses,adenopathy,retractions,discharge bilaterally     General: No tenderness or edema.   Lymphadenopathy:     She has no cervical adenopathy.   Neurological: She is alert and oriented to person, place, and time.   Skin: Skin is warm and dry. Dry placques intermittent over body(followed by Deloris Lucky;seen 2-3 months ago and will set up f/u)  Psychiatric: Affect normal.    GYN: External genitalia normal. Small cervical polyp;friable; in canal. No CMT. No gross tenderness to palpation uterus or adenexa.  Rectal:Guaiac positive, brown stool.     Data reviewed:  HEMOGLOBIN A1C  Lab Results   Component Value Date  HA1C 7.8 (H) 01/19/2019     COMPLETE BLOOD COUNT   Lab Results   Component Value Date    WBC 6.4 01/19/2019    HGB 14.3 01/19/2019    HCT 45.0 01/19/2019    PLTCNT 243 01/19/2019       DIFFERENTIAL  Lab Results   Component  Value Date    PMNS 53 05/12/2018    MONOCYTES 6 05/12/2018    BASOPHILS 0 05/12/2018    BASOPHILS <0.10 05/12/2018    PMNABS 2.96 05/12/2018    LYMPHSABS 2.17 05/12/2018    EOSABS 0.18 05/12/2018    MONOSABS 0.33 05/12/2018     COMPREHENSIVE METABOLIC PANEL  Lab Results   Component Value Date    SODIUM 139 02/10/2019    POTASSIUM 4.3 02/10/2019    CHLORIDE 100 02/10/2019    CO2 30 02/10/2019    ANIONGAP 9 02/10/2019    BUN 13 02/10/2019    CREATININE 0.70 02/10/2019    GLUCOSENF 164 (H) 02/10/2019    CALCIUM 9.8 02/10/2019    ALBUMIN 4.6 01/19/2019    TOTALPROTEIN 7.3 01/19/2019    ALKPHOS 75 01/19/2019    AST 30 02/10/2019    ALT 32 01/19/2019     LIPID PROFILE  Lab Results   Component Value Date    CHOLESTEROL 173 01/19/2019    HDLCHOL 50 01/19/2019    LDLCHOL 84 01/19/2019    TRIG 195 (H) 01/19/2019     THYROID STIMULATING HORMONE  Lab Results   Component Value Date    TSH 2.350 01/19/2019        Recent Results (from the past 17520 hour(s))   MAMMO BILATERAL SCREENING-ADDL VIEWS/BREAST US AS REQ BY RAD    Collection Time: 10/27/18 10:17 AM    Narrative    Computer aided detection (CAD) technology has been applied to the standard   (CC and MLO) images.    2D images were acquired and reviewed.    FILMS COMPARED:  Compared to: 10/24/2017 MAMMO BILAT SCREENING WO TOMO-ADDL VIEWS/BREAST US   AS REQ BY RAD    HISTORY:  Procedure: MAMMO BILATERAL SCREENING WO TOMO-ADDL VIEWS/BREAST US AS REQ   BY RAD  Reason for exam: Breast cancer screening by mammogram    FINDINGS:  The breasts have scattered areas of fibroglandular density.    Bilateral  There is no evidence of suspicious masses, calcifications, or other   abnormal findings.      Impression    Follow up mammogram in 1 year is recommended for both breasts.    BI-RADS ATLAS category (overall): 2 Benign        Social Determinants identified with potential adverse effects on health outcomes:  none           Assessment/Plan:    ICD-10-CM    1. Well woman exam  Z01.419       2. Type 2 diabetes mellitus (CMS HCC)  E11.9 HEPATITIS C ANTIBODY SCREEN WITH REFLEX TO HCV PCR     HIV1/HIV2 SCREEN, COMBINED ANTIGEN AND ANTIBODY     HGA1C (HEMOGLOBIN A1C WITH EST AVG GLUCOSE)     BASIC METABOLIC PANEL, FASTING     THYROID STIMULATING HORMONE (SENSITIVE TSH)     HEPATITIS C ANTIBODY SCREEN WITH REFLEX TO HCV PCR     HIV1/HIV2 SCREEN, COMBINED ANTIGEN AND ANTIBODY     HGA1C (HEMOGLOBIN A1C WITH EST AVG GLUCOSE)     BASIC METABOLIC PANEL, FASTING     THYROID STIMULATING HORMONE (SENSITIVE  TSH)   3. Acquired hypothyroidism  E03.9 THYROID STIMULATING HORMONE (SENSITIVE TSH)     THYROID STIMULATING HORMONE (SENSITIVE TSH)   4. Guaiac + stool  R19.5 Refer to [facility]     CBC     CBC   5. Hyperlipidemia, mixed  E78.2    6. Essential hypertension  I10    7. RLS (restless legs syndrome)  G25.81    8. History of depression  Z86.59    9. Anxiety  F41.9          Plan:  Will have the patient see surgery regarding her guaiac positive stool. She was given cards to keep track of her weight,bp and sugars. She has had some diaphoresis and her dose of Cymbalta was decreased to 30mg  po daily.       Orders Placed This Encounter    HEPATITIS C ANTIBODY SCREEN WITH REFLEX TO HCV PCR    HIV1/HIV2 SCREEN, COMBINED ANTIGEN AND ANTIBODY    HGA1C (HEMOGLOBIN A1C WITH EST AVG GLUCOSE)    BASIC METABOLIC PANEL, FASTING    THYROID STIMULATING HORMONE (SENSITIVE TSH)    CBC    Refer to [facility]    DULoxetine (CYMBALTA DR) 30 mg Oral Capsule, Delayed Release(E.C.)         Follow up:  No follow-ups on file.    This note was partially generated using MModal Fluency Direct system, and there may be some incorrect words, spellings, and punctuation that were not noted in checking the note before saving.    , MD  05/19/2019, 14:45

## 2019-05-20 ENCOUNTER — Other Ambulatory Visit: Payer: 59 | Attending: FAMILY MEDICINE | Admitting: FAMILY MEDICINE

## 2019-05-20 ENCOUNTER — Other Ambulatory Visit: Payer: Self-pay

## 2019-05-20 ENCOUNTER — Encounter (INDEPENDENT_AMBULATORY_CARE_PROVIDER_SITE_OTHER): Payer: Self-pay | Admitting: FAMILY MEDICINE

## 2019-05-20 ENCOUNTER — Ambulatory Visit: Payer: 59 | Attending: FAMILY MEDICINE | Admitting: FAMILY MEDICINE

## 2019-05-20 ENCOUNTER — Other Ambulatory Visit (INDEPENDENT_AMBULATORY_CARE_PROVIDER_SITE_OTHER): Payer: Self-pay

## 2019-05-20 VITALS — BP 142/86 | HR 85 | Temp 98.1°F | Resp 18 | Ht 64.0 in | Wt 217.4 lb

## 2019-05-20 DIAGNOSIS — G2581 Restless legs syndrome: Secondary | ICD-10-CM | POA: Insufficient documentation

## 2019-05-20 DIAGNOSIS — I10 Essential (primary) hypertension: Secondary | ICD-10-CM | POA: Insufficient documentation

## 2019-05-20 DIAGNOSIS — Z01419 Encounter for gynecological examination (general) (routine) without abnormal findings: Secondary | ICD-10-CM | POA: Insufficient documentation

## 2019-05-20 DIAGNOSIS — R195 Other fecal abnormalities: Secondary | ICD-10-CM | POA: Insufficient documentation

## 2019-05-20 DIAGNOSIS — Z1159 Encounter for screening for other viral diseases: Secondary | ICD-10-CM | POA: Insufficient documentation

## 2019-05-20 DIAGNOSIS — Z7984 Long term (current) use of oral hypoglycemic drugs: Secondary | ICD-10-CM | POA: Insufficient documentation

## 2019-05-20 DIAGNOSIS — E782 Mixed hyperlipidemia: Secondary | ICD-10-CM | POA: Insufficient documentation

## 2019-05-20 DIAGNOSIS — E119 Type 2 diabetes mellitus without complications: Secondary | ICD-10-CM | POA: Insufficient documentation

## 2019-05-20 DIAGNOSIS — F329 Major depressive disorder, single episode, unspecified: Secondary | ICD-10-CM | POA: Insufficient documentation

## 2019-05-20 DIAGNOSIS — Z114 Encounter for screening for human immunodeficiency virus [HIV]: Secondary | ICD-10-CM | POA: Insufficient documentation

## 2019-05-20 DIAGNOSIS — E039 Hypothyroidism, unspecified: Secondary | ICD-10-CM | POA: Insufficient documentation

## 2019-05-20 DIAGNOSIS — F419 Anxiety disorder, unspecified: Secondary | ICD-10-CM | POA: Insufficient documentation

## 2019-05-20 DIAGNOSIS — Z8659 Personal history of other mental and behavioral disorders: Secondary | ICD-10-CM

## 2019-05-20 LAB — LDH: LDH: 382 U/L — ABNORMAL HIGH (ref 125–220)

## 2019-05-20 LAB — CBC
HCT: 45.1 % (ref 34.8–46.0)
HGB: 14.8 g/dL (ref 11.5–16.0)
MCH: 30.8 pg (ref 26.0–32.0)
MCHC: 32.8 g/dL (ref 31.0–35.5)
MCV: 93.8 fL (ref 78.0–100.0)
MPV: 12.2 fL (ref 8.7–12.5)
PLATELETS: 250 10*3/uL (ref 150–400)
RBC: 4.81 10*6/uL (ref 3.85–5.22)
RDW-CV: 13.2 % (ref 11.5–15.5)
WBC: 7.2 10*3/uL (ref 3.7–11.0)

## 2019-05-20 LAB — BASIC METABOLIC PANEL, FASTING
ANION GAP: 12 mmol/L (ref 4–13)
BUN/CREA RATIO: 16 (ref 6–22)
BUN: 13 mg/dL (ref 8–25)
CALCIUM: 10.5 mg/dL — ABNORMAL HIGH (ref 8.8–10.2)
CHLORIDE: 100 mmol/L (ref 96–111)
CO2 TOTAL: 25 mmol/L (ref 23–31)
CREATININE: 0.8 mg/dL (ref 0.60–1.05)
ESTIMATED GFR: 78 mL/min/BSA (ref >=60)
GLUCOSE: 157 mg/dL — ABNORMAL HIGH (ref 65–125)
POTASSIUM: 3.9 mmol/L (ref 3.5–5.1)
SODIUM: 137 mmol/L (ref 136–145)

## 2019-05-20 LAB — HIV1/HIV2 SCREEN, COMBINED ANTIGEN AND ANTIBODY: HIV SCREEN, COMBINED ANTIGEN & ANTIBODY: NEGATIVE

## 2019-05-20 LAB — HGA1C (HEMOGLOBIN A1C WITH EST AVG GLUCOSE)
ESTIMATED AVERAGE GLUCOSE: 192 mg/dL
HEMOGLOBIN A1C: 8.3 % — ABNORMAL HIGH (ref 4.0–6.0)

## 2019-05-20 LAB — HEPATITIS C ANTIBODY SCREEN WITH REFLEX TO HCV PCR: HCV ANTIBODY QUALITATIVE: NEGATIVE

## 2019-05-20 LAB — THYROID STIMULATING HORMONE (SENSITIVE TSH): TSH: 3.231 u[IU]/mL (ref 0.430–3.550)

## 2019-05-20 MED ORDER — ATORVASTATIN 10 MG TABLET
10.0000 mg | ORAL_TABLET | Freq: Every day | ORAL | 1 refills | Status: DC
Start: 2019-05-20 — End: 2020-01-27

## 2019-05-20 MED ORDER — DULOXETINE 30 MG CAPSULE,DELAYED RELEASE
30.00 mg | DELAYED_RELEASE_CAPSULE | Freq: Every day | ORAL | 1 refills | Status: DC
Start: 2019-05-20 — End: 2019-08-13

## 2019-05-20 NOTE — Addendum Note (Signed)
Addended by: Leonie Man on: 05/20/2019 02:11 PM     Modules accepted: Orders

## 2019-05-20 NOTE — Nursing Note (Signed)
05/20/19 Pt here for regular check up and pap.

## 2019-05-21 ENCOUNTER — Telehealth (INDEPENDENT_AMBULATORY_CARE_PROVIDER_SITE_OTHER): Payer: Self-pay | Admitting: FAMILY MEDICINE

## 2019-05-21 DIAGNOSIS — R7402 Elevation of levels of lactic acid dehydrogenase (LDH): Secondary | ICD-10-CM

## 2019-05-21 NOTE — Telephone Encounter (Signed)
Please let this patient know that her LDH was slightly elevated as was her calcium at 10.5. If she is taking calcium supplementation have her stop it and repeat her BMP and LDH in 2 weeks. Also her A1c was 8.3 and should be less than 7. Remind her about diet,exercise and weight loss. Check to see how she is taking her diabetic meds. If she is taking Amaryl 1mg  half a tab increase it to a whole tab. If sugars drop down below 90 drop back down to a half.  Drop off her cards recording her sugars in 2-4 weeks.

## 2019-05-21 NOTE — Telephone Encounter (Signed)
I called patient    Discussed all of Dr Graylin Shiver lab message and med changes    Patjent understood.  She asked several questions and all answered.    Patient is to see MD and or ED sooner if needed and she agreed    Med list updated with Dr Graylin Shiver orders and patient asked for a refill to be ready when needed.    Randolm Idol, RN

## 2019-05-22 LAB — CYTOPATHOLOGY, GYN +/- HIGH RISK HPV

## 2019-05-22 MED ORDER — GLIMEPIRIDE 1 MG TABLET
1.00 mg | ORAL_TABLET | Freq: Every morning | ORAL | 1 refills | Status: DC
Start: 2019-05-22 — End: 2019-12-21

## 2019-06-04 ENCOUNTER — Ambulatory Visit: Payer: 59 | Attending: Surgery | Admitting: Surgery

## 2019-06-04 ENCOUNTER — Encounter (INDEPENDENT_AMBULATORY_CARE_PROVIDER_SITE_OTHER): Payer: Self-pay | Admitting: Surgery

## 2019-06-04 ENCOUNTER — Other Ambulatory Visit: Payer: Self-pay

## 2019-06-04 DIAGNOSIS — F419 Anxiety disorder, unspecified: Secondary | ICD-10-CM | POA: Insufficient documentation

## 2019-06-04 DIAGNOSIS — Z791 Long term (current) use of non-steroidal anti-inflammatories (NSAID): Secondary | ICD-10-CM | POA: Insufficient documentation

## 2019-06-04 DIAGNOSIS — R195 Other fecal abnormalities: Secondary | ICD-10-CM | POA: Insufficient documentation

## 2019-06-04 DIAGNOSIS — Z888 Allergy status to other drugs, medicaments and biological substances status: Secondary | ICD-10-CM | POA: Insufficient documentation

## 2019-06-04 DIAGNOSIS — M109 Gout, unspecified: Secondary | ICD-10-CM | POA: Insufficient documentation

## 2019-06-04 DIAGNOSIS — K5730 Diverticulosis of large intestine without perforation or abscess without bleeding: Secondary | ICD-10-CM | POA: Insufficient documentation

## 2019-06-04 DIAGNOSIS — I10 Essential (primary) hypertension: Secondary | ICD-10-CM | POA: Insufficient documentation

## 2019-06-04 DIAGNOSIS — J309 Allergic rhinitis, unspecified: Secondary | ICD-10-CM | POA: Insufficient documentation

## 2019-06-04 DIAGNOSIS — E039 Hypothyroidism, unspecified: Secondary | ICD-10-CM | POA: Insufficient documentation

## 2019-06-04 DIAGNOSIS — Z79899 Other long term (current) drug therapy: Secondary | ICD-10-CM | POA: Insufficient documentation

## 2019-06-04 DIAGNOSIS — F329 Major depressive disorder, single episode, unspecified: Secondary | ICD-10-CM | POA: Insufficient documentation

## 2019-06-04 DIAGNOSIS — Z7982 Long term (current) use of aspirin: Secondary | ICD-10-CM | POA: Insufficient documentation

## 2019-06-04 DIAGNOSIS — E119 Type 2 diabetes mellitus without complications: Secondary | ICD-10-CM | POA: Insufficient documentation

## 2019-06-04 DIAGNOSIS — Z7984 Long term (current) use of oral hypoglycemic drugs: Secondary | ICD-10-CM | POA: Insufficient documentation

## 2019-06-04 DIAGNOSIS — E7800 Pure hypercholesterolemia, unspecified: Secondary | ICD-10-CM | POA: Insufficient documentation

## 2019-06-04 DIAGNOSIS — G2581 Restless legs syndrome: Secondary | ICD-10-CM | POA: Insufficient documentation

## 2019-06-04 DIAGNOSIS — Z79890 Hormone replacement therapy (postmenopausal): Secondary | ICD-10-CM | POA: Insufficient documentation

## 2019-06-04 DIAGNOSIS — Z87891 Personal history of nicotine dependence: Secondary | ICD-10-CM | POA: Insufficient documentation

## 2019-06-04 DIAGNOSIS — M1990 Unspecified osteoarthritis, unspecified site: Secondary | ICD-10-CM | POA: Insufficient documentation

## 2019-06-04 NOTE — Progress Notes (Signed)
[facility]ULAT[facility]  GENERAL SURGERY, AMBULATORY CARE CENTER  [address]  [place] [address]  [phone]   General Surgery Progress Note    Date of Service:  06/04/2019  Gwendolyn Crawford, 65 y.o. female  Date of Birth:  1955/12/20  PCP: Leonie Man, MD  Referring:  Leonie Man     HPI:  Gwendolyn Crawford is a 64 y.o. White female who returns for evaluation today.  Patient apparently had occult stool testing for blood that was occult positive.  I am not sure of the rationale for the testing.  I believe was just routine screening testing.  Patient denies any abdominal pain or lower GI symptoms or complaints.  She denies any gross blood with bowel movements.  She denies any melanotic stools.  She had colonoscopy in September 2020 which demonstrated diverticulosis of the colon.    Past Medical History:   Diagnosis Date   . Allergic rhinitis    . Anxiety    . Arthritis    . Depression    . Diabetes mellitus, type 2 (CMS HCC)    . Gout    . Headache    . Hypercholesterolemia    . Hypertension    . Hypothyroid    . Hypothyroidism    . Insomnia    . PONV (postoperative nausea and vomiting)    . RLS (restless legs syndrome)    . Vitamin deficiency    . Wears glasses       Past Surgical History:   Procedure Laterality Date   . COLONOSCOPY     . ESOPHAGOGASTRODUODENOSCOPY     . HX BREAST BIOPSY      HX BENIGN BREAST BX UNSURE WHICH SIDE   . HX CHOLECYSTECTOMY        Social History     Tobacco Use   . Smoking status: Former Games developer   . Smokeless tobacco: Never Used   Vaping Use   . Vaping Use: Never used   Substance Use Topics   . Alcohol use: Not Currently   . Drug use: Never       Family Medical History:     Problem Relation (Age of Onset)    Alzheimer's/Dementia Mother, Sister    COPD Sister    Stroke Mother, Sister         Outpatient Medications Marked as Taking for the 06/04/19 encounter (Office Visit) with Cletis Athens, MD   Medication Sig   . acetaminophen (TYLENOL) 325 mg Oral Tablet Take  325 mg by mouth Every 4 hours as needed for Pain   . allopurinoL (ZYLOPRIM) 100 mg Oral Tablet Take 100 mg by mouth Twice daily    . ascorbic acid (VITAMIN C) 1,000 mg Oral Tablet Take 1,000 mg by mouth Once a day   . aspirin (ECOTRIN) 81 mg Oral Tablet, Delayed Release (E.C.) Take 81 mg by mouth Once a day   . atorvastatin (LIPITOR) 10 mg Oral Tablet Take 1 Tablet (10 mg total) by mouth Once a day for 90 days   . cholecalciferol, vitamin D3, 25 mcg (1,000 unit) Oral Tablet Take 1,000 Units by mouth Once a day   . cinnamon bark (CINNAMON ORAL) Take 1,000 mg by mouth bid   . cyanocobalamin (VITAMIN B 12) 1,000 mcg Oral Tablet Take 1,000 mcg by mouth Once a day   . DULoxetine (CYMBALTA DR) 30 mg Oral Capsule, Delayed Release(E.C.) Take 1 Capsule (30 mg total) by mouth Once a day for  90 days   . gabapentin (NEURONTIN) 600 mg Oral Tablet Take 1 Tab (600 mg total) by mouth Three times a day   . glimepiride (AMARYL) 1 mg Oral Tablet Take 1 Tablet (1 mg total) by mouth Every morning   . levothyroxine (SYNTHROID) 100 mcg Oral Tablet Take 1 Tab (100 mcg total) by mouth Every morning   . lisinopriL (PRINIVIL) 5 mg Oral Tablet Take 5 mg by mouth Once a day    . loratadine (CLARITIN) 10 mg Oral Tablet Take 10 mg by mouth Once a day   . meclizine (ANTIVERT) 25 mg Oral Tablet Take 25 mg by mouth Every 8 hours as needed    . MetFORMIN (GLUCOPHAGE) 1,000 mg Oral Tablet TAKE ONE TABLET BY MOUTH EVERY MORNING AND EVERY EVENING WITH FOOD FOR BLOOD SUGAR   . multivit with minerals/lutein (MULTIVITAMIN 50 PLUS ORAL) Take by mouth   . naproxen Sodium (ALEVE) 220 mg Oral Tablet Take 220 mg by mouth Every 12 hours as needed    . triamcinolone acetonide (ARISTOCORT) 0.5 % Cream       Allergies   Allergen Reactions   . Zocor [Simvastatin] Myalgia   . Crestor [Rosuvastatin]  Other Adverse Reaction (Add comment) and Myalgia     unknown        Review of Systems   Constitutional: Negative for chills and fever.   Respiratory: Negative for  shortness of breath.    Cardiovascular: Negative for chest pain.   Gastrointestinal: Negative for nausea and vomiting.   Skin: Negative for itching and rash.          Ht 1.626 m (5\' 4" )   Wt 98.4 kg (217 lb)   BMI 37.25 kg/m          Physical Exam  Exam conducted with a chaperone present.   Constitutional:       Appearance: Normal appearance.   HENT:      Head: Normocephalic and atraumatic.   Eyes:      Extraocular Movements: Extraocular movements intact.      Pupils: Pupils are equal, round, and reactive to light.   Pulmonary:      Effort: Pulmonary effort is normal. No respiratory distress.      Breath sounds: No stridor.   Abdominal:      General: There is no distension.      Palpations: Abdomen is soft.   Skin:     General: Skin is warm and dry.   Neurological:      General: No focal deficit present.      Mental Status: She is alert and oriented to person, place, and time.   Psychiatric:         Mood and Affect: Mood normal.         Behavior: Behavior normal.         Judgment: Judgment normal.          Data Review:       Ref Range & Units 2 wk ago 4 mo ago 1 yr ago   WBC   3.7 - 11.0 x10^3/uL 7.2  6.4  5.7    RBC   3.85 - 5.22 x10^6/uL 4.81  4.76  4.60    HGB   11.5 - 16.0 g/dL  49.4  49.6    HCT   34.8 - 46.0 % 45.1  45.0  44.8    MCV   78.0 - 100.0 fL 93.8  94.5  97.4    MCH  26.0 - 32.0 pg 30.8  30.0  30.0    MCHC   31.0 - 35.5 g/dL 32.8  31.8  30.8Low     RDW-CV   11.5 - 15.5 % 13.2  13.2  13.5    PLATELETS   150 - 400 x10^3/uL 250  243  243    MPV   8.7 - 12.5 fL 12.2  12.8High   12.[place] [facility]         Specimen Collected: 05/20/19 10:50 Last Resulted: 05/20/19 13:06            Assessment/Plan:  1. Guaiac + stool    2. Diverticulosis of colon         Patient has had a colonoscopy within the last year which demonstrated diverticulosis of the colon only.  Patient without gross bleeding.  Patient has no evidence for anemia.  Patient has no abdominal symptoms at  this time.  Colonoscopy is not indicated.  Follow-up in 6 months for re-evaluation.    Return in about 6 months (around 12/05/2019).     This note was partially created using voice recognition software and is inherently subject to errors including those of syntax and "sound alike " substitutions which may escape proof reading. In such instances, original meaning may be extrapolated by contextual derivation.    Royetta Crochet, MD

## 2019-06-14 ENCOUNTER — Telehealth (INDEPENDENT_AMBULATORY_CARE_PROVIDER_SITE_OTHER): Payer: Self-pay | Admitting: FAMILY MEDICINE

## 2019-06-14 NOTE — Telephone Encounter (Signed)
Please let patient know that her accuchecks are better. Have her continue to keep track of them and drop them off in 2-3 weeks. Continue to watch her diet and exercise.

## 2019-06-25 ENCOUNTER — Other Ambulatory Visit (INDEPENDENT_AMBULATORY_CARE_PROVIDER_SITE_OTHER): Payer: Self-pay | Admitting: FAMILY MEDICINE

## 2019-07-08 NOTE — Telephone Encounter (Signed)
Patient's accuchecks are better. Continue diet and exercise and drop off further testing like she does in 2-3 weeks.

## 2019-07-09 NOTE — Telephone Encounter (Signed)
Pt. Notified of your orders and she will try to drop off readings this week.  Thomasena Edis, RN

## 2019-07-24 ENCOUNTER — Other Ambulatory Visit (INDEPENDENT_AMBULATORY_CARE_PROVIDER_SITE_OTHER): Payer: Self-pay | Admitting: FAMILY MEDICINE

## 2019-08-13 ENCOUNTER — Other Ambulatory Visit (INDEPENDENT_AMBULATORY_CARE_PROVIDER_SITE_OTHER): Payer: Self-pay | Admitting: FAMILY MEDICINE

## 2019-09-20 NOTE — Progress Notes (Signed)
Family Medicine, [facility]  [address]  [place] [address]  [phone]      Patient Name:  Gwendolyn Crawford  MRN:  [id number]  DOB:  12-Feb-1955    Date of Service:  09/21/2019    Chief Complaint:   Chief Complaint   Patient presents with    Follow-up       Subjective:  Gwendolyn Crawford is a 64 y.o. female who returns for a f/u apt. She states that she still has diaphoresis only when she is active. She denies any night sweats. She states that her sugars and BP and have been fine. She would like her sugars to be better. She had her eyes check per Dr.Pendergast 6/21. She denies any exposure to T.B.     Past Medical History:  Past Medical History:   Diagnosis Date    Allergic rhinitis     Anxiety     Arthritis     Depression     Diabetes mellitus, type 2 (CMS HCC)     Gout     Headache     Hypercholesterolemia     Hypertension     Hypothyroid     Hypothyroidism     Insomnia     PONV (postoperative nausea and vomiting)     RLS (restless legs syndrome)     Vitamin deficiency     Wears glasses             Past Surgical History:  Past Surgical History:   Procedure Laterality Date    COLONOSCOPY      ESOPHAGOGASTRODUODENOSCOPY      HX BREAST BIOPSY      HX BENIGN BREAST BX UNSURE WHICH SIDE    HX CHOLECYSTECTOMY                        Current Outpatient Medications   Medication Sig    acetaminophen (TYLENOL) 325 mg Oral Tablet Take 325 mg by mouth Every 4 hours as needed for Pain    allopurinoL (ZYLOPRIM) 100 mg Oral Tablet TAKE ONE TABLET BY MOUTH TWO TIMES A DAY    ascorbic acid (VITAMIN C) 1,000 mg Oral Tablet Take 1,000 mg by mouth Once a day    aspirin (ECOTRIN) 81 mg Oral Tablet, Delayed Release (E.C.) Take 81 mg by mouth Once a day    atorvastatin (LIPITOR) 10 mg Oral Tablet Take 1 Tablet (10 mg total) by mouth Once a day for 90 days    cholecalciferol, vitamin D3, 25 mcg (1,000 unit) Oral Tablet Take 1,000 Units by mouth Once a day    cinnamon bark (CINNAMON  ORAL) Take 1,000 mg by mouth bid    cyanocobalamin (VITAMIN B 12) 1,000 mcg Oral Tablet Take 1,000 mcg by mouth Once a day    DULoxetine (CYMBALTA DR) 30 mg Oral Capsule, Delayed Release(E.C.) TAKE ONE CAPSULE BY MOUTH EVERY DAY    gabapentin (NEURONTIN) 600 mg Oral Tablet TAKE ONE TABLET BY MOUTH THREE TIMES A DAY    glimepiride (AMARYL) 1 mg Oral Tablet Take 1 Tablet (1 mg total) by mouth Every morning    levothyroxine (SYNTHROID) 100 mcg Oral Tablet Take 1 Tab (100 mcg total) by mouth Every morning    lisinopriL (PRINIVIL) 5 mg Oral Tablet Take 5 mg by mouth Once a day     loratadine (CLARITIN) 10 mg Oral Tablet Take 10 mg by mouth Once a day    meclizine (ANTIVERT)  25 mg Oral Tablet Take 25 mg by mouth Every 8 hours as needed     MetFORMIN (GLUCOPHAGE) 1,000 mg Oral Tablet TAKE ONE TABLET BY MOUTH EVERY MORNING AND EVERY EVENING WITH FOOD FOR BLOOD SUGAR    multivit with minerals/lutein (MULTIVITAMIN 50 PLUS ORAL) Take by mouth    naproxen Sodium (ALEVE) 220 mg Oral Tablet Take 220 mg by mouth Every 12 hours as needed     triamcinolone acetonide (ARISTOCORT) 0.5 % Cream        Allergies   Allergen Reactions    Zocor [Simvastatin] Myalgia    Crestor [Rosuvastatin]  Other Adverse Reaction (Add comment) and Myalgia     unknown       Objective:    BP 122/70 (Site: Left, Patient Position: Sitting, Cuff Size: Adult Large)    Pulse 74    Temp 36.9 C (98.5 F) (Oral)    Resp 16    Ht 1.626 m (5\' 4" )    Wt 95.8 kg (211 lb 3.2 oz)    SpO2 99%    BMI 36.25 kg/m       Body mass index is 36.25 kg/m.     Physical Exam:  Constitutional: she is oriented to person, place, and time and well-developed, well-nourished, and in no distress.   HENT: tm and canals clear left and mostly obscured by cerumen on the right.     Head: Normocephalic.   Eyes: Pupils are equal, round, and reactive to light. Conjunctivae and EOM are normal. Right eye exhibits no discharge. Left eye exhibits no discharge.   Neck: Normal range of  motion. Neck supple. No thyromegaly present.   Cardiovascular: Normal rate, regular rhythm, normal heart sounds and intact distal pulses. Exam reveals no friction rub.   No murmur heard.  Pulmonary/Chest: Breath sounds normal. No respiratory distress. she  has no wheezes. she  has no rales.   Musculoskeletal:         General: No tenderness or edema.   Lymphadenopathy:     she  has no cervical adenopathy.   Neurological she  is alert and oriented to person, place, and time.   Skin: Skin is warm and dry.   Psychiatric: Affect normal.    Diabetic foot exam:  No edema bilaterally. Pulses normal bilaterally.        Data reviewed:  HEMOGLOBIN A1C  Lab Results   Component Value Date    HA1C 8.3 (H) 05/20/2019     COMPLETE BLOOD COUNT   Lab Results   Component Value Date    WBC 7.2 05/20/2019    HGB 14.8 05/20/2019    HCT 45.1 05/20/2019    PLTCNT 250 05/20/2019       DIFFERENTIAL  Lab Results   Component Value Date    PMNS 53 05/12/2018    MONOCYTES 6 05/12/2018    BASOPHILS 0 05/12/2018    BASOPHILS <0.10 05/12/2018    PMNABS 2.96 05/12/2018    LYMPHSABS 2.17 05/12/2018    EOSABS 0.18 05/12/2018    MONOSABS 0.33 05/12/2018     COMPREHENSIVE METABOLIC PANEL - NON FASTING  Lab Results   Component Value Date    SODIUM 137 05/20/2019    POTASSIUM 3.9 05/20/2019    CHLORIDE 100 05/20/2019    CO2 25 05/20/2019    ANIONGAP 12 05/20/2019    BUN 13 05/20/2019    CREATININE 0.80 05/20/2019    GLUCOSENF 157 (H) 05/20/2019    CALCIUM 10.5 (H) 05/20/2019  ALBUMIN 4.6 01/19/2019    TOTALPROTEIN 7.3 01/19/2019    ALKPHOS 75 01/19/2019    AST 30 02/10/2019    ALT 32 01/19/2019        Recent Results (from the past 17520 hour(s))   MAMMO BILATERAL SCREENING-ADDL VIEWS/BREAST US AS REQ BY RAD    Collection Time: 10/27/18 10:17 AM    Narrative    Computer aided detection (CAD) technology has been applied to the standard   (CC and MLO) images.    2D images were acquired and reviewed.    FILMS COMPARED:  Compared to: 10/24/2017 MAMMO BILAT  SCREENING WO TOMO-ADDL VIEWS/BREAST US   AS REQ BY RAD    HISTORY:  Procedure: MAMMO BILATERAL SCREENING WO TOMO-ADDL VIEWS/BREAST US AS REQ   BY RAD  Reason for exam: Breast cancer screening by mammogram    FINDINGS:  The breasts have scattered areas of fibroglandular density.    Bilateral  There is no evidence of suspicious masses, calcifications, or other   abnormal findings.      Impression    Follow up mammogram in 1 year is recommended for both breasts.    BI-RADS ATLAS category (overall): 2 Benign        Social Determinants identified with potential adverse effects on health outcomes:  none           Assessment/Plan:    ICD-10-CM    1. Type 2 diabetes mellitus (CMS HCC)  E11.9    2. Hyperlipidemia, mixed  E78.2    3. Essential hypertension  I10    4. RLS (restless legs syndrome)  G25.81    5. Acquired hypothyroidism  E03.9    6. Anxiety  F41.9    7. History of depression  Z86.59          Plan: Discussed with the patient if her LDH is still elevated will have her see hematology. She was given cards to keep track of her BP,sugars and weights and drop off. She is to f/u in 6-8 weeks. She is to f/u asap if any problems.           No orders of the defined types were placed in this encounter.        Follow up:  No follow-ups on file.    This note was partially generated using MModal Fluency Direct system, and there may be some incorrect words, spellings, and punctuation that were not noted in checking the note before saving.    Leonie Man, MD  09/20/2019, 19:14

## 2019-09-21 ENCOUNTER — Other Ambulatory Visit (INDEPENDENT_AMBULATORY_CARE_PROVIDER_SITE_OTHER): Payer: Self-pay | Admitting: FAMILY MEDICINE

## 2019-09-21 ENCOUNTER — Other Ambulatory Visit: Payer: Self-pay

## 2019-09-21 ENCOUNTER — Encounter (INDEPENDENT_AMBULATORY_CARE_PROVIDER_SITE_OTHER): Payer: Self-pay | Admitting: FAMILY MEDICINE

## 2019-09-21 ENCOUNTER — Ambulatory Visit: Payer: 59 | Attending: FAMILY MEDICINE | Admitting: FAMILY MEDICINE

## 2019-09-21 ENCOUNTER — Telehealth (INDEPENDENT_AMBULATORY_CARE_PROVIDER_SITE_OTHER): Payer: Self-pay | Admitting: FAMILY MEDICINE

## 2019-09-21 VITALS — BP 122/70 | HR 74 | Temp 98.5°F | Resp 16 | Ht 64.0 in | Wt 211.2 lb

## 2019-09-21 DIAGNOSIS — I10 Essential (primary) hypertension: Secondary | ICD-10-CM | POA: Insufficient documentation

## 2019-09-21 DIAGNOSIS — F419 Anxiety disorder, unspecified: Secondary | ICD-10-CM | POA: Insufficient documentation

## 2019-09-21 DIAGNOSIS — Z8659 Personal history of other mental and behavioral disorders: Secondary | ICD-10-CM

## 2019-09-21 DIAGNOSIS — E782 Mixed hyperlipidemia: Secondary | ICD-10-CM | POA: Insufficient documentation

## 2019-09-21 DIAGNOSIS — M1990 Unspecified osteoarthritis, unspecified site: Secondary | ICD-10-CM | POA: Insufficient documentation

## 2019-09-21 DIAGNOSIS — Z7984 Long term (current) use of oral hypoglycemic drugs: Secondary | ICD-10-CM | POA: Insufficient documentation

## 2019-09-21 DIAGNOSIS — G2581 Restless legs syndrome: Secondary | ICD-10-CM | POA: Insufficient documentation

## 2019-09-21 DIAGNOSIS — E119 Type 2 diabetes mellitus without complications: Secondary | ICD-10-CM | POA: Insufficient documentation

## 2019-09-21 DIAGNOSIS — R7402 Elevation of levels of lactic acid dehydrogenase (LDH): Secondary | ICD-10-CM | POA: Insufficient documentation

## 2019-09-21 DIAGNOSIS — E039 Hypothyroidism, unspecified: Secondary | ICD-10-CM | POA: Insufficient documentation

## 2019-09-21 DIAGNOSIS — Z79890 Hormone replacement therapy (postmenopausal): Secondary | ICD-10-CM | POA: Insufficient documentation

## 2019-09-21 DIAGNOSIS — Z888 Allergy status to other drugs, medicaments and biological substances status: Secondary | ICD-10-CM | POA: Insufficient documentation

## 2019-09-21 DIAGNOSIS — Z79899 Other long term (current) drug therapy: Secondary | ICD-10-CM | POA: Insufficient documentation

## 2019-09-21 DIAGNOSIS — F329 Major depressive disorder, single episode, unspecified: Secondary | ICD-10-CM | POA: Insufficient documentation

## 2019-09-21 DIAGNOSIS — Z7982 Long term (current) use of aspirin: Secondary | ICD-10-CM | POA: Insufficient documentation

## 2019-09-21 LAB — LDH: LDH: 272 U/L — ABNORMAL HIGH (ref 125–220)

## 2019-09-21 LAB — BASIC METABOLIC PANEL, FASTING
ANION GAP: 15 mmol/L — ABNORMAL HIGH (ref 4–13)
BUN/CREA RATIO: 15 (ref 6–22)
BUN: 11 mg/dL (ref 8–25)
CALCIUM: 10.2 mg/dL (ref 8.8–10.2)
CHLORIDE: 103 mmol/L (ref 96–111)
CO2 TOTAL: 25 mmol/L (ref 23–31)
CREATININE: 0.73 mg/dL (ref 0.60–1.05)
ESTIMATED GFR: 87 mL/min/BSA (ref >=60)
GLUCOSE: 118 mg/dL — ABNORMAL HIGH (ref 70–99)
POTASSIUM: 3.9 mmol/L (ref 3.5–5.1)
SODIUM: 143 mmol/L (ref 136–145)

## 2019-09-21 LAB — HGA1C (HEMOGLOBIN A1C WITH EST AVG GLUCOSE)
ESTIMATED AVERAGE GLUCOSE: 171 mg/dL
HEMOGLOBIN A1C: 7.6 % — ABNORMAL HIGH (ref 4.0–6.0)

## 2019-09-21 MED ORDER — DULOXETINE 20 MG CAPSULE,DELAYED RELEASE
20.00 mg | DELAYED_RELEASE_CAPSULE | Freq: Every day | ORAL | 1 refills | Status: DC
Start: 2019-09-21 — End: 2020-05-30

## 2019-09-21 MED ORDER — ALLOPURINOL 100 MG TABLET
ORAL_TABLET | ORAL | 1 refills | Status: DC
Start: 2019-09-21 — End: 2020-10-25

## 2019-09-21 MED ORDER — LISINOPRIL 5 MG TABLET
5.00 mg | ORAL_TABLET | Freq: Every day | ORAL | 1 refills | Status: DC
Start: 2019-09-21 — End: 2020-02-17

## 2019-09-21 MED ORDER — LEVOTHYROXINE 100 MCG TABLET
100.00 ug | ORAL_TABLET | Freq: Every morning | ORAL | 1 refills | Status: DC
Start: 2019-09-21 — End: 2020-01-27

## 2019-09-21 MED ORDER — GABAPENTIN 600 MG TABLET
ORAL_TABLET | ORAL | 0 refills | Status: DC
Start: 2019-09-21 — End: 2019-11-23

## 2019-09-21 NOTE — Nursing Note (Signed)
Patient here for recheck and requests refill on some of her medications  Adra Shepler, MA

## 2019-09-21 NOTE — Telephone Encounter (Signed)
Patient notified of her lab results.  She understands to repeat her labs in 2 weeks  Waynetta Sandy, MA

## 2019-09-21 NOTE — Progress Notes (Signed)
Please let this patient know the results of her labs. Have her repeat her LDH in 2 weeks. Let her know that the results is much better from 382 down to 272 with normal up to 220.

## 2019-09-21 NOTE — Telephone Encounter (Signed)
-----   Message from Leonie Man, MD sent at 09/21/2019  5:37 PM EDT -----  Please let this patient know the results of her labs. Have her repeat her LDH in 2 weeks. Let her know that the results is much better from 382 down to 272 with normal up to 220.

## 2019-10-06 ENCOUNTER — Ambulatory Visit: Payer: 59 | Attending: FAMILY PRACTICE

## 2019-10-06 ENCOUNTER — Other Ambulatory Visit: Payer: Self-pay

## 2019-10-06 DIAGNOSIS — R7402 Elevation of levels of lactic acid dehydrogenase (LDH): Secondary | ICD-10-CM | POA: Insufficient documentation

## 2019-10-06 LAB — LDH: LDH: 300 U/L — ABNORMAL HIGH (ref 125–220)

## 2019-10-06 NOTE — Nursing Note (Signed)
Lab drawn.

## 2019-10-09 ENCOUNTER — Telehealth (INDEPENDENT_AMBULATORY_CARE_PROVIDER_SITE_OTHER): Payer: Self-pay | Admitting: FAMILY MEDICINE

## 2019-10-09 ENCOUNTER — Other Ambulatory Visit (INDEPENDENT_AMBULATORY_CARE_PROVIDER_SITE_OTHER): Payer: Self-pay | Admitting: FAMILY MEDICINE

## 2019-10-09 DIAGNOSIS — E119 Type 2 diabetes mellitus without complications: Secondary | ICD-10-CM

## 2019-10-09 DIAGNOSIS — R7402 Elevation of levels of lactic acid dehydrogenase (LDH): Secondary | ICD-10-CM

## 2019-10-09 DIAGNOSIS — I10 Essential (primary) hypertension: Secondary | ICD-10-CM

## 2019-10-09 NOTE — Telephone Encounter (Signed)
Please let the patient know the results of her labs. Let her know that I will place an order for her to get a chest xray and also a ct of her abd and pelvis. She can get the CXR any time but Osvaldo Human will call her about the ct abd and pelvis.

## 2019-10-12 NOTE — Telephone Encounter (Signed)
Pt. Notified and verbalized understanding. Caley Ciaramitaro, RN

## 2019-10-14 ENCOUNTER — Telehealth (INDEPENDENT_AMBULATORY_CARE_PROVIDER_SITE_OTHER): Payer: Self-pay | Admitting: FAMILY MEDICINE

## 2019-10-14 NOTE — Telephone Encounter (Signed)
Called pt and let her know that her CT Abdomen and Pelvis is scheduled for Sept 29th @ 8 am and to arrive 1 hr early and NPO after midnight; per Georga Hacking at [facility] no auth required

## 2019-10-21 ENCOUNTER — Ambulatory Visit (HOSPITAL_COMMUNITY)
Admission: RE | Admit: 2019-10-21 | Discharge: 2019-10-21 | Disposition: A | Discharge status: Home / Self Care | Payer: 59 | Source: Ambulatory Visit | Attending: FAMILY MEDICINE | Admitting: FAMILY MEDICINE

## 2019-10-21 ENCOUNTER — Telehealth (INDEPENDENT_AMBULATORY_CARE_PROVIDER_SITE_OTHER): Payer: Self-pay | Admitting: FAMILY MEDICINE

## 2019-10-21 ENCOUNTER — Ambulatory Visit
Admission: RE | Admit: 2019-10-21 | Discharge: 2019-10-21 | Disposition: A | Discharge status: Home / Self Care | Payer: 59 | Source: Ambulatory Visit | Attending: FAMILY MEDICINE | Admitting: FAMILY MEDICINE

## 2019-10-21 ENCOUNTER — Other Ambulatory Visit: Payer: Self-pay

## 2019-10-21 DIAGNOSIS — E119 Type 2 diabetes mellitus without complications: Secondary | ICD-10-CM

## 2019-10-21 DIAGNOSIS — I10 Essential (primary) hypertension: Secondary | ICD-10-CM | POA: Insufficient documentation

## 2019-10-21 DIAGNOSIS — R7402 Elevation of levels of lactic acid dehydrogenase (LDH): Secondary | ICD-10-CM | POA: Insufficient documentation

## 2019-10-21 NOTE — Telephone Encounter (Signed)
Patient notified and understand  Waynetta Sandy, MA

## 2019-10-21 NOTE — Progress Notes (Signed)
Please let the patient know the results of her abd/pelvic ct and CXR.

## 2019-10-21 NOTE — Telephone Encounter (Signed)
-----   Message from Leonie Man, MD sent at 10/21/2019  5:27 PM EDT -----  Please let the patient know the results of her abd/pelvic ct and CXR.

## 2019-10-29 ENCOUNTER — Other Ambulatory Visit (INDEPENDENT_AMBULATORY_CARE_PROVIDER_SITE_OTHER): Payer: Self-pay | Admitting: FAMILY MEDICINE

## 2019-10-29 DIAGNOSIS — R7402 Elevation of levels of lactic acid dehydrogenase (LDH): Secondary | ICD-10-CM

## 2019-10-30 ENCOUNTER — Ambulatory Visit (HOSPITAL_COMMUNITY): Payer: Self-pay

## 2019-11-14 ENCOUNTER — Ambulatory Visit (HOSPITAL_COMMUNITY): Payer: Self-pay

## 2019-11-22 NOTE — Progress Notes (Signed)
Family Medicine, [facility]  [address]  [place] [address]  [phone]      Patient Name:  Gwendolyn Crawford  MRN:  [id number]  DOB:  23-Jun-1955    Date of Service:  11/23/2019    Chief Complaint:   Chief Complaint   Patient presents with    Check Up       Subjective:  Gwendolyn Crawford is a 64 y.o. female who returns for a f/u apt. She states that she had her COVID vaccine last week. She wants to wait on the flu vaccine. She states that she does not have any night sweats. She states that she has sweats with exertion. She states that she has tolerated the decrease the Cymbalta. She is to continue to taper down off the Cymbalta to every other day for 2 weeks then every third day for 2 weeks and then slowly taper off. She states that her BP and sugars have been fine. She states that she stepped in a hole about a week and twisted her left ankle but it is doing better.     Past Medical History:  Past Medical History:   Diagnosis Date    Allergic rhinitis     Anxiety     Arthritis     Depression     Diabetes mellitus, type 2 (CMS HCC)     Gout     Headache     Hypercholesterolemia     Hypertension     Hypothyroid     Hypothyroidism     Insomnia     PONV (postoperative nausea and vomiting)     RLS (restless legs syndrome)     Vitamin deficiency     Wears glasses             Past Surgical History:  Past Surgical History:   Procedure Laterality Date    COLONOSCOPY      ESOPHAGOGASTRODUODENOSCOPY      HX BREAST BIOPSY      HX BENIGN BREAST BX UNSURE WHICH SIDE    HX CHOLECYSTECTOMY              Review of Systems:  Review of Systems   Constitutional: Negative for fever.   HENT: Negative for sore throat.    Eyes: Negative for blurred vision.   Respiratory: Negative for cough and shortness of breath.    Cardiovascular: Negative for chest pain.   Gastrointestinal: Negative for abdominal pain, nausea and vomiting.   Genitourinary: Negative for dysuria.   Musculoskeletal: Negative  for falls.   Skin: Negative for rash.   Neurological: Negative for headaches.         Current Outpatient Medications   Medication Sig    acetaminophen (TYLENOL) 325 mg Oral Tablet Take 325 mg by mouth Every 4 hours as needed for Pain    allopurinoL (ZYLOPRIM) 100 mg Oral Tablet TAKE ONE TABLET BY MOUTH TWO TIMES A DAY    ascorbic acid (VITAMIN C) 1,000 mg Oral Tablet Take 1,000 mg by mouth Once a day    aspirin (ECOTRIN) 81 mg Oral Tablet, Delayed Release (E.C.) Take 81 mg by mouth Once a day    atorvastatin (LIPITOR) 10 mg Oral Tablet Take 1 Tablet (10 mg total) by mouth Once a day for 90 days    biotin 5,000 mcg Oral Tablet, Rapid Dissolve Take by mouth Once a day    cholecalciferol, vitamin D3, 25 mcg (1,000 unit) Oral Tablet Take 1,000 Units by  mouth Once a day    cinnamon bark (CINNAMON ORAL) Take 1,000 mg by mouth bid    cyanocobalamin (VITAMIN B 12) 1,000 mcg Oral Tablet Take 1,000 mcg by mouth Once a day    DULoxetine (CYMBALTA DR) 20 mg Oral Capsule, Delayed Release(E.C.) Take 1 Capsule (20 mg total) by mouth Once a day for 90 days    gabapentin (NEURONTIN) 600 mg Oral Tablet TAKE ONE TABLET BY MOUTH THREE TIMES A DAY    glimepiride (AMARYL) 1 mg Oral Tablet Take 1 Tablet (1 mg total) by mouth Every morning    levothyroxine (SYNTHROID) 100 mcg Oral Tablet Take 1 Tablet (100 mcg total) by mouth Every morning    lisinopriL (PRINIVIL) 5 mg Oral Tablet Take 1 Tablet (5 mg total) by mouth Once a day    loratadine (CLARITIN) 10 mg Oral Tablet Take 10 mg by mouth Once a day    meclizine (ANTIVERT) 25 mg Oral Tablet Take 25 mg by mouth Every 8 hours as needed     MetFORMIN (GLUCOPHAGE) 1,000 mg Oral Tablet TAKE ONE TABLET BY MOUTH EVERY MORNING AND EVERY EVENING WITH FOOD FOR BLOOD SUGAR    multivit with minerals/lutein (MULTIVITAMIN 50 PLUS ORAL) Take by mouth    naproxen Sodium (ALEVE) 220 mg Oral Tablet Take 220 mg by mouth Every 12 hours as needed     triamcinolone acetonide (ARISTOCORT) 0.5  % Cream        Allergies   Allergen Reactions    Zocor [Simvastatin] Myalgia    Crestor [Rosuvastatin]  Other Adverse Reaction (Add comment) and Myalgia     unknown       Objective:    BP 138/82    Pulse 75    Temp 36.3 C (97.3 F)    Resp 20    Ht 1.626 m (5\' 4" )    Wt 97.1 kg (214 lb)    SpO2 97%    BMI 36.73 kg/m       Body mass index is 36.73 kg/m.     Physical Exam:  Constitutional: she is oriented to person, place, and time and well-developed, well-nourished, and in no distress.   HENT: right tm obscured by cerumen and left tm clear. No erythema or exudate.   Head: Normocephalic.   Eyes: Pupils are equal, round, and reactive to light. Conjunctivae and EOM are normal. Right eye exhibits no discharge. Left eye exhibits no discharge.   Neck: Normal range of motion. Neck supple. No thyromegaly present.   Cardiovascular: Normal rate, regular rhythm, normal heart sounds and intact distal pulses. Exam reveals no friction rub.   No murmur heard.  Pulmonary/Chest: Breath sounds normal. No respiratory distress. she  has no wheezes. she  has no rales.   Musculoskeletal:         General: No tenderness or edema.   Lymphadenopathy:     she  has no cervical adenopathy.   Neurological she  is alert and oriented to person, place, and time.   Skin: Skin is warm and dry.   Psychiatric: Affect normal.    Diabetic foot exam:  No edema bilaterally. Pulses normal bilaterally.        Data reviewed:  HEMOGLOBIN A1C  Lab Results   Component Value Date    HA1C 7.6 (H) 09/21/2019     COMPLETE BLOOD COUNT   Lab Results   Component Value Date    WBC 7.2 05/20/2019    HGB 14.8 05/20/2019    HCT 45.1  05/20/2019    PLTCNT 250 05/20/2019       DIFFERENTIAL  Lab Results   Component Value Date    PMNS 53 05/12/2018    MONOCYTES 6 05/12/2018    BASOPHILS 0 05/12/2018    BASOPHILS <0.10 05/12/2018    PMNABS 2.96 05/12/2018    LYMPHSABS 2.17 05/12/2018    EOSABS 0.18 05/12/2018    MONOSABS 0.33 05/12/2018     COMPREHENSIVE METABOLIC PANEL -  NON FASTING  Lab Results   Component Value Date    SODIUM 143 09/21/2019    POTASSIUM 3.9 09/21/2019    CHLORIDE 103 09/21/2019    CO2 25 09/21/2019    ANIONGAP 15 (H) 09/21/2019    BUN 11 09/21/2019    CREATININE 0.73 09/21/2019    GLUCOSENF 118 (H) 09/21/2019    CALCIUM 10.2 09/21/2019    ALBUMIN 4.6 01/19/2019    TOTALPROTEIN 7.3 01/19/2019    ALKPHOS 75 01/19/2019    AST 30 02/10/2019    ALT 32 01/19/2019        Recent Results (from the past 72620 hour(s))   MAMMO BILATERAL SCREENING-ADDL VIEWS/BREAST US AS REQ BY RAD    Collection Time: 10/27/18 10:17 AM    Narrative    Computer aided detection (CAD) technology has been applied to the standard   (CC and MLO) images.    2D images were acquired and reviewed.    FILMS COMPARED:  Compared to: 10/24/2017 MAMMO BILAT SCREENING WO TOMO-ADDL VIEWS/BREAST US   AS REQ BY RAD    HISTORY:  Procedure: MAMMO BILATERAL SCREENING WO TOMO-ADDL VIEWS/BREAST US AS REQ   BY RAD  Reason for exam: Breast cancer screening by mammogram    FINDINGS:  The breasts have scattered areas of fibroglandular density.    Bilateral  There is no evidence of suspicious masses, calcifications, or other   abnormal findings.      Impression    Follow up mammogram in 1 year is recommended for both breasts.    BI-RADS ATLAS category (overall): 2 Benign        Social Determinants identified with potential adverse effects on health outcomes:  none           Assessment/Plan:    ICD-10-CM    1. Type 2 diabetes mellitus (CMS HCC)  E11.9 CBC     COMPREHENSIVE METABOLIC PANEL, NON-FASTING     CBC     COMPREHENSIVE METABOLIC PANEL, NON-FASTING   2. Primary hypertension  I10 CBC     COMPREHENSIVE METABOLIC PANEL, NON-FASTING     CBC     COMPREHENSIVE METABOLIC PANEL, NON-FASTING   3. Hyperlipidemia, mixed  E78.2 CBC     COMPREHENSIVE METABOLIC PANEL, NON-FASTING     CBC     COMPREHENSIVE METABOLIC PANEL, NON-FASTING   4. RLS (restless legs syndrome)  G25.81 CBC     COMPREHENSIVE METABOLIC PANEL, NON-FASTING      CBC     COMPREHENSIVE METABOLIC PANEL, NON-FASTING   5. Acquired hypothyroidism  E03.9 THYROXINE, FREE (FREE T4)     THYROID STIMULATING HORMONE (SENSITIVE TSH)     CBC     COMPREHENSIVE METABOLIC PANEL, NON-FASTING     THYROXINE, FREE (FREE T4)     THYROID STIMULATING HORMONE (SENSITIVE TSH)     CBC     COMPREHENSIVE METABOLIC PANEL, NON-FASTING   6. Anxiety  F41.9 CBC     COMPREHENSIVE METABOLIC PANEL, NON-FASTING     CBC     COMPREHENSIVE METABOLIC PANEL,  NON-FASTING   7. History of depression  Z86.59 CBC     COMPREHENSIVE METABOLIC PANEL, NON-FASTING     CBC     COMPREHENSIVE METABOLIC PANEL, NON-FASTING   8. Elevated LDH  R74.02 LACTATE DEHYDROGENASE (LDH) ISOENZYMES, SERUM         Plan: Reviewed with the patient the results of her CXR,ct abd and pelvic and labs. She is to f/u asap if any problems. Reminded about her eye exam. She was given cards to continue to keep track of her BP and sugars.            Orders Placed This Encounter    THYROXINE, FREE (FREE T4)    THYROID STIMULATING HORMONE (SENSITIVE TSH)    CBC    COMPREHENSIVE METABOLIC PANEL, NON-FASTING         Follow up:  No follow-ups on file.    This note was partially generated using MModal Fluency Direct system, and there may be some incorrect words, spellings, and punctuation that were not noted in checking the note before saving.    Leonie Man, MD  11/22/2019, 16:39

## 2019-11-23 ENCOUNTER — Other Ambulatory Visit: Payer: Self-pay

## 2019-11-23 ENCOUNTER — Other Ambulatory Visit (INDEPENDENT_AMBULATORY_CARE_PROVIDER_SITE_OTHER): Payer: Self-pay | Admitting: FAMILY MEDICINE

## 2019-11-23 ENCOUNTER — Encounter (INDEPENDENT_AMBULATORY_CARE_PROVIDER_SITE_OTHER): Payer: Self-pay | Admitting: FAMILY MEDICINE

## 2019-11-23 ENCOUNTER — Ambulatory Visit: Payer: 59 | Attending: FAMILY MEDICINE | Admitting: FAMILY MEDICINE

## 2019-11-23 VITALS — BP 138/82 | HR 75 | Temp 97.3°F | Resp 20 | Ht 64.0 in | Wt 214.0 lb

## 2019-11-23 DIAGNOSIS — E782 Mixed hyperlipidemia: Secondary | ICD-10-CM | POA: Insufficient documentation

## 2019-11-23 DIAGNOSIS — Z7984 Long term (current) use of oral hypoglycemic drugs: Secondary | ICD-10-CM | POA: Insufficient documentation

## 2019-11-23 DIAGNOSIS — Z8659 Personal history of other mental and behavioral disorders: Secondary | ICD-10-CM

## 2019-11-23 DIAGNOSIS — E119 Type 2 diabetes mellitus without complications: Secondary | ICD-10-CM | POA: Insufficient documentation

## 2019-11-23 DIAGNOSIS — R7402 Elevation of levels of lactic acid dehydrogenase (LDH): Secondary | ICD-10-CM | POA: Insufficient documentation

## 2019-11-23 DIAGNOSIS — I10 Essential (primary) hypertension: Secondary | ICD-10-CM | POA: Insufficient documentation

## 2019-11-23 DIAGNOSIS — F419 Anxiety disorder, unspecified: Secondary | ICD-10-CM | POA: Insufficient documentation

## 2019-11-23 DIAGNOSIS — Z6836 Body mass index (BMI) 36.0-36.9, adult: Secondary | ICD-10-CM | POA: Insufficient documentation

## 2019-11-23 DIAGNOSIS — Z7982 Long term (current) use of aspirin: Secondary | ICD-10-CM | POA: Insufficient documentation

## 2019-11-23 DIAGNOSIS — Z79899 Other long term (current) drug therapy: Secondary | ICD-10-CM | POA: Insufficient documentation

## 2019-11-23 DIAGNOSIS — G2581 Restless legs syndrome: Secondary | ICD-10-CM | POA: Insufficient documentation

## 2019-11-23 DIAGNOSIS — F329 Major depressive disorder, single episode, unspecified: Secondary | ICD-10-CM | POA: Insufficient documentation

## 2019-11-23 DIAGNOSIS — E039 Hypothyroidism, unspecified: Secondary | ICD-10-CM | POA: Insufficient documentation

## 2019-11-23 LAB — COMPREHENSIVE METABOLIC PANEL, NON-FASTING
ALBUMIN: 4.4 g/dL (ref 3.4–4.8)
ALKALINE PHOSPHATASE: 70 U/L (ref 50–130)
ALT (SGPT): 30 U/L — ABNORMAL HIGH (ref 8–22)
ANION GAP: 13 mmol/L (ref 4–13)
AST (SGOT): 22 U/L (ref 8–45)
BILIRUBIN TOTAL: 1 mg/dL (ref 0.3–1.3)
BUN/CREA RATIO: 16 (ref 6–22)
BUN: 12 mg/dL (ref 8–25)
CALCIUM: 10.4 mg/dL — ABNORMAL HIGH (ref 8.8–10.2)
CHLORIDE: 104 mmol/L (ref 96–111)
CO2 TOTAL: 25 mmol/L (ref 23–31)
CREATININE: 0.73 mg/dL (ref 0.60–1.05)
ESTIMATED GFR: 87 mL/min/BSA (ref >=60)
GLUCOSE: 133 mg/dL — ABNORMAL HIGH (ref 65–125)
POTASSIUM: 4.2 mmol/L (ref 3.5–5.1)
PROTEIN TOTAL: 7.7 g/dL (ref 6.0–8.0)
SODIUM: 142 mmol/L (ref 136–145)

## 2019-11-23 LAB — CBC
HCT: 45.2 % (ref 34.8–46.0)
HGB: 14.6 g/dL (ref 11.5–16.0)
MCH: 30 pg (ref 26.0–32.0)
MCHC: 32.3 g/dL (ref 31.0–35.5)
MCV: 93 fL (ref 78.0–100.0)
MPV: 10.7 fL (ref 8.7–12.5)
PLATELETS: 249 10*3/uL (ref 150–400)
RBC: 4.86 10*6/uL (ref 3.85–5.22)
RDW-CV: 13.1 % (ref 11.5–15.5)
WBC: 7.1 10*3/uL (ref 3.7–11.0)

## 2019-11-23 LAB — THYROID STIMULATING HORMONE (SENSITIVE TSH): TSH: 4.836 u[IU]/mL — ABNORMAL HIGH (ref 0.430–3.550)

## 2019-11-23 LAB — THYROXINE, FREE (FREE T4): THYROXINE (T4), FREE: 0.99 ng/dL (ref 0.70–1.25)

## 2019-11-23 NOTE — Nursing Note (Signed)
Pt here for her regular check up. She does not want to get her flu vaccine yet. She reports just having her covid booster last week. Daiva Huge, RN

## 2019-11-27 LAB — LACTATE DEHYDROGENASE (LDH) ISOENZYMES, SERUM
LD 1: 20 % (ref 19–38)
LD 2: 35 % (ref 30–43)
LD 3: 25 % (ref 16–26)
LD 4: 10 % (ref 3–12)
LD 5: 10 % (ref 3–14)
LDH: 175 U/L (ref 120–250)

## 2019-11-30 ENCOUNTER — Telehealth (INDEPENDENT_AMBULATORY_CARE_PROVIDER_SITE_OTHER): Payer: Self-pay | Admitting: FAMILY MEDICINE

## 2019-11-30 DIAGNOSIS — R7401 Elevation of levels of liver transaminase levels: Secondary | ICD-10-CM

## 2019-11-30 NOTE — Telephone Encounter (Signed)
Please let the patient know the results of her labs.  Also her LDH was normal as were the isoenzymes as we had discussed.  Her calcium was slightly elevated as was her ALT.  Have her stop any calcium supplements or foods rich in calcium and repeat her cmp in 2-3 weeks.  Will also repeat some other labs related to her calcium and her elevated ALT.  Also her TSH was slightly abnormal and will repeat this at her follow-up appointment in January.

## 2019-12-01 ENCOUNTER — Encounter (INDEPENDENT_AMBULATORY_CARE_PROVIDER_SITE_OTHER): Payer: Self-pay | Admitting: Surgery

## 2019-12-01 NOTE — Telephone Encounter (Signed)
Pt. Notified and verbalized understanding. Collette Pescador, RN

## 2019-12-11 ENCOUNTER — Other Ambulatory Visit: Payer: Self-pay

## 2019-12-11 ENCOUNTER — Ambulatory Visit: Payer: 59 | Attending: FAMILY PRACTICE

## 2019-12-11 DIAGNOSIS — R7401 Elevation of levels of liver transaminase levels: Secondary | ICD-10-CM

## 2019-12-11 LAB — COMPREHENSIVE METABOLIC PANEL, NON-FASTING
ALBUMIN: 4.4 g/dL (ref 3.4–4.8)
ALKALINE PHOSPHATASE: 70 U/L (ref 50–130)
ALT (SGPT): 30 U/L — ABNORMAL HIGH (ref 8–22)
ANION GAP: 10 mmol/L (ref 4–13)
AST (SGOT): 24 U/L (ref 8–45)
BILIRUBIN TOTAL: 1.4 mg/dL — ABNORMAL HIGH (ref 0.3–1.3)
BUN/CREA RATIO: 13 (ref 6–22)
BUN: 10 mg/dL (ref 8–25)
CALCIUM: 10.2 mg/dL (ref 8.8–10.2)
CHLORIDE: 104 mmol/L (ref 96–111)
CO2 TOTAL: 27 mmol/L (ref 23–31)
CREATININE: 0.78 mg/dL (ref 0.60–1.05)
ESTIMATED GFR: 81 mL/min/BSA (ref >=60)
GLUCOSE: 132 mg/dL — ABNORMAL HIGH (ref 65–125)
POTASSIUM: 4.2 mmol/L (ref 3.5–5.1)
PROTEIN TOTAL: 7.4 g/dL (ref 6.0–8.0)
SODIUM: 141 mmol/L (ref 136–145)

## 2019-12-11 LAB — MICROALBUMIN, RANDOM URINE FOR ELECTROPHORESIS: MICROALBUMIN RANDOM URINE: <0.5 mg/dL

## 2019-12-11 LAB — ALPHA-1-ANTITRYPSIN: ALPHA 1 ANTITRYPSIN: 144 mg/dL (ref 90–200)

## 2019-12-11 LAB — FERRITIN: FERRITIN: 32 ng/mL (ref 5–200)

## 2019-12-11 LAB — HEPATITIS B SURFACE ANTIGEN: HBV SURFACE ANTIGEN QUALITATIVE: NEGATIVE

## 2019-12-11 LAB — VITAMIN D 25 TOTAL: VITAMIN D 25, TOTAL: 39.5 ng/mL (ref 30.0–100.0)

## 2019-12-11 LAB — PROTEIN, RANDOM URINE FOR ELECTROPHORESIS: PROTEIN RANDOM URINE: <7 mg/dL

## 2019-12-11 LAB — HEPATITIS B CORE IGM, AB: HBV CORE IGM ANTIBODY QUALITATIVE: NEGATIVE

## 2019-12-11 LAB — PARATHYROID HORMONE (PTH): PTH: 62.1 pg/mL (ref 8.5–77.0)

## 2019-12-11 LAB — CERULOPLASMIN: CERULOPLASMIN: 27 mg/dL (ref 18–45)

## 2019-12-11 LAB — PROTEIN FOR ELECTROPHORESIS: PROTEIN TOTAL: 7.2 g/dL (ref 5.6–7.6)

## 2019-12-11 LAB — HEPATITIS A (HAV) IGM ANTIBODY: HAV IGM: NEGATIVE

## 2019-12-11 LAB — ALBUMIN FOR ELECTROPHORESIS: ALBUMIN: 4.2 g/dL (ref 3.4–4.8)

## 2019-12-11 NOTE — Addendum Note (Signed)
Addended by: Garvin Fila B on: 12/11/2019 02:24 PM     Modules accepted: Orders

## 2019-12-11 NOTE — Nursing Note (Signed)
Patient drawn

## 2019-12-14 LAB — ANA (ANTINUCLEAR ANTIBODIES), SERUM
ANTI-NUCLEAR ANTIBODIES,QUALITATIVE: POSITIVE — AB
ANTI-NUCLEAR ANTIBODIES,QUANTITATIVE: 1.41 {index_val} — ABNORMAL HIGH (ref <=0.90)

## 2019-12-14 LAB — IMMUNOGLOBULIN PROFILE (IGA, IGG, AND IGM), SERUM
IMMUNOGLOBULIN A (IGA): 199 mg/dL (ref 85–499)
IMMUNOGLOBULIN G (IGG): 831 mg/dL (ref 610–1616)
IMMUNOGLOBULIN M (IGM): 140 mg/dL (ref 35–242)

## 2019-12-14 LAB — PROTEIN ELECTROPHORESIS, RANDOM URINE (UPEP)
MICROALBUMIN RANDOM URINE: <0.5 mg/dL
PATHOLOGIST INTERPRETATION UPEP RANDOM: NORMAL
PROTEIN RANDOM URINE: <7 mg/dL

## 2019-12-14 LAB — PROTEIN ELECTROPHORESIS, SERUM (SPEP)
ALBUMIN: 4.2 g/dL (ref 3.4–4.8)
PATHOLOGIST INTERPRETATION SPEP: NORMAL
PROTEIN TOTAL: 7.2 g/dL (ref 5.6–7.6)

## 2019-12-15 LAB — ANA REFLEX
ANA FINAL INTERPRETATION: POSITIVE — AB
ANA TITER: 1:320 {titer}

## 2019-12-15 LAB — SMOOTH MUSCLE ANTIBODIES, SERUM

## 2019-12-16 ENCOUNTER — Telehealth (INDEPENDENT_AMBULATORY_CARE_PROVIDER_SITE_OTHER): Payer: Self-pay | Admitting: FAMILY MEDICINE

## 2019-12-16 LAB — MITOCHONDRIAL ANTIBODIES (M2), SERUM
MITOCHONDRIAL ANTIBODIES, QUALITATIVE: NEGATIVE
MITOCHONDRIAL ANTIBODIES, QUANTITATIVE: 0.32 {index_val} (ref <0.91)

## 2019-12-16 NOTE — Telephone Encounter (Signed)
Pt. Notified and will think about if she wants to see a Rheumatologist or not.Corie Chiquito, RN

## 2019-12-16 NOTE — Telephone Encounter (Signed)
Please let the patient know that her ANA was positive.  If okay with the patient will set her up to see telemedicine Rheumatology.

## 2019-12-21 ENCOUNTER — Other Ambulatory Visit (INDEPENDENT_AMBULATORY_CARE_PROVIDER_SITE_OTHER): Payer: Self-pay | Admitting: FAMILY MEDICINE

## 2020-01-04 ENCOUNTER — Encounter (INDEPENDENT_AMBULATORY_CARE_PROVIDER_SITE_OTHER): Payer: Self-pay | Admitting: FAMILY MEDICINE

## 2020-01-04 ENCOUNTER — Other Ambulatory Visit: Payer: Self-pay

## 2020-01-04 ENCOUNTER — Ambulatory Visit
Admission: RE | Admit: 2020-01-04 | Discharge: 2020-01-04 | Disposition: A | Discharge status: Home / Self Care | Payer: 59 | Source: Ambulatory Visit | Attending: FAMILY MEDICINE | Admitting: FAMILY MEDICINE

## 2020-01-04 ENCOUNTER — Encounter (HOSPITAL_COMMUNITY): Payer: Self-pay

## 2020-01-04 DIAGNOSIS — Z1231 Encounter for screening mammogram for malignant neoplasm of breast: Secondary | ICD-10-CM

## 2020-01-05 ENCOUNTER — Other Ambulatory Visit (INDEPENDENT_AMBULATORY_CARE_PROVIDER_SITE_OTHER): Payer: Self-pay

## 2020-01-05 ENCOUNTER — Other Ambulatory Visit (INDEPENDENT_AMBULATORY_CARE_PROVIDER_SITE_OTHER): Payer: Self-pay | Admitting: FAMILY MEDICINE

## 2020-01-05 DIAGNOSIS — Z1231 Encounter for screening mammogram for malignant neoplasm of breast: Secondary | ICD-10-CM

## 2020-01-22 ENCOUNTER — Other Ambulatory Visit (INDEPENDENT_AMBULATORY_CARE_PROVIDER_SITE_OTHER): Payer: Self-pay | Admitting: FAMILY MEDICINE

## 2020-01-26 NOTE — Progress Notes (Signed)
Family Medicine, [facility]  [place] [address]  [phone]      Patient Name:  Gwendolyn Crawford  MRN:  [id number]  DOB:  [birth year]    Date of Service:  01/27/2020    Chief Complaint:   Chief Complaint   Patient presents with    Follow Up 6 Months       Subjective:  Gwendolyn Crawford is a 65 y.o. female who returns for a f/u apt. She states that her sugars have been slightly elevated with the holidays. Discussed with the patient elevated ANA. Patient refused to see rheumatology. Patient states that she has tapered off her antidepressant. She is doing fine and does not want to take another medication for it.     Past Medical History:  Past Medical History:   Diagnosis Date    Allergic rhinitis     Anxiety     Arthritis     Depression     Diabetes mellitus, type 2 (CMS HCC)     Gout     Headache     Hypercholesterolemia     Hypertension     Hypothyroid     Hypothyroidism     Insomnia     PONV (postoperative nausea and vomiting)     RLS (restless legs syndrome)     Vitamin deficiency     Wears glasses             Past Surgical History:  Past Surgical History:   Procedure Laterality Date    COLONOSCOPY      ESOPHAGOGASTRODUODENOSCOPY      HX BREAST BIOPSY Left     HX BENIGN BREAST BX UNSURE WHICH SIDE    HX CHOLECYSTECTOMY              Review of Systems:  Review of Systems   Constitutional: Negative for fever.   HENT: Negative for sore throat.    Eyes: Negative for blurred vision.   Respiratory: Negative for cough and shortness of breath.    Cardiovascular: Negative for chest pain.   Gastrointestinal: Negative for abdominal pain, nausea and vomiting.   Genitourinary: Negative for dysuria.   Musculoskeletal: Negative for falls.   Skin: Negative for rash.   Neurological: Negative for headaches.         Current Outpatient Medications   Medication Sig    acetaminophen (TYLENOL) 325 mg Oral Tablet Take 325 mg by mouth Every 4 hours as needed for Pain     allopurinoL (ZYLOPRIM) 100 mg Oral Tablet TAKE ONE TABLET BY MOUTH TWO TIMES A DAY    ascorbic acid (VITAMIN C) 1,000 mg Oral Tablet Take 1,000 mg by mouth Once a day    aspirin (ECOTRIN) 81 mg Oral Tablet, Delayed Release (E.C.) Take 81 mg by mouth Once a day    atorvastatin (LIPITOR) 10 mg Oral Tablet Take 1 Tablet (10 mg total) by mouth Once a day for 90 days    biotin 5,000 mcg Oral Tablet, Rapid Dissolve Take by mouth Once a day    cholecalciferol, vitamin D3, 25 mcg (1,000 unit) Oral Tablet Take 1,000 Units by mouth Once a day    cinnamon bark (CINNAMON ORAL) Take 1,000 mg by mouth bid    cyanocobalamin (VITAMIN B 12) 1,000 mcg Oral Tablet Take 1,000 mcg by mouth Once a day    DULoxetine (CYMBALTA DR) 20 mg Oral Capsule, Delayed Release(E.C.) Take 1 Capsule (20 mg total) by mouth Once a  day for 90 days    gabapentin (NEURONTIN) 600 mg Oral Tablet TAKE ONE TABLET BY MOUTH THREE TIMES A DAY    glimepiride (AMARYL) 1 mg Oral Tablet TAKE 1 TABLET BY MOUTH EVERY MORNING    levothyroxine (SYNTHROID) 100 mcg Oral Tablet Take 1 Tablet (100 mcg total) by mouth Every morning    lisinopriL (PRINIVIL) 5 mg Oral Tablet Take 1 Tablet (5 mg total) by mouth Once a day    loratadine (CLARITIN) 10 mg Oral Tablet Take 10 mg by mouth Once a day    meclizine (ANTIVERT) 25 mg Oral Tablet Take 25 mg by mouth Every 8 hours as needed     MetFORMIN (GLUCOPHAGE) 1,000 mg Oral Tablet TAKE ONE TABLET BY MOUTH EVERY MORNING AND EVERY EVENING WITH FOOD FOR BLOOD SUGAR    multivit with minerals/lutein (MULTIVITAMIN 50 PLUS ORAL) Take by mouth    naproxen Sodium (ALEVE) 220 mg Oral Tablet Take 220 mg by mouth Every 12 hours as needed     triamcinolone acetonide (ARISTOCORT) 0.5 % Cream        Allergies   Allergen Reactions    Zocor [Simvastatin] Myalgia    Crestor [Rosuvastatin]  Other Adverse Reaction (Add comment) and Myalgia     unknown       Objective:    BP (!) 150/90    Pulse 81    Temp 36.3 C (97.3 F)    Resp 20     Ht 1.626 m (5\' 4" )    Wt 98.2 kg (216 lb 6.4 oz)    SpO2 98%    BMI 37.14 kg/m       Body mass index is 37.14 kg/m.     Physical Exam:  Constitutional: she is oriented to person, place, and time and well-developed, well-nourished, and in no distress.   HENT: Right tm obscured by cerumen on the right and partially on the left. No erythema or exudate.   Head: Normocephalic.   Eyes: Pupils are equal, round, and reactive to light. Conjunctivae and EOM are normal. Right eye exhibits no discharge. Left eye exhibits no discharge.   Neck: Normal range of motion. Neck supple. No thyromegaly present.   Cardiovascular: Normal rate, regular rhythm, normal heart sounds and intact distal pulses. Exam reveals no friction rub.   No murmur heard.  Pulmonary/Chest: Breath sounds normal. No respiratory distress. she  has no wheezes. she  has no rales.   Musculoskeletal:         General: No tenderness or edema.   Lymphadenopathy:     she  has no cervical adenopathy.   Neurological she  is alert and oriented to person, place, and time.   Skin: Skin is warm and dry.   Psychiatric: Affect normal.    Diabetic foot exam:  No edema bilaterally. Pulses normal bilaterally.        Data reviewed:  HEMOGLOBIN A1C  Lab Results   Component Value Date    HA1C 7.6 (H) 09/21/2019     COMPLETE BLOOD COUNT   Lab Results   Component Value Date    WBC 7.1 11/23/2019    HGB 14.6 11/23/2019    HCT 45.2 11/23/2019    PLTCNT 249 11/23/2019       DIFFERENTIAL  Lab Results   Component Value Date    PMNS 53 05/12/2018    MONOCYTES 6 05/12/2018    BASOPHILS 0 05/12/2018    BASOPHILS <0.10 05/12/2018    PMNABS 2.96 05/12/2018  LYMPHSABS 2.17 05/12/2018    EOSABS 0.18 05/12/2018    MONOSABS 0.33 05/12/2018     COMPREHENSIVE METABOLIC PANEL - NON FASTING  Lab Results   Component Value Date    SODIUM 141 12/11/2019    POTASSIUM 4.2 12/11/2019    CHLORIDE 104 12/11/2019    CO2 27 12/11/2019    ANIONGAP 10 12/11/2019    BUN 10 12/11/2019    CREATININE 0.78  12/11/2019    GLUCOSENF 132 (H) 12/11/2019    CALCIUM 10.2 12/11/2019    ALBUMIN 4.4 12/11/2019    ALBUMIN 4.2 12/11/2019    ALBUMIN 4.2 12/11/2019    TOTALPROTEIN 7.4 12/11/2019    TOTALPROTEIN 7.2 12/11/2019    TOTALPROTEIN 7.2 12/11/2019    ALKPHOS 70 12/11/2019    AST 24 12/11/2019    ALT 30 (H) 12/11/2019        Recent Results (from the past 96222 hour(s))   MAMMO BILATERAL SCREENING-ADDL VIEWS/BREAST US AS REQ BY RAD    Collection Time: 01/04/20  9:49 AM    Narrative    Computer aided detection (CAD) technology has been applied to the standard   (CC and MLO) images.    2D images were acquired and reviewed.    FILMS COMPARED:  Compared to: 10/27/2018 MAMMO BILATERAL SCREENING-ADDL VIEWS/BREAST US AS   REQ BY RAD and 10/24/2017 MAMMO BILAT SCREENING WO TOMO-ADDL VIEWS/BREAST   US AS REQ BY RAD    HISTORY:  Procedure: MAMMO BILATERAL SCREENING WO TOMO-ADDL VIEWS/BREAST US AS REQ   BY RAD  Reason for exam: Visit for screening mammogram      FINDINGS:  The breasts have scattered areas of fibroglandular density.    Bilateral  There is no evidence of suspicious masses, calcifications, or other   abnormal findings.      Impression    Follow up mammogram in 1 year is recommended for both breasts.    BI-RADS ATLAS category (overall): 1 - Negative        Social Determinants identified with potential adverse effects on health outcomes:  none           Assessment/Plan:    ICD-10-CM    1. Hyperlipidemia, mixed  E78.2    2. Primary hypertension  I10    3. RLS (restless legs syndrome)  G25.81    4. Type 2 diabetes mellitus (CMS HCC)  E11.9    5. Acquired hypothyroidism  E03.9 THYROXINE, FREE (FREE T4)     THYROID STIMULATING HORMONE (SENSITIVE TSH)     THYROXINE, FREE (FREE T4)     THYROID STIMULATING HORMONE (SENSITIVE TSH)   6. History of depression  Z86.59    7. Elevated ALT measurement  R74.01 HEPATIC FUNCTION PANEL     HEPATIC FUNCTION PANEL     [place] RT UPPER QUADRANT         Plan:  Reviewed with the patient the labs and  ct. She is to have a RUQ u/s done of her liver for evaluation of fatty liver. Elevated ANA but other labs negative. TSH slightly elevated last test will repeat.          Treatment plan was discussed with patient and shared decision making employed. The patient was encouraged to call with any additional questions or concerns.     Discussed with patient effects and side effects of medications. Medication safety was discussed. A good faith effort was made to reconcile the patient's medications.   Orders Placed This Encounter    [place]  RT UPPER QUADRANT    HEPATIC FUNCTION PANEL    THYROXINE, FREE (FREE T4)    THYROID STIMULATING HORMONE (SENSITIVE TSH)    atorvastatin (LIPITOR) 10 mg Oral Tablet    levothyroxine (SYNTHROID) 100 mcg Oral Tablet         Follow up:  No follow-ups on file.    This note was partially generated using MModal Fluency Direct system, and there may be some incorrect words, spellings, and punctuation that were not noted in checking the note before saving.    Leonie Man, MD  01/26/2020, 21:52

## 2020-01-27 ENCOUNTER — Ambulatory Visit: Payer: 59 | Attending: FAMILY MEDICINE | Admitting: FAMILY MEDICINE

## 2020-01-27 ENCOUNTER — Encounter (INDEPENDENT_AMBULATORY_CARE_PROVIDER_SITE_OTHER): Payer: Self-pay | Admitting: FAMILY MEDICINE

## 2020-01-27 ENCOUNTER — Other Ambulatory Visit: Payer: Self-pay

## 2020-01-27 VITALS — BP 150/90 | HR 81 | Temp 97.3°F | Resp 20 | Ht 64.0 in | Wt 216.4 lb

## 2020-01-27 DIAGNOSIS — Z8659 Personal history of other mental and behavioral disorders: Secondary | ICD-10-CM

## 2020-01-27 DIAGNOSIS — Z7984 Long term (current) use of oral hypoglycemic drugs: Secondary | ICD-10-CM | POA: Insufficient documentation

## 2020-01-27 DIAGNOSIS — I10 Essential (primary) hypertension: Secondary | ICD-10-CM

## 2020-01-27 DIAGNOSIS — G2581 Restless legs syndrome: Secondary | ICD-10-CM | POA: Insufficient documentation

## 2020-01-27 DIAGNOSIS — E119 Type 2 diabetes mellitus without complications: Secondary | ICD-10-CM | POA: Insufficient documentation

## 2020-01-27 DIAGNOSIS — R7401 Elevation of levels of liver transaminase levels: Secondary | ICD-10-CM

## 2020-01-27 DIAGNOSIS — E039 Hypothyroidism, unspecified: Secondary | ICD-10-CM

## 2020-01-27 DIAGNOSIS — E782 Mixed hyperlipidemia: Secondary | ICD-10-CM | POA: Insufficient documentation

## 2020-01-27 DIAGNOSIS — F32A Depression, unspecified: Secondary | ICD-10-CM | POA: Insufficient documentation

## 2020-01-27 LAB — THYROXINE, FREE (FREE T4): THYROXINE (T4), FREE: 0.97 ng/dL (ref 0.70–1.25)

## 2020-01-27 LAB — HEPATIC FUNCTION PANEL
ALBUMIN: 4.5 g/dL (ref 3.4–4.8)
ALKALINE PHOSPHATASE: 70 U/L (ref 50–130)
ALT (SGPT): 38 U/L — ABNORMAL HIGH (ref 8–22)
AST (SGOT): 31 U/L (ref 8–45)
BILIRUBIN DIRECT: 0.4 mg/dL (ref 0.1–0.4)
BILIRUBIN TOTAL: 1.4 mg/dL — ABNORMAL HIGH (ref 0.3–1.3)
PROTEIN TOTAL: 7.4 g/dL (ref 6.0–8.0)

## 2020-01-27 LAB — THYROID STIMULATING HORMONE (SENSITIVE TSH): TSH: 4.993 u[IU]/mL — ABNORMAL HIGH (ref 0.430–3.550)

## 2020-01-27 MED ORDER — ATORVASTATIN 10 MG TABLET
10.0000 mg | ORAL_TABLET | Freq: Every day | ORAL | 1 refills | Status: DC
Start: 2020-01-27 — End: 2020-08-24

## 2020-01-27 MED ORDER — LEVOTHYROXINE 100 MCG TABLET
100.0000 ug | ORAL_TABLET | Freq: Every morning | ORAL | 1 refills | Status: DC
Start: 2020-01-27 — End: 2020-09-20

## 2020-01-27 NOTE — Nursing Note (Signed)
PT. Here for recheck. Pt. Needs refills on atorvastatin, gabapentin, levothyroxine. Pt. C/o elevated BS. Pt. Was on a antidepressant but is out. Pt. Is not sure of the name of it.  Pt. Asking about her ANA results and what she needs to do.Corie Chiquito, RN

## 2020-01-28 ENCOUNTER — Encounter

## 2020-01-28 ENCOUNTER — Telehealth (INDEPENDENT_AMBULATORY_CARE_PROVIDER_SITE_OTHER): Payer: Self-pay | Admitting: FAMILY MEDICINE

## 2020-01-28 DIAGNOSIS — E039 Hypothyroidism, unspecified: Secondary | ICD-10-CM

## 2020-01-28 NOTE — Telephone Encounter (Signed)
Pt. Notified and verbalized understanding. Analysa Nutting, RN

## 2020-01-28 NOTE — Telephone Encounter (Signed)
Please let this patient know the results of her labs. Have her call if she has not heard about an apt for her RUQ u/s. Also her TSH is still about the same slightly off and her FT4 is normal. I see where the patient is on Biotin that can sometime interfere with the TSH. Have her hold the Biotin a week prior to her repeat TSH and FT4 in 3 months.

## 2020-01-29 ENCOUNTER — Telehealth (INDEPENDENT_AMBULATORY_CARE_PROVIDER_SITE_OTHER): Payer: Self-pay | Admitting: FAMILY MEDICINE

## 2020-01-29 NOTE — Telephone Encounter (Signed)
Called and left a message that the appt for the [place] ruq is scheduled for Jan 15th @ 10:15 and arrive 30 min early and NPO after midnight the night before

## 2020-02-06 ENCOUNTER — Ambulatory Visit
Admission: RE | Admit: 2020-02-06 | Discharge: 2020-02-06 | Disposition: A | Discharge status: Home / Self Care | Payer: 59 | Source: Ambulatory Visit | Attending: FAMILY MEDICINE | Admitting: FAMILY MEDICINE

## 2020-02-06 ENCOUNTER — Other Ambulatory Visit: Payer: Self-pay

## 2020-02-06 DIAGNOSIS — R7401 Elevation of levels of liver transaminase levels: Secondary | ICD-10-CM

## 2020-02-07 ENCOUNTER — Other Ambulatory Visit (HOSPITAL_COMMUNITY): Payer: 59

## 2020-02-12 ENCOUNTER — Ambulatory Visit
Admission: RE | Admit: 2020-02-12 | Discharge: 2020-02-12 | Disposition: A | Discharge status: Home / Self Care | Payer: 59 | Source: Ambulatory Visit | Attending: FAMILY MEDICINE | Admitting: FAMILY MEDICINE

## 2020-02-12 ENCOUNTER — Other Ambulatory Visit: Payer: Self-pay

## 2020-02-12 DIAGNOSIS — R7401 Elevation of levels of liver transaminase levels: Secondary | ICD-10-CM | POA: Insufficient documentation

## 2020-02-17 ENCOUNTER — Other Ambulatory Visit (INDEPENDENT_AMBULATORY_CARE_PROVIDER_SITE_OTHER): Payer: Self-pay | Admitting: FAMILY MEDICINE

## 2020-03-02 ENCOUNTER — Telehealth (INDEPENDENT_AMBULATORY_CARE_PROVIDER_SITE_OTHER): Payer: Self-pay | Admitting: FAMILY MEDICINE

## 2020-03-02 NOTE — Telephone Encounter (Signed)
Pt states she wasn't for sure what it was for, but if you say that its for her to do her annual screening/physical, she can do that during her appt in May. cn

## 2020-03-02 NOTE — Telephone Encounter (Signed)
Please check with the patient regarding annual [facility] physical and screening exam sheet that she had dropped off.  Check to see if she plans to have her May appointment as this routine physical?

## 2020-03-17 ENCOUNTER — Other Ambulatory Visit (INDEPENDENT_AMBULATORY_CARE_PROVIDER_SITE_OTHER): Payer: Self-pay | Admitting: FAMILY MEDICINE

## 2020-03-21 ENCOUNTER — Other Ambulatory Visit (INDEPENDENT_AMBULATORY_CARE_PROVIDER_SITE_OTHER): Payer: Self-pay | Admitting: FAMILY MEDICINE

## 2020-05-18 ENCOUNTER — Other Ambulatory Visit (INDEPENDENT_AMBULATORY_CARE_PROVIDER_SITE_OTHER): Payer: Self-pay | Admitting: FAMILY MEDICINE

## 2020-05-29 NOTE — Progress Notes (Signed)
Family Medicine, [facility]  [address]  [place] [address]  [phone]      Patient Name:  Gwendolyn Crawford  MRN:  [id number]  DOB:  04-01-1955    Date of Service:  05/30/2020    Chief Complaint:   Chief Complaint   Patient presents with    Follow Up 3 Months       Subjective:  Gwendolyn Crawford is a 65 y.o. female who returns for a f/u apt. She has been walking and following a weight watchers diet and has lost weight. She states that her BP and sugars have been doing well. She has had some lower back pain. It is her usual pain but more prominent. She denies any radiation of pain down her leg,numbness or tingling.     Past Medical History:  Past Medical History:   Diagnosis Date    Allergic rhinitis     Anxiety     Arthritis     Depression     Diabetes mellitus, type 2 (CMS HCC)     Gout     Headache     Hypercholesterolemia     Hypertension     Hypothyroid     Hypothyroidism     Insomnia     PONV (postoperative nausea and vomiting)     RLS (restless legs syndrome)     Vitamin deficiency     Wears glasses             Past Surgical History:  Past Surgical History:   Procedure Laterality Date    COLONOSCOPY      ESOPHAGOGASTRODUODENOSCOPY      HX BREAST BIOPSY Left     HX BENIGN BREAST BX UNSURE WHICH SIDE    HX CHOLECYSTECTOMY              Current Outpatient Medications   Medication Sig    acetaminophen (TYLENOL) 325 mg Oral Tablet Take 325 mg by mouth Every 4 hours as needed for Pain    allopurinoL (ZYLOPRIM) 100 mg Oral Tablet TAKE ONE TABLET BY MOUTH TWO TIMES A DAY    ascorbic acid (VITAMIN C) 1,000 mg Oral Tablet Take 1,000 mg by mouth Once a day    aspirin (ECOTRIN) 81 mg Oral Tablet, Delayed Release (E.C.) Take 81 mg by mouth Once a day    atorvastatin (LIPITOR) 10 mg Oral Tablet Take 1 Tablet (10 mg total) by mouth Once a day for 90 days    cholecalciferol, vitamin D3, 25 mcg (1,000 unit) Oral Tablet Take 1,000 Units by mouth Once a day    cinnamon bark  (CINNAMON ORAL) Take 1,000 mg by mouth bid    cyanocobalamin (VITAMIN B 12) 1,000 mcg Oral Tablet Take 1,000 mcg by mouth Once a day    gabapentin (NEURONTIN) 600 mg Oral Tablet TAKE ONE TABLET BY MOUTH THREE TIMES A DAY    glimepiride (AMARYL) 1 mg Oral Tablet Take 1 Tablet (1 mg total) by mouth Every morning Indications: type 2 diabetes mellitus    levothyroxine (SYNTHROID) 100 mcg Oral Tablet Take 1 Tablet (100 mcg total) by mouth Every morning    lisinopriL (PRINIVIL) 5 mg Oral Tablet Take 1 Tablet (5 mg total) by mouth Once a day    loratadine (CLARITIN) 10 mg Oral Tablet Take 10 mg by mouth Once a day    meclizine (ANTIVERT) 25 mg Oral Tablet Take 25 mg by mouth Every 8 hours as needed  MetFORMIN (GLUCOPHAGE) 1,000 mg Oral Tablet TAKE ONE TABLET BY MOUTH EVERY MORNING AND EVERY EVENING WITH FOOD FOR BLOOD SUGAR Indications: type 2 diabetes mellitus    multivit with minerals/lutein (MULTIVITAMIN 50 PLUS ORAL) Take by mouth    naproxen Sodium (ALEVE) 220 mg Oral Tablet Take 220 mg by mouth Every 12 hours as needed     triamcinolone acetonide (ARISTOCORT) 0.5 % Cream        Allergies   Allergen Reactions    Zocor [Simvastatin] Myalgia    Crestor [Rosuvastatin]  Other Adverse Reaction (Add comment) and Myalgia     unknown       Objective:    BP 122/84    Pulse 83    Temp 35.9 C (96.6 F)    Resp 20    Ht 1.626 m (5\' 4" )    Wt 92.5 kg (204 lb)    SpO2 98%    BMI 35.02 kg/m       Body mass index is 35.02 kg/m.     Physical Exam:  Constitutional: she is oriented to person, place, and time and well-developed, well-nourished, and in no distress.   HENT: tm and canals clear on the left and cerumen on the right. No erythema or exudate.    Head: Normocephalic.   Eyes: Pupils are equal, round, and reactive to light. Conjunctivae and EOM are normal. Right eye exhibits no discharge. Left eye exhibits no discharge.   Neck: Normal range of motion. Neck supple. No thyromegaly present.   Cardiovascular: Normal  rate, regular rhythm, normal heart sounds and intact distal pulses. Exam reveals no friction rub.   No murmur heard.  Pulmonary/Chest: Breath sounds normal. No respiratory distress. she  has no wheezes. she  has no rales.   Musculoskeletal:         General: No tenderness or edema.   Lymphadenopathy:     she  has no cervical adenopathy.   Neurological she  is alert and oriented to person, place, and time.   Skin: Skin is warm and dry.   Psychiatric: Affect normal.    Diabetic foot exam:  No edema bilaterally. Pulses normal bilaterally. Heel callouses.        Data reviewed:  HEMOGLOBIN A1C  Lab Results   Component Value Date    HA1C 7.6 (H) 09/21/2019     BASIC METABOLIC PANEL  Lab Results   Component Value Date    SODIUM 141 12/11/2019    POTASSIUM 4.2 12/11/2019    CHLORIDE 104 12/11/2019    CO2 27 12/11/2019    ANIONGAP 10 12/11/2019    BUN 10 12/11/2019    CREATININE 0.78 12/11/2019    BUNCRRATIO 13 12/11/2019    GFR 81 12/11/2019    CALCIUM 10.2 12/11/2019    GLUCOSENF 132 (H) 12/11/2019      COMPLETE BLOOD COUNT   Lab Results   Component Value Date    WBC 7.1 11/23/2019    HGB 14.6 11/23/2019    HCT 45.2 11/23/2019    PLTCNT 249 11/23/2019       DIFFERENTIAL  Lab Results   Component Value Date    PMNS 53 05/12/2018    MONOCYTES 6 05/12/2018    BASOPHILS 0 05/12/2018    BASOPHILS <0.10 05/12/2018    PMNABS 2.96 05/12/2018    LYMPHSABS 2.17 05/12/2018    EOSABS 0.18 05/12/2018    MONOSABS 0.33 05/12/2018        Recent Results (from the past 05/14/2018 hour(s))  MAMMO BILATERAL SCREENING-ADDL VIEWS/BREAST US AS REQ BY RAD    Collection Time: 01/04/20  9:49 AM    Narrative    Computer aided detection (CAD) technology has been applied to the standard   (CC and MLO) images.    2D images were acquired and reviewed.    FILMS COMPARED:  Compared to: 10/27/2018 MAMMO BILATERAL SCREENING-ADDL VIEWS/BREAST US AS   REQ BY RAD and 10/24/2017 MAMMO BILAT SCREENING WO TOMO-ADDL VIEWS/BREAST   US AS REQ BY  RAD    HISTORY:  Procedure: MAMMO BILATERAL SCREENING WO TOMO-ADDL VIEWS/BREAST US AS REQ   BY RAD  Reason for exam: Visit for screening mammogram      FINDINGS:  The breasts have scattered areas of fibroglandular density.    Bilateral  There is no evidence of suspicious masses, calcifications, or other   abnormal findings.      Impression    Follow up mammogram in 1 year is recommended for both breasts.    BI-RADS ATLAS category (overall): 1 - Negative        Social Determinants identified with potential adverse effects on health outcomes:  none           Assessment/Plan:  (E11.9) Type 2 diabetes mellitus (CMS HCC)  (primary encounter diagnosis)  Plan: CBC, HEP-2 SUBSTRATE ANTINUCLEAR ANTIBODIES         (ANA), SERUM, HGA1C (HEMOGLOBIN A1C WITH EST         AVG GLUCOSE)    (E03.9) Hypothyroidism (acquired)  Plan: HEP-2 SUBSTRATE ANTINUCLEAR ANTIBODIES (ANA),         SERUM    (E78.2) Hyperlipidemia, mixed  Plan: HEP-2 SUBSTRATE ANTINUCLEAR ANTIBODIES (ANA),         SERUM    (I10) Primary hypertension  Plan: HEP-2 SUBSTRATE ANTINUCLEAR ANTIBODIES (ANA),         SERUM    (G25.81) RLS (restless legs syndrome)  Plan: HEP-2 SUBSTRATE ANTINUCLEAR ANTIBODIES (ANA),         SERUM    (F41.9) Anxiety  Plan: HEP-2 SUBSTRATE ANTINUCLEAR ANTIBODIES (ANA),         SERUM    (M54.50,  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 She was reminded about her Prevnar and Pneumovax. She is to have a bone density. Discussed with the patient current recommendations about Aspirin. She is to have xrays of her back today. She states that when she had learnt of her elevated ANA in the past she had too much going on and denied a referral. Will recheck her ANA today and if elevated she would be  willing to see a rheumatologist. She is to f/u in 4 months but earlier if any issues.          Treatment plan was discussed with patient and shared decision making employed. The patient was encouraged to call with any additional questions or concerns.     Discussed with patient effects and side effects of medications. Medication safety was discussed. A good faith effort was made to reconcile the patient's medications.   Orders Placed This Encounter    XR LUMBAR SPINE SERIES    BONE DENSITOMETRY COMPARISON    CBC    HEP-2 SUBSTRATE ANTINUCLEAR  ANTIBODIES (ANA), SERUM    HEPATIC FUNCTION PANEL    HGA1C (HEMOGLOBIN A1C WITH EST AVG GLUCOSE)    MetFORMIN (GLUCOPHAGE) 1,000 mg Oral Tablet    glimepiride (AMARYL) 1 mg Oral Tablet         Follow up:  No follow-ups on file.    This note was partially generated using MModal Fluency Direct system, and there may be some incorrect words, spellings, and punctuation that were not noted in checking the note before saving.    Leonie Man, MD  05/29/2020, 13:42

## 2020-05-30 ENCOUNTER — Other Ambulatory Visit: Payer: Self-pay

## 2020-05-30 ENCOUNTER — Encounter (INDEPENDENT_AMBULATORY_CARE_PROVIDER_SITE_OTHER): Payer: Self-pay | Admitting: FAMILY MEDICINE

## 2020-05-30 ENCOUNTER — Telehealth (INDEPENDENT_AMBULATORY_CARE_PROVIDER_SITE_OTHER): Payer: Self-pay | Admitting: FAMILY MEDICINE

## 2020-05-30 ENCOUNTER — Ambulatory Visit: Payer: Medicare Other | Attending: FAMILY MEDICINE | Admitting: FAMILY MEDICINE

## 2020-05-30 ENCOUNTER — Ambulatory Visit (HOSPITAL_COMMUNITY)
Admission: RE | Admit: 2020-05-30 | Discharge: 2020-05-30 | Disposition: A | Discharge status: Home / Self Care | Payer: Medicare Other | Source: Ambulatory Visit | Attending: FAMILY MEDICINE | Admitting: FAMILY MEDICINE

## 2020-05-30 VITALS — BP 122/84 | HR 83 | Temp 96.6°F | Resp 20 | Ht 64.0 in | Wt 204.0 lb

## 2020-05-30 DIAGNOSIS — E119 Type 2 diabetes mellitus without complications: Secondary | ICD-10-CM | POA: Insufficient documentation

## 2020-05-30 DIAGNOSIS — M545 Low back pain, unspecified: Secondary | ICD-10-CM | POA: Insufficient documentation

## 2020-05-30 DIAGNOSIS — N959 Unspecified menopausal and perimenopausal disorder: Secondary | ICD-10-CM | POA: Insufficient documentation

## 2020-05-30 DIAGNOSIS — E782 Mixed hyperlipidemia: Secondary | ICD-10-CM | POA: Insufficient documentation

## 2020-05-30 DIAGNOSIS — Z7984 Long term (current) use of oral hypoglycemic drugs: Secondary | ICD-10-CM | POA: Insufficient documentation

## 2020-05-30 DIAGNOSIS — G2581 Restless legs syndrome: Secondary | ICD-10-CM | POA: Insufficient documentation

## 2020-05-30 DIAGNOSIS — G8929 Other chronic pain: Secondary | ICD-10-CM | POA: Insufficient documentation

## 2020-05-30 DIAGNOSIS — F419 Anxiety disorder, unspecified: Secondary | ICD-10-CM | POA: Insufficient documentation

## 2020-05-30 DIAGNOSIS — E039 Hypothyroidism, unspecified: Secondary | ICD-10-CM | POA: Insufficient documentation

## 2020-05-30 DIAGNOSIS — R7989 Other specified abnormal findings of blood chemistry: Secondary | ICD-10-CM | POA: Insufficient documentation

## 2020-05-30 DIAGNOSIS — I10 Essential (primary) hypertension: Secondary | ICD-10-CM | POA: Insufficient documentation

## 2020-05-30 LAB — HEPATIC FUNCTION PANEL
ALBUMIN: 4.4 g/dL (ref 3.4–4.8)
ALKALINE PHOSPHATASE: 63 U/L (ref 50–130)
ALT (SGPT): 32 U/L — ABNORMAL HIGH (ref 8–22)
AST (SGOT): 26 U/L (ref 8–45)
BILIRUBIN DIRECT: 0.5 mg/dL — ABNORMAL HIGH (ref 0.1–0.4)
BILIRUBIN TOTAL: 1.3 mg/dL (ref 0.3–1.3)
PROTEIN TOTAL: 7.2 g/dL (ref 6.0–8.0)

## 2020-05-30 LAB — CBC
HCT: 42.4 % (ref 34.8–46.0)
HGB: 14.4 g/dL (ref 11.5–16.0)
MCH: 30.9 pg (ref 26.0–32.0)
MCHC: 34 g/dL (ref 31.0–35.5)
MCV: 91 fL (ref 78.0–100.0)
MPV: 11.9 fL (ref 8.7–12.5)
PLATELETS: 206 10*3/uL (ref 150–400)
RBC: 4.66 10*6/uL (ref 3.85–5.22)
RDW-CV: 13.2 % (ref 11.5–15.5)
WBC: 6.9 10*3/uL (ref 3.7–11.0)

## 2020-05-30 LAB — HGA1C (HEMOGLOBIN A1C WITH EST AVG GLUCOSE)
ESTIMATED AVERAGE GLUCOSE: 166 mg/dL
HEMOGLOBIN A1C: 7.4 % — ABNORMAL HIGH (ref 4.0–6.0)

## 2020-05-30 LAB — THYROXINE, FREE (FREE T4): THYROXINE (T4), FREE: 1.08 ng/dL (ref 0.70–1.25)

## 2020-05-30 LAB — THYROID STIMULATING HORMONE (SENSITIVE TSH): TSH: 4.8 u[IU]/mL — ABNORMAL HIGH (ref 0.430–3.550)

## 2020-05-30 MED ORDER — GLIMEPIRIDE 1 MG TABLET
1.0000 mg | ORAL_TABLET | Freq: Every morning | ORAL | 1 refills | Status: DC
Start: 2020-05-30 — End: 2020-09-14

## 2020-05-30 MED ORDER — METFORMIN 1,000 MG TABLET
ORAL_TABLET | ORAL | 1 refills | Status: DC
Start: 2020-05-30 — End: 2020-10-03

## 2020-05-30 NOTE — Telephone Encounter (Signed)
-----   Message from Leonie Man, MD sent at 05/30/2020  1:21 PM EDT -----  Please let this patient know the results of her xrays. Check with her about having physical therapy done for her back. If persistent may need to have further testing.

## 2020-05-30 NOTE — Telephone Encounter (Signed)
Pt. Notified and states that she does not want to have physical therapy at this time.Corie Chiquito, RN

## 2020-05-30 NOTE — Result Encounter Note (Signed)
Please let this patient know the results of her xrays. Check with her about having physical therapy done for her back. If persistent may need to have further testing.

## 2020-05-30 NOTE — Nursing Note (Signed)
PT. Here for recheck. Pt. Needs refill on Gabapentin, glimirpide and metformin. Pt. Got chills last covid booster injection.  Pt. 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 PT. Still having back pain. Pt. has petachiae to her arms. Corie Chiquito, RN

## 2020-06-01 ENCOUNTER — Telehealth (INDEPENDENT_AMBULATORY_CARE_PROVIDER_SITE_OTHER): Payer: Self-pay | Admitting: FAMILY MEDICINE

## 2020-06-01 NOTE — Telephone Encounter (Addendum)
Called pt to let her know that her bone density is scheduled for May 19th @ 11:30 and to arrive 30 min early to register and no osteo meds or calcium supplements; no record of last dexa scan  [employer], Office Staff  06/01/2020, 08:18

## 2020-06-03 ENCOUNTER — Telehealth (INDEPENDENT_AMBULATORY_CARE_PROVIDER_SITE_OTHER): Payer: Self-pay | Admitting: FAMILY MEDICINE

## 2020-06-03 DIAGNOSIS — M549 Dorsalgia, unspecified: Secondary | ICD-10-CM

## 2020-06-03 DIAGNOSIS — R768 Other specified abnormal immunological findings in serum: Secondary | ICD-10-CM

## 2020-06-03 LAB — ANA REFLEX
ANA FINAL INTERPRETATION: POSITIVE — AB
ANA TITER: 1:320 {titer}

## 2020-06-03 NOTE — Telephone Encounter (Signed)
Please let this patient know the results of her labs. Let her know referral placed for rheumatology was placed.

## 2020-06-08 NOTE — Telephone Encounter (Signed)
Please let the patient know that a referral was placed for her to do telemedicine with the nurse practitioner here at the outpatient specialty clinic.  If patient would like to be seen in person at [facility] let me know and I will place the referral.  Check to see if the patient needs a referral to see the chiropractor.  I am not able to place it in epic.

## 2020-06-08 NOTE — Telephone Encounter (Signed)
Patient notified of labs and understands about Rheumatology and would rather go in the direction of [place].  She request an order to go to [facility] instead of PT.  Would you write an order to be picked up tomorrow.  She will be over here tomorrow for her bone density.  Waynetta Sandy, MA

## 2020-06-09 ENCOUNTER — Other Ambulatory Visit: Payer: Self-pay

## 2020-06-09 ENCOUNTER — Telehealth (INDEPENDENT_AMBULATORY_CARE_PROVIDER_SITE_OTHER): Payer: Self-pay | Admitting: FAMILY MEDICINE

## 2020-06-09 ENCOUNTER — Ambulatory Visit
Admission: RE | Admit: 2020-06-09 | Discharge: 2020-06-09 | Disposition: A | Discharge status: Home / Self Care | Payer: Medicare Other | Source: Ambulatory Visit | Attending: FAMILY MEDICINE | Admitting: FAMILY MEDICINE

## 2020-06-09 DIAGNOSIS — N959 Unspecified menopausal and perimenopausal disorder: Secondary | ICD-10-CM | POA: Insufficient documentation

## 2020-06-09 NOTE — Result Encounter Note (Signed)
Please let this patient know that the results of her bone density.

## 2020-06-09 NOTE — Telephone Encounter (Signed)
-----   Message from Leonie Man, MD sent at 06/09/2020  1:50 PM EDT -----  Please let this patient know that the results of her bone density.

## 2020-06-09 NOTE — Telephone Encounter (Signed)
She needs the referral on a RX form.  Just written like a RX  Gailene Youkhana, MA

## 2020-06-09 NOTE — Telephone Encounter (Signed)
Order placed as a referral which was the only option in Epic. It can be printed for the patient to take to Dr.Godwin as well as her lumbar xray results.

## 2020-06-10 NOTE — Telephone Encounter (Signed)
Printed and mailed to patient  Diany Formosa, MA

## 2020-06-13 ENCOUNTER — Telehealth (INDEPENDENT_AMBULATORY_CARE_PROVIDER_SITE_OTHER): Payer: 59 | Admitting: NURSE PRACTITIONER-ADULT HEALTH

## 2020-06-13 ENCOUNTER — Ambulatory Visit: Payer: Medicare Other | Attending: Rheumatology

## 2020-06-13 ENCOUNTER — Encounter (INDEPENDENT_AMBULATORY_CARE_PROVIDER_SITE_OTHER): Payer: Self-pay | Admitting: NURSE PRACTITIONER-ADULT HEALTH

## 2020-06-13 ENCOUNTER — Other Ambulatory Visit: Payer: Self-pay

## 2020-06-13 DIAGNOSIS — R768 Other specified abnormal immunological findings in serum: Secondary | ICD-10-CM | POA: Insufficient documentation

## 2020-06-13 DIAGNOSIS — M549 Dorsalgia, unspecified: Secondary | ICD-10-CM

## 2020-06-13 LAB — SEDIMENTATION RATE: ERYTHROCYTE SEDIMENTATION RATE (ESR): 5 mm/h (ref 0–20)

## 2020-06-13 LAB — C-REACTIVE PROTEIN(CRP),INFLAMMATION: CRP INFLAMMATION: <0.4 mg/L (ref <8.0)

## 2020-06-13 NOTE — Patient Instructions (Signed)
Labs today. Will call you with the results.   Avoid NSAID's due to elevated liver enzymes.   Follow up by phone with lab results.   Call with questions [phone]

## 2020-06-13 NOTE — H&P (Signed)
[facility]  Telemedicine [place]  [address]  [place],  [place] [address]      Gwendolyn Crawford, 65 y.o. female  Date of Service: 06/13/2020  Date of Birth:  [birth year]    Reason for Consult:  Elevated ANA     Subjective    Gwendolyn Crawford is a 65 y.o. year old female who presents for evaluation of ANA. Patient reports that she has arthritis pain in her back, hands and legs. Pain started in 2013, she states at the time she was working and retired. She reports that her knees and fingers hurt the most. She states that she has had worsening lower back pain since Easter. Patient had a x-ray of lower back showing moderate to severe disc space narrowing with facet arthropathy. Reports having increase in fatigue. She denies rashes, unexplained weight loss, fluid around heart and lungs, denies trouble with liver or NSAID use. She takes Tylenol arthritis at home just when really needs it. She does have a history of gout. Reports that she does have trouble sleeping at night. 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 Denies any morning stiffness. Denies that prednisone ever helped. Denies any family history of lupus or inflammatory diseases. Denies smoking or drinking.     REVIEW OF SYSTEMS     Constitutional: denies anorexia, reports fatigue, denies fevers, denies chills, night sweats, reports weight loss, denies weight gain.  Eyes: denies blurry vision, double vision, or eye pain. Wears glasses.   ENT: denies difficulty swallowing, epistaxis, nasal discharge, oral lesions, tinnitus, or vocal changes.  Cardiovascular: denies chest pain, chest pressure/discomfort, irregular heartbeat, denies lower extremity edema, denies orthopnea, denies palpitations, positive for dizziness.   Respiratory: denies dry cough, denies dyspnea on exertion, denies emphysema,  pleurisy/chest pain, sputum, or wheezing.   Gastrointestinal: denies abdominal pain, constipation, diarrhea, jaundice, melena, nausea, reflux symptoms, or vomiting.   Musculoskeletal: positive arthralgias, denies generalized muscle aches.   Neurological: denies dizziness with position changes, denies headache, denies gait problems, memory problems, speech problems, tremors, vertigo, or weakness.   Endocrine: denies hot flashes, mood swings, skin changes, temperature intolerance, or unexpected weight changes, denies change in appetite, denies sweating.   Hematologic/Lymphatic: denies bleeding problems, denies easy bruising.   Allergic/Immunological: denies hives, insect bite sensitivity, or nasal congestion.  Dermatological: denies acne, eczema, lumps, rash, or skin lesion changes.  Genitourinary: denies any urinary urgency, denies incontinence, denies blood in urine.   Psychiatric: denies anxiety, denies depression.      Recent infections or hospitalizations since last visit:     Tolerating medications well.     Current Outpatient Medications   Medication Sig    acetaminophen (TYLENOL) 325 mg Oral Tablet Take 325 mg by mouth Every 4 hours as needed for Pain    allopurinoL (ZYLOPRIM) 100 mg Oral Tablet TAKE ONE TABLET BY MOUTH TWO TIMES A DAY    ascorbic acid (VITAMIN C) 1,000 mg Oral Tablet Take 1,000 mg by mouth Once a day    aspirin (ECOTRIN) 81 mg Oral Tablet, Delayed Release (E.C.) Take 81 mg by mouth Once a day    atorvastatin (LIPITOR) 10 mg Oral Tablet Take 1 Tablet (10 mg total) by mouth Once a day for 90 days    cholecalciferol, vitamin D3, 25 mcg (1,000 unit) Oral Tablet Take 1,000 Units  by mouth Once a day    cinnamon bark (CINNAMON ORAL) Take 1,000 mg by mouth bid    cyanocobalamin (VITAMIN B 12) 1,000 mcg Oral Tablet Take 1,000 mcg by mouth Once a day    gabapentin (NEURONTIN) 600 mg Oral Tablet TAKE ONE TABLET BY MOUTH THREE TIMES A DAY    glimepiride (AMARYL) 1 mg Oral Tablet Take 1 Tablet (1 mg  total) by mouth Every morning Indications: type 2 diabetes mellitus    levothyroxine (SYNTHROID) 100 mcg Oral Tablet Take 1 Tablet (100 mcg total) by mouth Every morning    lisinopriL (PRINIVIL) 5 mg Oral Tablet Take 1 Tablet (5 mg total) by mouth Once a day    loratadine (CLARITIN) 10 mg Oral Tablet Take 10 mg by mouth Once a day    meclizine (ANTIVERT) 25 mg Oral Tablet Take 25 mg by mouth Every 8 hours as needed     MetFORMIN (GLUCOPHAGE) 1,000 mg Oral Tablet TAKE ONE TABLET BY MOUTH EVERY MORNING AND EVERY EVENING WITH FOOD FOR BLOOD SUGAR Indications: type 2 diabetes mellitus    multivit with minerals/lutein (MULTIVITAMIN 50 PLUS ORAL) Take by mouth    naproxen Sodium (ALEVE) 220 mg Oral Tablet Take 220 mg by mouth Every 12 hours as needed     triamcinolone acetonide (ARISTOCORT) 0.5 % Cream        Family history, medical history and surgical history reviewed in the system and updated to reflect any changes since last visit    Objective:  BP 108/63   Pulse 97   Resp 16   Ht 1.626 m (5\' 4" )   Wt 92.1 kg (203 lb)   SpO2 98%   BMI 34.84 kg/m   Physical Exam:  Constitutional: AAOx3, NAD  HEENT: Normocephalic, atraumatic. PERRLA.   Neck: Trachea midline, supple  Cardiovascular: RRR; No murmurs, rubs, or gallops present. S1, S2 normal.  Pulmonary: Lungs CTA bilaterally; No wheezes, rales, or rhonchi observed. Normal respiratory effort. No retractions.  Abdomen: Abdomen soft and supple; No tenderness. Non-distended; No masses or HSM present.   Extremities: No peripheral edema; No cyanosis or clubbing of nails.  Musculoskeletal: Normal muscle strength and tone of all four extremities.  Skin: No rashes or lesions present; Warm and dry. No jaundice.  Psychiatric: Normal mood and affect; pleasant.   Labs/X-rays/Work up  Reviewed and no concerns except what is discussed with patient below  Office Visit on 05/30/2020   Component Date Value Ref Range Status    THYROXINE (T4), FREE 05/30/2020 1.08  0.70 - 1.25 ng/dL  Final    TSH 07/30/2020 4.800 (A) 0.430 - 3.550 uIU/mL Final    Pregnant patients have trimester-specific ranges derived from this analyzer/technology. For singleton pregnancies:    First trimester, 0.15 - 2.45 uIU/mL  Second trimester, 0.35 - 3.20 uIU/mL  Third trimester, 0.40 - 4.30 uIU/mL    WBC 05/30/2020 6.9  3.7 - 11.0 x10^3/uL Final    RBC 05/30/2020 4.66  3.85 - 5.22 x10^6/uL Final    HGB 05/30/2020 14.4  11.5 - 16.0 g/dL Final    HCT 07/30/2020 42.4  34.8 - 46.0 % Final    MCV 05/30/2020 91.0  78.0 - 100.0 fL Final    MCH 05/30/2020 30.9  26.0 - 32.0 pg Final    MCHC 05/30/2020 34.0  31.0 - 35.5 g/dL Final    RDW-CV 07/30/2020 13.2  11.5 - 15.5 % Final    PLATELETS 05/30/2020 206  150 - 400 x10^3/uL Final  MPV 05/30/2020 11.9  8.7 - 12.5 fL Final    ALBUMIN 05/30/2020 4.4  3.4 - 4.8 g/dL  Final    ALKALINE PHOSPHATASE 05/30/2020 63  50 - 130 U/L Final    ALT (SGPT) 05/30/2020 32 (A) 8 - 22 U/L Final    AST (SGOT)  05/30/2020 26  8 - 45 U/L Final    BILIRUBIN TOTAL 05/30/2020 1.3  0.3 - 1.3 mg/dL Final    Naproxen therapy can falsely elevate total bilirubin levels.    BILIRUBIN DIRECT 05/30/2020 0.5 (A) 0.1 - 0.4 mg/dL Final    PROTEIN TOTAL 05/30/2020 7.2  6.0 - 8.0 g/dL Final    HEMOGLOBIN A1O 05/30/2020 7.4 (A) 4.0 - 6.0 % Final    ESTIMATED AVERAGE GLUCOSE 05/30/2020 166  mg/dL Final    ANA TITER 87/86/7672 1:320   Final    ANA PATTERN 05/30/2020 Speckled   Final    ANA FINAL INTERPRETATION 05/30/2020 Positive (A) Negative Final    ANA performed by gold-standard immunofluorescence technique. Positive ANA results must be interpreted in conjunction with clinical findings and other testing as appropriate.     Imaging:   Gwendolyn Crawford     PROCEDURE DESCRIPTION: XR LUMBAR SPINE SERIES     CLINICAL INDICATION: M54.50: Chronic midline low back pain without sciatica  G89.29: Chronic midline low back pain without sciatica     TECHNIQUE: 5 views / 5 images submitted.     COMPARISON: 08/04/2015       IMPRESSION:    FINDINGS: Impression: Tiny ribs at T12. Moderate to severe disc space narrowing at L5-S1 and L4-5. Multilevel moderate disc space narrowing at L3-L4. Moderate lower lumbar facet arthropathy. No evidence of acute fracture or subluxation. Degenerative findings have slightly progressed from prior.   Radiologist location ID: [id number]    Assessment/Plan:     1. Discussed with patient the meaning of elevated ANA lab work. An elevated ANA can be seen in lupus or in other medical conditions as well as in a healthy person. Elevated ANA does not have any correlation with back pain. We do not feel that it is clinically related but will complete workup of Lupus, although pain in joints does not correlate with Lupus.   2. Once workup is completed, will follow up with patient.   3. Elevated bilirubin and ALT will require additional workup with PCP. Ultrasound of liver is negative. Patient feels pain started long after statin therapy was started. She denies any alcohol use or NSAID use.     Orders Placed This Encounter    ANTI-DOUBLE STRANDED DNA AB    C4 Complement    C3 Complement    SM (SMITH) ANTIBODIES, IGG, SERUM    RNP ANTIBODIES, IGG, SERUM    CHROMATIN ANTIBODIES, IGG, SERUM    Sedimentation Rate    C-REACTIVE PROTEIN(CRP),INFLAMMATION    Rheumatoid Factor    CYCLIC CITRULLINATED PEPTIDE ANTIBODIES, IGG, SERUM    SMOOTH MUSCLE ANTIBODIES, SERUM     Follow-up:  One week by phone with lab results    TELEMEDICINE DOCUMENTATION:    Patient Location:  [facility] [place], [address], [place], [place] [address]    Patient/family aware of provider location:  yes  Patient/family consent for telemedicine:  yes  Examination observed and performed by:  Gwendolyn Hanks, APRN    This patient was seen in the [facility] with Gwendolyn Hanks, APRN performing assessment and charting. Dr. Tenna Child participating in assessment and plan of patient.  Gwendolyn MurdersJoAnn Allen Rishav Rockefeller, MD  Department  of Medicine, Section of Rheumatology  [facility] [employer]    Gwendolyn HanksStacy Brown, APRN  [facility]  [place]      I personally participated and evaluated the patient as part of a collaborative telemedicine service.  See Mrs. Manson PasseyBrown 's note for additional information.  My findings from this visit are:  Patient with back pain and positive ANA.  1. No significant signs or symptoms suggesting lupus.  No rashes on exam.  No synovitis on exam.  Back pain is not likely to be related to a positive ANA.  In terms of the positive ANA, we will evaluate further with extractable nuclear her antigens.  However with her signs or symptoms otherwise I do not think that a connective tissue disease such as lupus is likely.  Her back pain may be degenerative in origin.  Recommend follow-up with her primary care provider for further evaluation.  She does have elevated liver enzymes.  I do not know if she has had an ultrasound of the liver but I believe she has.  Could consider autoimmune hepatitis if her liver enzymes are elevated with the positive ANA and this would require GI evaluation.  She is on a statin and that could also be considered.  She may well have osteoarthritis in her hands, again unrelated to the positive ANA.  Will call her with the lab results.      Alwyn PeaJo Ann Allen Ehan Freas, MD

## 2020-06-13 NOTE — Nursing Note (Signed)
Pt did not have a med list with them at visit - went over list with patient   Advised patient to check AVS list with medicines at home and call if any discrepancies    -Confirmed correct pharmacy with patient for e-scribing

## 2020-06-14 LAB — CHROMATIN ANTIBODIES, IGG, SERUM: CHROMATIN NUCLEOSOMAL ANTIBODIES QUAL: NEGATIVE

## 2020-06-14 LAB — CYCLIC CITRULLINATED PEPTIDE ANTIBODIES, IGG, SERUM
CYCLIC CITRULLINATED PEPTIDE ANTIBODY IGG QUAL: NEGATIVE
CYCLIC CITRULLINATED PEPTIDE ANTIBODY IGG QUANT: <0.5 U/mL (ref <3.0)

## 2020-06-14 LAB — RNP ANTIBODIES, IGG, SERUM
RNP ANTIBODIES, IGG, QUALITATIVE: NEGATIVE
RNP ANTIBODIES, IGG, QUANTITATIVE: 0.5 [AU]/ml (ref <1.0)

## 2020-06-14 LAB — C3 COMPLEMENT, SERUM: C3 COMPLEMENT: 126 mg/dL (ref 81–157)

## 2020-06-14 LAB — SM (SMITH) ANTIBODIES, IGG, SERUM: SM ANTIBODIES, IGG, QUALITATIVE: NEGATIVE

## 2020-06-14 LAB — C4 COMPLEMENT, SERUM: C4 COMPLEMENT: 23 mg/dL (ref 12–39)

## 2020-06-15 LAB — RHEUMATOID FACTOR, SERUM: RHEUMATOID FACTOR: <13 [IU]/mL (ref <=30)

## 2020-06-16 LAB — ANTI-DOUBLE STRANDED DNA AB: ANTI DNA ANTIBODY: NEGATIVE

## 2020-06-17 LAB — SMOOTH MUSCLE ANTIBODIES, SERUM: SMOOTH MUSCLE AB SCREEN, S: NEGATIVE

## 2020-06-21 ENCOUNTER — Other Ambulatory Visit (INDEPENDENT_AMBULATORY_CARE_PROVIDER_SITE_OTHER): Payer: Self-pay | Admitting: FAMILY MEDICINE

## 2020-06-27 ENCOUNTER — Telehealth (INDEPENDENT_AMBULATORY_CARE_PROVIDER_SITE_OTHER): Payer: Self-pay | Admitting: NURSE PRACTITIONER-ADULT HEALTH

## 2020-06-27 NOTE — Telephone Encounter (Signed)
-----   Message from Alwyn Pea, MD sent at 06/21/2020  7:57 AM EDT -----  All of her labs are negative or normal.  No clear signs of lupus or other connective tissue disease.  RA testing negative.  She has a positive ANA but also has elevated liver enzymes.  Recommend see GI to rule out autoimmune hepatitis or other cause.  ANA can be seen in liver disease.  Return to rheum as needed.

## 2020-07-21 ENCOUNTER — Other Ambulatory Visit (INDEPENDENT_AMBULATORY_CARE_PROVIDER_SITE_OTHER): Payer: Self-pay | Admitting: Family Medicine

## 2020-08-15 ENCOUNTER — Other Ambulatory Visit (INDEPENDENT_AMBULATORY_CARE_PROVIDER_SITE_OTHER): Payer: Self-pay | Admitting: FAMILY MEDICINE

## 2020-08-24 ENCOUNTER — Other Ambulatory Visit (INDEPENDENT_AMBULATORY_CARE_PROVIDER_SITE_OTHER): Payer: Self-pay | Admitting: FAMILY MEDICINE

## 2020-09-14 ENCOUNTER — Other Ambulatory Visit (INDEPENDENT_AMBULATORY_CARE_PROVIDER_SITE_OTHER): Payer: Self-pay | Admitting: FAMILY MEDICINE

## 2020-09-19 ENCOUNTER — Other Ambulatory Visit (INDEPENDENT_AMBULATORY_CARE_PROVIDER_SITE_OTHER): Payer: Self-pay | Admitting: FAMILY MEDICINE

## 2020-09-19 MED ORDER — GABAPENTIN 600 MG TABLET
600.0000 mg | ORAL_TABLET | Freq: Three times a day (TID) | ORAL | 0 refills | Status: DC
Start: 2020-09-19 — End: 2020-10-17

## 2020-09-20 ENCOUNTER — Other Ambulatory Visit (INDEPENDENT_AMBULATORY_CARE_PROVIDER_SITE_OTHER): Payer: Self-pay | Admitting: FAMILY MEDICINE

## 2020-10-03 ENCOUNTER — Other Ambulatory Visit (INDEPENDENT_AMBULATORY_CARE_PROVIDER_SITE_OTHER): Payer: Self-pay | Admitting: FAMILY MEDICINE

## 2020-10-04 NOTE — Progress Notes (Addendum)
Family Medicine, [facility]  [address]  [place] [address]  [phone]      Patient Name:  Gwendolyn Crawford  MRN:  [id number]  DOB:  [birth year]    Date of Service:  10/05/2020    Chief Complaint:   Chief Complaint   Patient presents with    Follow Up       Subjective:  Gwendolyn Crawford is a 65 y.o. female who returns for a f/u apt. She has lost weight over the last 2 months. She states that she has been following a weight watchers diet. She has lost 18lbs. She is still on her usual amount of meds. Discussed with the patient her sugars and BP related to her meds. Also she had not eaten till later and clinically she was hypoglycemic. She was given a graham bar and felt better. She has also been walking and as an abbraision on her second toe from her toe. She states that she has bought a larger shoe and has not been wearing it. She is to use the larger shoe.     Past Medical History:  Past Medical History:   Diagnosis Date    Allergic rhinitis     Anxiety     Arthritis     Depression     Diabetes mellitus, type 2 (CMS HCC)     Gout     Headache     Hypercholesterolemia     Hypertension     Hypothyroid     Hypothyroidism     Insomnia     PONV (postoperative nausea and vomiting)     RLS (restless legs syndrome)     Vitamin deficiency     Wears glasses             Past Surgical History:  Past Surgical History:   Procedure Laterality Date    COLONOSCOPY      ESOPHAGOGASTRODUODENOSCOPY      HX BREAST BIOPSY Left     HX BENIGN BREAST BX UNSURE WHICH SIDE    HX CHOLECYSTECTOMY      HX DENTAL EXTRACTION              Current Outpatient Medications   Medication Sig    acetaminophen (TYLENOL) 325 mg Oral Tablet Take 325 mg by mouth Every 4 hours as needed for Pain    allopurinoL (ZYLOPRIM) 100 mg Oral Tablet TAKE ONE TABLET BY MOUTH TWO TIMES A DAY    ascorbic acid (VITAMIN C) 1,000 mg Oral Tablet Take 1,000 mg by mouth Once a day    aspirin (ECOTRIN) 81 mg Oral Tablet,  Delayed Release (E.C.) Take 81 mg by mouth Once a day Pt reports Taking once weekly    atorvastatin (LIPITOR) 10 mg Oral Tablet TAKE ONE TABLET BY MOUTH EVERY EVENING FOR CHOLESTEROL    cholecalciferol, vitamin D3, 25 mcg (1,000 unit) Oral Tablet Take 1,000 Units by mouth Once a day    cinnamon bark (CINNAMON ORAL) Take 1,000 mg by mouth bid    cyanocobalamin (VITAMIN B 12) 1,000 mcg Oral Tablet Take 1,000 mcg by mouth Once a day    gabapentin (NEURONTIN) 600 mg Oral Tablet Take 1 Tablet (600 mg total) by mouth Three times a day Indications: diabetic complication causing injury to some body nerves    levothyroxine (SYNTHROID) 100 mcg Oral Tablet TAKE ONE TABLET BY MOUTH EVERY MORNING    lisinopriL (PRINIVIL) 5 mg Oral Tablet TAKE ONE TABLET BY MOUTH EVERY DAY  loratadine (CLARITIN) 10 mg Oral Tablet Take 10 mg by mouth Once a day    meclizine (ANTIVERT) 25 mg Oral Tablet Take 25 mg by mouth Every 8 hours as needed     MetFORMIN (GLUCOPHAGE) 1,000 mg Oral Tablet TAKE ONE TABLET BY MOUTH EVERY MORNING AND EVERY EVENING WITH FOOD FOR BLOOD SUGAR Indications: type 2 diabetes mellitus    multivit with minerals/lutein (MULTIVITAMIN 50 PLUS ORAL) Take by mouth    naproxen Sodium (ALEVE) 220 mg Oral Tablet Take 220 mg by mouth Every 12 hours as needed     triamcinolone acetonide (ARISTOCORT) 0.5 % Cream        Allergies   Allergen Reactions    Zocor [Simvastatin] Myalgia    Crestor [Rosuvastatin]  Other Adverse Reaction (Add comment) and Myalgia     unknown       Objective:    BP (!) 158/86 (Site: Left, Patient Position: Sitting, Cuff Size: Adult)    Pulse 62    Temp 35.8 C (96.4 F) (Oral)    Resp 18    Ht 1.626 m (5\' 4" )    Wt 84.6 kg (186 lb 6.4 oz)    SpO2 99%    BMI 32.00 kg/m       Body mass index is 32 kg/m.     Physical Exam:  Constitutional: she is oriented to person, place, and time and well-developed, well-nourished, and in no distress.   HENT: tm and canals with some cerumen bilaterally. No  erythema or exudate.   Head: Normocephalic.   Eyes: Pupils are equal, round, and reactive to light. Conjunctivae and EOM are normal. Right eye exhibits no discharge. Left eye exhibits no discharge.   Neck: Normal range of motion. Neck supple. No thyromegaly present.   Cardiovascular: Normal rate, regular rhythm, normal heart sounds and intact distal pulses. Exam reveals no friction rub.   No murmur heard.  Pulmonary/Chest: Breath sounds normal. No respiratory distress. she  has no wheezes. she  has no rales.   Musculoskeletal:         General: No tenderness or edema.   Lymphadenopathy:     she  has no cervical adenopathy.   Neurological she  is alert and oriented to person, place, and time.   Skin: Skin is warm and dry.   Psychiatric: Affect normal.    Diabetic foot exam:  No edema bilaterally. No ulcerations or lesions bilaterally. Pulses normal bilaterally. Second toe with hemorrhagic abrasion.        Data reviewed:  HEMOGLOBIN A1C  Lab Results   Component Value Date    HA1C 6.0 10/05/2020     BASIC METABOLIC PANEL  Lab Results   Component Value Date    SODIUM 145 10/05/2020    POTASSIUM 3.9 10/05/2020    CHLORIDE 109 10/05/2020    CO2 25 10/05/2020    ANIONGAP 11 10/05/2020    BUN 8 10/05/2020    CREATININE 0.68 10/05/2020    BUNCRRATIO 12 10/05/2020    GFR >90 10/05/2020    CALCIUM 10.3 (H) 10/05/2020    GLUCOSENF 74 10/05/2020         Recent Results (from the past 10/07/2020 hour(s))   MAMMO BILATERAL SCREENING-ADDL VIEWS/BREAST 15726 AS REQ BY RAD    Collection Time: 01/04/20  9:49 AM    Narrative    Computer aided detection (CAD) technology has been applied to the standard   (CC and MLO) images.    2D images were acquired and reviewed.  FILMS COMPARED:  Compared to: 10/27/2018 MAMMO BILATERAL SCREENING-ADDL VIEWS/BREAST US AS   REQ BY RAD and 10/24/2017 MAMMO BILAT SCREENING WO TOMO-ADDL VIEWS/BREAST   US AS REQ BY RAD    HISTORY:  Procedure: MAMMO BILATERAL SCREENING WO TOMO-ADDL VIEWS/BREAST US AS REQ   BY  RAD  Reason for exam: Visit for screening mammogram      FINDINGS:  The breasts have scattered areas of fibroglandular density.    Bilateral  There is no evidence of suspicious masses, calcifications, or other   abnormal findings.      Impression    Follow up mammogram in 1 year is recommended for both breasts.    BI-RADS ATLAS category (overall): 1 - Negative        Social Determinants identified with potential adverse effects on health outcomes:  none           Assessment/Plan:  (E11.9) Type 2 diabetes mellitus (CMS HCC)  (primary encounter diagnosis)  Plan: HGA1C (HEMOGLOBIN A1C WITH EST AVG GLUCOSE),         CBC, COMPREHENSIVE METABOLIC PNL, FASTING,         Refer to External Provider    (E78.2) Hyperlipidemia, mixed    (I10) Primary hypertension    (G25.81) RLS (restless legs syndrome)    (E03.9) Acquired hypothyroidism  Plan: THYROID STIMULATING HORMONE (SENSITIVE TSH)    (S90.416A) Toe abrasion  Plan: Refer to External Provider    (R63.4) Weight loss         Plan: Discussed with the patient her weight loss and need to decrease her meds. She is to stop the Amaryl. She is to call if she has not heard about her apt with the podiatrist. She is to check her toe twice a day. She is to go to the ER if any problems. She is to use her larger shoe. Will also check labs. She is to f/u in 1 month and prn.          Treatment plan was discussed with patient and shared decision making employed. The patient was encouraged to call with any additional questions or concerns.     Discussed with patient effects and side effects of medications. Medication safety was discussed. A good faith effort was made to reconcile the patient's medications.   Orders Placed This Encounter    HGA1C (HEMOGLOBIN A1C WITH EST AVG GLUCOSE)    CBC    COMPREHENSIVE METABOLIC PNL, FASTING    THYROID STIMULATING HORMONE (SENSITIVE TSH)    Refer to External Provider         Follow up:  Return in about 4 weeks (around 11/02/2020).    This note was  partially generated using MModal Fluency Direct system, and there may be some incorrect words, spellings, and punctuation that were not noted in checking the note before saving.    Leonie Man, MD  10/04/2020, 18:10

## 2020-10-05 ENCOUNTER — Ambulatory Visit (INDEPENDENT_AMBULATORY_CARE_PROVIDER_SITE_OTHER): Payer: Medicare Other

## 2020-10-05 ENCOUNTER — Encounter (INDEPENDENT_AMBULATORY_CARE_PROVIDER_SITE_OTHER): Payer: Self-pay | Admitting: FAMILY MEDICINE

## 2020-10-05 ENCOUNTER — Encounter (INDEPENDENT_AMBULATORY_CARE_PROVIDER_SITE_OTHER): Payer: Self-pay

## 2020-10-05 ENCOUNTER — Ambulatory Visit: Payer: Medicare Other | Attending: FAMILY MEDICINE | Admitting: FAMILY MEDICINE

## 2020-10-05 ENCOUNTER — Other Ambulatory Visit: Payer: Self-pay

## 2020-10-05 VITALS — BP 158/86 | HR 62 | Temp 96.4°F | Resp 20 | Ht 64.0 in | Wt 186.4 lb

## 2020-10-05 VITALS — BP 158/86 | HR 62 | Temp 96.4°F | Resp 18 | Ht 64.0 in | Wt 186.4 lb

## 2020-10-05 DIAGNOSIS — Z8659 Personal history of other mental and behavioral disorders: Secondary | ICD-10-CM

## 2020-10-05 DIAGNOSIS — Z1211 Encounter for screening for malignant neoplasm of colon: Secondary | ICD-10-CM

## 2020-10-05 DIAGNOSIS — I10 Essential (primary) hypertension: Secondary | ICD-10-CM

## 2020-10-05 DIAGNOSIS — E785 Hyperlipidemia, unspecified: Secondary | ICD-10-CM

## 2020-10-05 DIAGNOSIS — F419 Anxiety disorder, unspecified: Secondary | ICD-10-CM | POA: Insufficient documentation

## 2020-10-05 DIAGNOSIS — K5730 Diverticulosis of large intestine without perforation or abscess without bleeding: Secondary | ICD-10-CM | POA: Insufficient documentation

## 2020-10-05 DIAGNOSIS — E119 Type 2 diabetes mellitus without complications: Secondary | ICD-10-CM

## 2020-10-05 DIAGNOSIS — E039 Hypothyroidism, unspecified: Secondary | ICD-10-CM

## 2020-10-05 DIAGNOSIS — G2581 Restless legs syndrome: Secondary | ICD-10-CM

## 2020-10-05 DIAGNOSIS — R634 Abnormal weight loss: Secondary | ICD-10-CM

## 2020-10-05 DIAGNOSIS — Z0000 Encounter for general adult medical examination without abnormal findings: Secondary | ICD-10-CM | POA: Insufficient documentation

## 2020-10-05 DIAGNOSIS — S90416A Abrasion, unspecified lesser toe(s), initial encounter: Secondary | ICD-10-CM

## 2020-10-05 DIAGNOSIS — E782 Mixed hyperlipidemia: Secondary | ICD-10-CM

## 2020-10-05 HISTORY — DX: Encounter for general adult medical examination without abnormal findings: Z00.00

## 2020-10-05 LAB — COMPREHENSIVE METABOLIC PNL, FASTING
ALBUMIN: 4.4 g/dL (ref 3.4–4.8)
ALKALINE PHOSPHATASE: 65 U/L (ref 50–130)
ALT (SGPT): 25 U/L — ABNORMAL HIGH (ref 8–22)
ANION GAP: 11 mmol/L (ref 4–13)
AST (SGOT): 26 U/L (ref 8–45)
BILIRUBIN TOTAL: 1.6 mg/dL — ABNORMAL HIGH (ref 0.3–1.3)
BUN/CREA RATIO: 12 (ref 6–22)
BUN: 8 mg/dL (ref 8–25)
CALCIUM: 10.3 mg/dL — ABNORMAL HIGH (ref 8.8–10.2)
CHLORIDE: 109 mmol/L (ref 96–111)
CO2 TOTAL: 25 mmol/L (ref 23–31)
CREATININE: 0.68 mg/dL (ref 0.60–1.05)
ESTIMATED GFR: >90 mL/min/BSA (ref >=60)
GLUCOSE: 74 mg/dL (ref 70–99)
POTASSIUM: 3.9 mmol/L (ref 3.5–5.1)
PROTEIN TOTAL: 7.5 g/dL (ref 6.0–8.0)
SODIUM: 145 mmol/L (ref 136–145)

## 2020-10-05 LAB — HGA1C (HEMOGLOBIN A1C WITH EST AVG GLUCOSE)
ESTIMATED AVERAGE GLUCOSE: 126 mg/dL
HEMOGLOBIN A1C: 6 % (ref 4.0–6.0)

## 2020-10-05 LAB — THYROID STIMULATING HORMONE (SENSITIVE TSH): TSH: 1.075 u[IU]/mL (ref 0.430–3.550)

## 2020-10-05 LAB — CBC
HCT: 43.8 % (ref 34.8–46.0)
HGB: 14.2 g/dL (ref 11.5–16.0)
MCH: 31.3 pg (ref 26.0–32.0)
MCHC: 32.4 g/dL (ref 31.0–35.5)
MCV: 96.5 fL (ref 78.0–100.0)
PLATELETS: 181 10*3/uL (ref 150–400)
RBC: 4.54 10*6/uL (ref 3.85–5.22)
RDW-CV: 14.1 % (ref 11.5–15.5)
WBC: 6.1 10*3/uL (ref 3.7–11.0)

## 2020-10-05 NOTE — Nursing Note (Signed)
Patient here for recheck  Gwendolyn Havens, MA

## 2020-10-05 NOTE — Progress Notes (Signed)
FAMILY MEDICINE, [facility]  [address]  [place] [address]    Medicare Annual Wellness Visit    Name: Gwendolyn Crawford MRN:  [id number]   Date: 10/05/2020 Age: 65 y.o.     SUBJECTIVE:   Gwendolyn Crawford is a 65 y.o. female for presenting for Medicare Wellness exam.   I have reviewed and reconciled the medication list with the patient today.    Comprehensive Health Assessment:  Patient verbal responses recorded in flowsheet   Comprehensive Health Assessment-Adult 10/05/2020   Do you wish to complete this form? Yes   During the past 4 weeks, how would you rate your health in general? Fair   During the past 4 weeks, how much difficulty have you had doing your usual activities inside and outside your home because of medical or emotional problems? A little bit of difficulty   During the past 4 weeks, was someone available to help you if you needed and wanted help? Yes, as much as I wanted   In the past year, how many times have you gone to the emergency department or been admitted to a hospital for a health problem? None   During the past 4 weeks, how much bodily pain have you typically had? Mild pain   Do you currently use opioid pain medications, take regularly or keep on hand for use as needed (medication such as hydrocodone, oxycodone, codeine or morphine)? No   Are you generally satisfied with your sleep? No   Do you have enough money to buy things you need in everyday life, such as food, clothing, medicines, and housing? Yes, always   Can you get to places beyond walking distance without help?  (For example, can you drive your own car or travel alone on buses)? Yes   Do you fasten your seatbelt when you are in a car? Yes, usually   Do you exercise 20 minutes 3 or more days per week (such as walking, dancing, biking, mowing grass, swimming)? Yes, most of the time   How often do you eat food that is healthy (fruits, vegetables, lean meats) instead of unhealthy (sweets, fast food, junk food,  fatty foods)? Most of the time   How often do you have trouble taking medicines the eay you are told to take them? I always take them as prescribed   Do you need any help communicating with your doctors and nurses because of vision or hearing problems? No   During the past 12 months, have you experienced confusion or memory loss that is happening more often or is getting worse? Yes   Are you now also seeing any specialist physician(s) (such as eye doctor, foot doctor, skin doctor)? No   How confident are you that you can control or manage most of your health problems? Very confident       I have reviewed and updated as appropriate the past medical, family and social history. 10/05/2020 as summarized below:  Past Medical History:   Diagnosis Date   . Allergic rhinitis    . Anxiety    . Arthritis    . Depression    . Diabetes mellitus, type 2 (CMS HCC)    . Gout    . Headache    . Hypercholesterolemia    . Hypertension    . Hypothyroid    . Hypothyroidism    . Insomnia    . PONV (postoperative nausea and vomiting)    . RLS (restless legs syndrome)    .  Vitamin deficiency    . Wears glasses      Past Surgical History:   Procedure Laterality Date   . Colonoscopy     . Esophagogastroduodenoscopy     . Hx breast biopsy Left    . Hx cholecystectomy     . Hx dental extraction       Current Outpatient Medications   Medication Sig   . acetaminophen (TYLENOL) 325 mg Oral Tablet Take 325 mg by mouth Every 4 hours as needed for Pain   . allopurinoL (ZYLOPRIM) 100 mg Oral Tablet TAKE ONE TABLET BY MOUTH TWO TIMES A DAY   . ascorbic acid (VITAMIN C) 1,000 mg Oral Tablet Take 1,000 mg by mouth Once a day   . aspirin (ECOTRIN) 81 mg Oral Tablet, Delayed Release (E.C.) Take 81 mg by mouth Once a day Pt reports Taking once weekly   . atorvastatin (LIPITOR) 10 mg Oral Tablet TAKE ONE TABLET BY MOUTH EVERY EVENING FOR CHOLESTEROL   . cholecalciferol, vitamin D3, 25 mcg (1,000 unit) Oral Tablet Take 1,000 Units by mouth Once a day   .  cinnamon bark (CINNAMON ORAL) Take 1,000 mg by mouth bid   . cyanocobalamin (VITAMIN B 12) 1,000 mcg Oral Tablet Take 1,000 mcg by mouth Once a day   . gabapentin (NEURONTIN) 600 mg Oral Tablet Take 1 Tablet (600 mg total) by mouth Three times a day Indications: diabetic complication causing injury to some body nerves   . glimepiride (AMARYL) 1 mg Oral Tablet TAKE ONE TABLET BY MOUTH EVERY MORNING   . levothyroxine (SYNTHROID) 100 mcg Oral Tablet TAKE ONE TABLET BY MOUTH EVERY MORNING   . lisinopriL (PRINIVIL) 5 mg Oral Tablet TAKE ONE TABLET BY MOUTH EVERY DAY   . loratadine (CLARITIN) 10 mg Oral Tablet Take 10 mg by mouth Once a day   . meclizine (ANTIVERT) 25 mg Oral Tablet Take 25 mg by mouth Every 8 hours as needed    . MetFORMIN (GLUCOPHAGE) 1,000 mg Oral Tablet TAKE ONE TABLET BY MOUTH EVERY MORNING AND EVERY EVENING WITH FOOD FOR BLOOD SUGAR Indications: type 2 diabetes mellitus   . multivit with minerals/lutein (MULTIVITAMIN 50 PLUS ORAL) Take by mouth   . naproxen Sodium (ALEVE) 220 mg Oral Tablet Take 220 mg by mouth Every 12 hours as needed    . triamcinolone acetonide (ARISTOCORT) 0.5 % Cream      Family Medical History:     Problem Relation (Age of Onset)    Alzheimer's/Dementia Mother, Sister    COPD Sister    Kidney Disease Father    Stroke Mother, Sister          Social History     Socioeconomic History   . Marital status: Married   Tobacco Use   . Smoking status: Former Smoker     Years: 1.00     Types: Cigarettes   . Smokeless tobacco: Never Used   Vaping Use   . Vaping Use: Never used   Substance and Sexual Activity   . Alcohol use: Not Currently   . Drug use: Never   Other Topics Concern   . Ability to Walk 1 Flight of Steps without SOB/CP No   . Routine Exercise Yes   . Ability To Do Own ADL's Yes     Review of Systems: Pertinent items are noted in HPI.     List of Current Health Care Providers   Care Team     PCP  Name Type Specialty Phone Number    Leonie Man, MD Physician FAMILY  MEDICINE [phone]          Care Team     Name Type Specialty Phone Number    Norberta Keens,  Not available OPTOMETRY [phone]    Havery Moros Not available Not available Not available                  Health Maintenance   Topic Date Due   . Pneumococcal Vaccination, Age 75+ (2 - PCV) 03/10/2013   . Annual Wellness Visit  Never done   . Covid-19 Vaccine (2 - Pfizer series) 12/07/2019   . Influenza Vaccine (1) 09/22/2020   . Diabetes A1C  11/30/2020   . Depression Screening  10/05/2021   . Diabetic Retinal Exam  12/31/2021   . Mammography  01/03/2022   . Pap smear  05/19/2024   . Colonoscopy  10/02/2028   . Adult Tdap-Td (2 - Td or Tdap) 01/22/2029   . Osteoporosis screening  06/10/2030   . Hepatitis C screening  Completed   . HIV Screening  Completed   . Shingles Vaccine  Completed   . Meningococcal Vaccine  Aged Out     Medicare Wellness Assessment   Medicare initial or wellness physical in the last year?: No  Advance Directives (optional)   Does patient have a living will or MPOA: YES   Has patient provided [employer] with a copy?: no (Pt instructed to bring in copy)   Advance directive information given to the patient today?: no      Activities of Daily Living   Do you need help with dressing, bathing, or walking?: No   Do you need help with shopping, housekeeping, medications, or finances?: No   Do you have rugs in hallways, broken steps, or poor lighting?: No (Pt has throw rug in hallway)   Do you have grab bars in your bathroom, non-slip strips in your tub, and hand rails on your stairs?: Yes (No grab bars but feels she does not need at this time)   Urinary Incontinence Screen (Women >=65 only)   Do you ever leak urine when you don't want to?: YES   Cognitive Function Screen (1=Yes, 0=No)   What is you age?: Correct   What is the time to the nearest hour?: Correct   What is the year?: Correct   What is the name of this clinic?: Correct   Can the patient recognize two persons (the doctor,  the nurse, home help, etc.)?: Correct   What is the date of your birth? (day and month sufficient) : Correct   In what year did World War II end?: Incorrect   Who is the current president of the [place]?: Correct   Count from 20 down to 1?: Correct   What address did I give you earlier?: Correct   Total Score: 9   Interpretation of Total Score: Greater than 6 Normal   Hearing Screen   Have you noticed any hearing difficulties?: No  After whispering 9-1-6 how many numbers did the patient repeat correctly?: 3   Fall Risk Screen   Do you feel unsteady when standing or walking?: Yes  Do you worry about falling?: No  Have you fallen in the past year?: No  Timed up and go test (in seconds): 10   Vision Screen   Right Eye = 20: 25   Left Eye = 20: 20   Depression Screen  Little interest or pleasure in doing things.: More than half the days  Feeling down, depressed, or hopeless: More than half the days  PHQ 2 Total: 4  Trouble falling or staying asleep, or sleeping too much.: Nearly every day  Feeling tired or having little energy: Nearly every day  Poor appetite or overeating: Nearly every day (poor appetite)  Feeling bad about yourself/ that you are a failure in the past 2 weeks?: Not at all  Trouble concentrating on things in the past 2 weeks?: Not at all  Moving/Speaking slowly or being fidgety or restless  in the past 2 weeks?: More than half the days  Thoughts that you would be better off DEAD, or of hurting yourself in some way.: Not at all  PHQ 9 Total: 15  Interpretation of Total Score: 15-19 Moderate/Severe depression        OBJECTIVE:   BP (!) 158/86   Pulse 62   Temp 35.8 C (96.4 F)   Resp 20   Ht 1.626 m (5\' 4" )   Wt 84.6 kg (186 lb 6.4 oz)   SpO2 99%   BMI 32.00 kg/m        Other appropriate exam:    Health Maintenance Due   Topic Date Due   . Pneumococcal Vaccination, Age 62+ (2 - PCV) 03/10/2013   . Annual Wellness Visit  Never done   . Covid-19 Vaccine (2 - Pfizer series) 12/07/2019   .  Influenza Vaccine (1) 09/22/2020      ASSESSMENT & PLAN:    Identified Risk Factors/ Recommended Actions   (E78.2) Hyperlipidemia, mixed  (primary encounter diagnosis)  Plan: Follow up with PCP      (Z00.00) Encounter for initial annual wellness visit (AWV) in Medicare patient  Plan: Pt is due for her flu vaccine, pneumonia vaccine, covid booster. Pt to discuss with Dr. 11/22/2020 today concerning depression, sleep issues and memory loss. Pt is scheduled for her yearly mammogram on 01/05/21.     (I10) Primary hypertension  Plan: Follow up with PCP      (G25.81) RLS (restless legs syndrome)  Plan: Follow up with PCP      (K57.30) Diverticulosis of colon  Plan: Follow up with PCP      (E11.9) Type 2 diabetes mellitus (CMS HCC)  Plan: Follow up with PCP        (E03.9) Acquired hypothyroidism  Plan:Follow up with PCP      (F41.9) Anxiety  Plan: Follow up with PCP      (Z86.59) History of depression  Plan: Follow up with PCP        (Z12.11) Screening for colon cancer  Plan: Follow up with PCP         Fall Risk Follow up plan of care: Footwear and potential problems addressed  Depression screening is positive. Follow up plan of care: Follow up  with pcp  Urinary Incontinence Plan of Care: Behavioral interventions with bladder training, pelvic floor muscle training, and/or prompted voiding  No orders of the defined types were placed in this encounter.         The patient has been educated about risk factors and recommended preventive care. Written Prevention Plan completed/ updated and given to patient (see After Visit Summary).    No follow-ups on file.    02-09-1972, RN

## 2020-10-05 NOTE — Patient Instructions (Addendum)
Discuss with doctor about any necessary testing and vaccines FBX:UXYBF booster, flu vaccine, pneumonia vaccine. Discuss with Dr. Leonie Man today about your depression, sleep issues, memory issues, aspirin use and elevated blood pressure. Have your mammogram done 01/05/21. Remove throw rugs in hallways to prevent falls. Bring in copy of advance directive to scan in to your chart.  Practice Kegel exercises as discussed to help with urinary incontinence  Exercise as tolerated 3 days per week for 20 minutes a day. Eat more healthy foods (fruits, vegetables, lean meats, whole grains) instead of unhealthy foods (sweets, junk food, fast food, fatty foods). Have your regular eye and dental exams.                                                                                                                                                                                                   Medicare Preventive Services  Medicare coverage information Recommendation for YOU   Heart Disease and Diabetes   Lipid profile Every 5 years or more often if at risk for cardiovascular disease  Last Lipid Panel  (Last result in the past 2 years)      Cholesterol   HDL   LDL   Direct LDL   Triglycerides      01/19/19 1813 173   50   84     195           Diabetes Screening  yearly for those at risk for diabetes, 2 tests per year for those with prediabetes Last Glucose: 132    Diabetes Self Management Training or Medical Nutrition Therapy  For those with diabetes, up to 10 hrs initial training within a year, subsequent years up to 2 hrs of follow up training Optional for those with diabetes     Medical Nutrition Therapy Three hours of one-on-one counseling in first year, two hours in subsequent years Optional for those with diabetes, kidney disease   Intensive Behavioral Therapy for Obesity  Face-to-face counseling, first month every week, month 2-6 every other week, month 7-12 every month if continued progress is documented Optional  for those with Body Mass Index 30 or higher  Your Body mass index is 32 kg/m.   Tobacco Cessation (Quitting) Counseling   Two attempts per year, max 4 sessions per attempt, up to 8 per year, for those with tobacco-related health condition Optional for those that use tobacco   Cancer Screening   Colorectal screening   For anyone age 95 to 33 or any age if high risk:  . Screening Colonoscopy every 10 yrs if low risk,  more  frequent if higher risk  OR  . Cologuard Stool DNA test once every 3 years OR  . Fecal Occult Blood Testing yearly OR  . Flexible  Sigmoidoscopy  every 5 yr OR  . CT Colonography every 5 yrs    See your schedule below   Screening Pap Test Recommended every 3 years for all women age 68 to 4, or every five years if combined with HPV test (routine screening not needed after total hysterectomy).  Medicare covers every 2 years, up to yearly if high risk.  Screening Pelvic Exam Medicare covers every 2 years, yearly if high risk or childbearing age with abnormal Pap in last 3 yrs. See your schedule below   Screening Mammogram   Recommended every 2 years for women age 13 to 62, or more frequent if you have a higher risk. Selectively recommended for women between 40-49 based on shared decisions about risk. Covered by Medicare up to every year for women age 36 or older See your schedule below   Lung Cancer Screening  Annual low dose computed tomography (LDCT scan) is recommended for those age 45-77 who smoked 20 pack-years and are current smokers or quit smoking within past 15 years (one pack-year= smoking one PPD for one year), after counseling by your doctor or nurse clinician about the possible benefits or harms. See your schedule below   Vaccinations   Pneumococcal Vaccine: Recommended routinely age 58+ with one or two separate vaccines based on your risk    Recommended before age 71 if medical conditions with increased risk  Seasonal Influenza Vaccine: Once every flu season   Hepatitis B Vaccine: 3  doses if risk (including anyone with diabetes or liver disease)  Shingles Vaccine: Two doses at age 105 or older  Diphtheria Tetanus Pertussis Vaccine: ONCE as adult, booster every 10 years     Immunization History   Administered Date(s) Administered   . Covid-19 Vaccine,Pfizer-BioNTech,Purple Top,70yrs+ 03/28/2019, 04/16/2019, 11/16/2019, 05/23/2020   . Influenza Vaccine, 6 month-adult 01/03/2004, 12/06/2010, 10/22/2017, 10/21/2018   . Pneumovax 03/10/2012   . Shingrix - Zoster Vaccine (Admin) 02/26/2019, 06/16/2019   . Tetanus Toxoid/Diphtheria Toxoid/Acellular Pertussis Vaccine, Adsorbed (Tdap) 01/23/2019     Shingles vaccine and Diphtheria Tetanus Pertussis vaccines are available at pharmacies or local health department without a prescription.   Other Screening   Bone Densitometry   Every 24 months for anyone at risk, including postmenopausal       Glaucoma Screening   Yearly if in high risk group such as diabetes, family history, African American age 55+ or Hispanic American age 58+      Hepatitis C Screening recommended ONCE for those born between 1945-1965, or high risk for HCV infection       HIV Testing recommended routinely at least ONCE, covered every year for age 53 to 12 regardless of risk, and every year for age over 24 who ask for the test or higher risk  Yearly or up to 3 times in pregnancy         Abdominal Aortic Aneurysm Screening Ultrasound   Once between the age of 21-75 with a family history of AAA       Your Personalized Schedule for Preventive Tests   Health Maintenance: Pending and Last Completed       Date Due Completion Date    Pneumococcal Vaccination, Age 11+ (2 - PCV) 03/10/2013 03/10/2012    Covid-19 Vaccine (2 - Pfizer series) 12/07/2019 11/16/2019    Influenza Vaccine (1) 09/22/2020  11/30/2019    Diabetes A1C 11/30/2020 05/30/2020    Depression Screening 10/05/2021 10/05/2020    Annual Wellness Visit 10/05/2021 10/05/2020    Diabetic Retinal Exam 12/31/2021 01/01/2020    Mammography  01/03/2022 01/04/2020    Pap smear 05/19/2024 05/20/2019    Colonoscopy 10/02/2028 10/03/2018    Adult Tdap-Td (2 - Td or Tdap) 01/22/2029 01/23/2019    Osteoporosis screening 06/10/2030 06/09/2020

## 2020-10-05 NOTE — Addendum Note (Signed)
Addended by: Leonie Man on: 10/05/2020 11:06 PM     Modules accepted: Orders

## 2020-10-06 ENCOUNTER — Telehealth (INDEPENDENT_AMBULATORY_CARE_PROVIDER_SITE_OTHER): Payer: Self-pay | Admitting: FAMILY MEDICINE

## 2020-10-06 DIAGNOSIS — E785 Hyperlipidemia, unspecified: Secondary | ICD-10-CM

## 2020-10-06 DIAGNOSIS — R634 Abnormal weight loss: Secondary | ICD-10-CM

## 2020-10-06 DIAGNOSIS — R7989 Other specified abnormal findings of blood chemistry: Secondary | ICD-10-CM

## 2020-10-06 LAB — LIPID PANEL
CHOL/HDL RATIO: 2.7
CHOLESTEROL: 139 mg/dL (ref 100–200)
HDL CHOL: 51 mg/dL (ref >=50)
LDL CALC: 62 mg/dL (ref <100)
NON-HDL: 88 mg/dL (ref <=190)
TRIGLYCERIDES: 128 mg/dL (ref <150)
VLDL CALC: 26 mg/dL (ref <30)

## 2020-10-06 NOTE — Telephone Encounter (Signed)
I was able to reach the patient and speak with her. I reviewed with her all the results of her labs. Discussed with her getting an MRCP of her abdomen. She has per discussion no contraindication to have an mri. She has had an mri in the past. She also was concerned about her memory and states that both her sister and mother had Alzheimer's in their 67s. She will keep her apt with me in Oct for a MMSE and further discussion. She is to decrease her pm dose of Metformin from 1000mg  to 500mg  po daily. She is to continue to keep track of her BP and sugars.

## 2020-10-06 NOTE — Addendum Note (Signed)
Addended by: Wilburn Cornelia on: 10/06/2020 02:25 PM     Modules accepted: Orders

## 2020-10-06 NOTE — Telephone Encounter (Signed)
I called and left a message for the patient to call me on my cell phone regarding her labs. I called both the cell and home phone number.

## 2020-10-07 LAB — VITAMIN B12: VITAMIN B 12: 1641 pg/mL — ABNORMAL HIGH (ref 200–900)

## 2020-10-10 ENCOUNTER — Telehealth (INDEPENDENT_AMBULATORY_CARE_PROVIDER_SITE_OTHER): Payer: Self-pay | Admitting: FAMILY MEDICINE

## 2020-10-10 NOTE — Telephone Encounter (Signed)
I called and gave the patient the appt for the MRI Abdomen and it is scheduled for Sept 26th @ 10 am and to arrive 30 min to register and NPO after midnight the night before; no auth because Medicare is primary  [employer], Office Staff  10/10/2020, 10:02

## 2020-10-17 ENCOUNTER — Other Ambulatory Visit (INDEPENDENT_AMBULATORY_CARE_PROVIDER_SITE_OTHER): Payer: Self-pay | Admitting: FAMILY MEDICINE

## 2020-10-17 ENCOUNTER — Inpatient Hospital Stay
Admission: RE | Admit: 2020-10-17 | Discharge: 2020-10-17 | Disposition: A | Discharge status: Home / Self Care | Payer: Medicare Other | Source: Ambulatory Visit | Attending: FAMILY MEDICINE | Admitting: FAMILY MEDICINE

## 2020-10-17 ENCOUNTER — Other Ambulatory Visit: Payer: Self-pay

## 2020-10-17 DIAGNOSIS — R634 Abnormal weight loss: Secondary | ICD-10-CM | POA: Insufficient documentation

## 2020-10-17 DIAGNOSIS — R7989 Other specified abnormal findings of blood chemistry: Secondary | ICD-10-CM | POA: Insufficient documentation

## 2020-10-17 MED ORDER — GABAPENTIN 600 MG TABLET
600.0000 mg | ORAL_TABLET | Freq: Three times a day (TID) | ORAL | 0 refills | Status: DC
Start: 2020-10-17 — End: 2020-11-07

## 2020-10-17 NOTE — Telephone Encounter (Signed)
I talked with patient regarding Dr Graylin Shiver note    Patient said she is having some trouble with her toe right now and needs to work on getting this fixed first    She said she did see Dr Trilby Drummer and he did see her big toe    He told her to stop walking and get some comfy shoes for now  Patient said it is not better and may be some worse  She said it is red and does hurt  She said she would call Dr Trilby Drummer in the AM  Advised ER sooner if needed and she understood  Patient said she would talk with Dr Prince Rome regarding a GI referral at her Oct appt    Patient then said she needs her Neurontin refilled also, but no hurry    Refill / mess sent to Dr Prince Rome for review    Randolm Idol, RN

## 2020-10-17 NOTE — Result Encounter Note (Signed)
I have reviewed these results and no active changes need to be done at this time. Please keep your follow up appointment and continue your current medications. If any problems go to the ER.

## 2020-10-17 NOTE — Telephone Encounter (Signed)
Please let this patient know the results of her abdominal MRI. It was negative for any acute pathology. Check to see which direction she would like to see GI since she does still have slightly elevated 2 out of 4 liver tests.

## 2020-10-19 ENCOUNTER — Ambulatory Visit (HOSPITAL_COMMUNITY): Payer: Self-pay

## 2020-10-25 ENCOUNTER — Other Ambulatory Visit (INDEPENDENT_AMBULATORY_CARE_PROVIDER_SITE_OTHER): Payer: Self-pay | Admitting: FAMILY MEDICINE

## 2020-11-06 NOTE — Progress Notes (Signed)
Family Medicine, [facility]  [address]  [place] [address]  [phone]      Patient Name:  Gwendolyn Crawford  MRN:  [id number]  DOB:  1955-05-05    Date of Service:  11/07/2020    Chief Complaint:   Chief Complaint   Patient presents with   . Follow Up       Subjective:  BRYAUNA BYRUM is a 65 y.o. female who returns for a follow-up appointment.  She states that her sugars have been doing well but her blood pressure slowly increasing.  She has continued to watch her diet and has lost a few more lb.  She states that her husband and she has noticed that she has difficulty remembering names.  Her mother and sister had dementia.  Patient presents for of foot exam for obtaining diabetic shoes and insoles.  Patient states that she was seen by Podiatry and her toe he blister is improving.    Past Medical History:  Past Medical History:   Diagnosis Date   . Allergic rhinitis    . Anxiety    . Arthritis    . Depression    . Diabetes mellitus, type 2 (CMS HCC)    . Gout    . Headache    . Hypercholesterolemia    . Hypertension    . Hypothyroid    . Hypothyroidism    . Insomnia    . PONV (postoperative nausea and vomiting)    . RLS (restless legs syndrome)    . Vitamin deficiency    . Wears glasses             Past Surgical History:  Past Surgical History:   Procedure Laterality Date   . COLONOSCOPY     . ESOPHAGOGASTRODUODENOSCOPY     . HX BREAST BIOPSY Left     HX BENIGN BREAST BX UNSURE WHICH SIDE   . HX CHOLECYSTECTOMY     . HX DENTAL EXTRACTION                 Current Outpatient Medications   Medication Sig   . acetaminophen (TYLENOL) 325 mg Oral Tablet Take 325 mg by mouth Every 4 hours as needed for Pain   . allopurinoL (ZYLOPRIM) 100 mg Oral Tablet TAKE ONE TABLET BY MOUTH TWO TIMES A DAY   . ascorbic acid (VITAMIN C) 1,000 mg Oral Tablet Take 1,000 mg by mouth Once a day   . aspirin (ECOTRIN) 81 mg Oral Tablet, Delayed Release (E.C.) Take 81 mg by mouth Once a day Pt reports Taking once  weekly   . atorvastatin (LIPITOR) 10 mg Oral Tablet Take 1 Tablet (10 mg total) by mouth Once a day Indications: excessive fat in the blood   . cholecalciferol, vitamin D3, 25 mcg (1,000 unit) Oral Tablet Take 1,000 Units by mouth Once a day   . cinnamon bark (CINNAMON ORAL) Take 1,000 mg by mouth bid   . cyanocobalamin (VITAMIN B 12) 1,000 mcg Oral Tablet Take 1,000 mcg by mouth Once a day   . DULoxetine (CYMBALTA DR) 20 mg Oral Capsule, Delayed Release(E.C.) Take 1 Capsule (20 mg total) by mouth Once a day for 90 days Indications: anxiousness associated with depression, neuropathic pain   . [START ON 11/14/2020] gabapentin (NEURONTIN) 600 mg Oral Tablet Take 1 Tablet (600 mg total) by mouth Three times a day Indications: diabetic complication causing injury to some body nerves   . levothyroxine (SYNTHROID) 100 mcg  Oral Tablet TAKE ONE TABLET BY MOUTH EVERY MORNING   . lisinopriL (PRINIVIL) 5 mg Oral Tablet Take 1.5 Tablets (7.5 mg total) by mouth Once a day Indications: high blood pressure   . loratadine (CLARITIN) 10 mg Oral Tablet Take 10 mg by mouth Once a day   . meclizine (ANTIVERT) 25 mg Oral Tablet Take 25 mg by mouth Every 8 hours as needed    . MetFORMIN (GLUCOPHAGE) 1,000 mg Oral Tablet Take 0.5 Tablets (500 mg total) by mouth Twice daily with food Indications: type 2 diabetes mellitus   . multivit with minerals/lutein (MULTIVITAMIN 50 PLUS ORAL) Take by mouth   . naproxen Sodium (ALEVE) 220 mg Oral Tablet Take 220 mg by mouth Every 12 hours as needed    . triamcinolone acetonide (ARISTOCORT) 0.5 % Cream        Allergies   Allergen Reactions   . Zocor [Simvastatin] Myalgia   . Crestor [Rosuvastatin]  Other Adverse Reaction (Add comment) and Myalgia     unknown       Objective:    BP 128/70 (Site: Left, Patient Position: Sitting, Cuff Size: Adult)   Pulse 74   Temp 36.9 C (98.5 F) (Oral)   Resp 18   Ht 1.626 m (5\' 4" )   Wt 83.4 kg (183 lb 12.8 oz)   SpO2 99%   BMI 31.55 kg/m       Body mass  index is 31.55 kg/m.     Physical Exam:  Constitutional: she is oriented to person, place, and time and well-developed, well-nourished, and in no distress.   HENT:  TM and canals with some cerumen bilaterally.  No erythema, exudate or masses.  Head: Normocephalic.   Eyes: Pupils are equal, round, and reactive to light. Conjunctivae and EOM are normal. Right eye exhibits no discharge. Left eye exhibits no discharge.   Neck: Normal range of motion. Neck supple. No thyromegaly present.   Cardiovascular: Normal rate, regular rhythm, normal heart sounds and intact distal pulses. Exam reveals no friction rub.   No murmur heard.  Pulmonary/Chest: Breath sounds normal. No respiratory distress. she  has no wheezes. she  has no rales.   Musculoskeletal:         General: No tenderness or edema.   Lymphadenopathy:     she  has no cervical adenopathy.   Neurological she  is alert and oriented to person, place, and time.   Skin: Skin is warm and dry.   Psychiatric: Affect normal.    Diabetic foot exam:  No edema bilaterally. No ulcerations or lesions bilaterally. Pulses normal bilaterally.  Some callus formation.        Data reviewed:  HEMOGLOBIN A1C  Lab Results   Component Value Date    HA1C 6.0 10/05/2020     BASIC METABOLIC PANEL  Lab Results   Component Value Date    SODIUM 145 10/05/2020    POTASSIUM 3.9 10/05/2020    CHLORIDE 109 10/05/2020    CO2 25 10/05/2020    ANIONGAP 11 10/05/2020    BUN 8 10/05/2020    CREATININE 0.68 10/05/2020    BUNCRRATIO 12 10/05/2020    GFR >90 10/05/2020    CALCIUM 10.5 (H) 11/07/2020    GLUCOSENF 74 10/05/2020         Recent Results (from the past 10/07/2020 hour(s))   MAMMO BILATERAL SCREENING-ADDL VIEWS/BREAST 10932 AS REQ BY RAD    Collection Time: 01/04/20  9:49 AM    Narrative  Computer aided detection (CAD) technology has been applied to the standard   (CC and MLO) images.    2D images were acquired and reviewed.    FILMS COMPARED:  Compared to: 10/27/2018 MAMMO BILATERAL SCREENING-ADDL  VIEWS/BREAST US AS   REQ BY RAD and 10/24/2017 MAMMO BILAT SCREENING WO TOMO-ADDL VIEWS/BREAST   US AS REQ BY RAD    HISTORY:  Procedure: MAMMO BILATERAL SCREENING WO TOMO-ADDL VIEWS/BREAST US AS REQ   BY RAD  Reason for exam: Visit for screening mammogram      FINDINGS:  The breasts have scattered areas of fibroglandular density.    Bilateral  There is no evidence of suspicious masses, calcifications, or other   abnormal findings.      Impression    Follow up mammogram in 1 year is recommended for both breasts.    BI-RADS ATLAS category (overall): 1 - Negative        Social Determinants identified with potential adverse effects on health outcomes:  none           Assessment/Plan:  (E11.9) Type 2 diabetes mellitus (CMS HCC)  (primary encounter diagnosis)    (E78.2) Hyperlipidemia, mixed    (I10) Primary hypertension    (G25.81) RLS (restless legs syndrome)    (F41.9) Anxiety    (Z86.59) History of depression    (E83.52) Hypercalcemia  Plan: Calcium, VITAMIN D 25 TOTAL, PARATHYROID         HORMONE (PTH)    (R41.3) Memory loss  Plan: CT BRAIN WO IV CONTRAST         Plan:  Discussed with the patient her previous history of depression and being on Cymbalta.  She complains of a little bit more neuropathy in her feet.  Per this discussion and stating that she has been stressed she was placed back on Cymbalta 20 mg p.o. daily.  She has had no problems with this in the past.  She is to follow-up in 3 months and p.r.n.Marland Kitchen  She had MMSE done which was 30/30.        Treatment plan was discussed with patient and shared decision making employed. The patient was encouraged to call with any additional questions or concerns.     Discussed with patient effects and side effects of medications. Medication safety was discussed. A good faith effort was made to reconcile the patient's medications.   Orders Placed This Encounter   . CT BRAIN WO IV CONTRAST   . Calcium   . VITAMIN D 25 TOTAL   . PARATHYROID HORMONE (PTH)   . DULoxetine  (CYMBALTA DR) 20 mg Oral Capsule, Delayed Release(E.C.)   . lisinopriL (PRINIVIL) 5 mg Oral Tablet   . gabapentin (NEURONTIN) 600 mg Oral Tablet   . MetFORMIN (GLUCOPHAGE) 1,000 mg Oral Tablet   . atorvastatin (LIPITOR) 10 mg Oral Tablet         Follow up:  No follow-ups on file.    This note was partially generated using MModal Fluency Direct system, and there may be some incorrect words, spellings, and punctuation that were not noted in checking the note before saving.    Leonie Man, MD  11/06/2020, 19:04

## 2020-11-07 ENCOUNTER — Other Ambulatory Visit: Payer: Self-pay

## 2020-11-07 ENCOUNTER — Encounter (INDEPENDENT_AMBULATORY_CARE_PROVIDER_SITE_OTHER): Payer: Self-pay | Admitting: FAMILY MEDICINE

## 2020-11-07 ENCOUNTER — Ambulatory Visit: Payer: Medicare Other | Attending: FAMILY MEDICINE | Admitting: FAMILY MEDICINE

## 2020-11-07 VITALS — BP 128/70 | HR 74 | Temp 98.5°F | Resp 18 | Ht 64.0 in | Wt 183.8 lb

## 2020-11-07 DIAGNOSIS — I10 Essential (primary) hypertension: Secondary | ICD-10-CM | POA: Insufficient documentation

## 2020-11-07 DIAGNOSIS — F419 Anxiety disorder, unspecified: Secondary | ICD-10-CM | POA: Insufficient documentation

## 2020-11-07 DIAGNOSIS — R413 Other amnesia: Secondary | ICD-10-CM | POA: Insufficient documentation

## 2020-11-07 DIAGNOSIS — E782 Mixed hyperlipidemia: Secondary | ICD-10-CM | POA: Insufficient documentation

## 2020-11-07 DIAGNOSIS — Z8659 Personal history of other mental and behavioral disorders: Secondary | ICD-10-CM | POA: Insufficient documentation

## 2020-11-07 DIAGNOSIS — E119 Type 2 diabetes mellitus without complications: Secondary | ICD-10-CM | POA: Insufficient documentation

## 2020-11-07 DIAGNOSIS — G2581 Restless legs syndrome: Secondary | ICD-10-CM | POA: Insufficient documentation

## 2020-11-07 LAB — PARATHYROID HORMONE (PTH): PTH: 52 pg/mL (ref 8.5–77.0)

## 2020-11-07 LAB — CALCIUM: CALCIUM: 10.5 mg/dL — ABNORMAL HIGH (ref 8.8–10.2)

## 2020-11-07 LAB — VITAMIN D 25 TOTAL: VITAMIN D 25, TOTAL: 49.1 ng/mL (ref 30.0–100.0)

## 2020-11-07 MED ORDER — LISINOPRIL 5 MG TABLET
7.5000 mg | ORAL_TABLET | Freq: Every day | ORAL | 1 refills | Status: DC
Start: 2020-11-07 — End: 2021-02-06

## 2020-11-07 MED ORDER — GABAPENTIN 600 MG TABLET
600.0000 mg | ORAL_TABLET | Freq: Three times a day (TID) | ORAL | 0 refills | Status: DC
Start: 2020-11-14 — End: 2020-12-19

## 2020-11-07 MED ORDER — ATORVASTATIN 10 MG TABLET
ORAL_TABLET | ORAL | 1 refills | Status: DC
Start: 2020-11-07 — End: 2020-11-07

## 2020-11-07 MED ORDER — DULOXETINE 20 MG CAPSULE,DELAYED RELEASE
20.0000 mg | DELAYED_RELEASE_CAPSULE | Freq: Every day | ORAL | 1 refills | Status: DC
Start: 2020-11-07 — End: 2021-05-02

## 2020-11-07 MED ORDER — METFORMIN 1,000 MG TABLET
500.0000 mg | ORAL_TABLET | Freq: Two times a day (BID) | ORAL | 1 refills | Status: DC
Start: 2020-11-07 — End: 2021-02-20

## 2020-11-07 MED ORDER — LISINOPRIL 5 MG TABLET
7.5000 mg | ORAL_TABLET | Freq: Every day | ORAL | 1 refills | Status: DC
Start: 2020-11-07 — End: 2020-11-07

## 2020-11-07 MED ORDER — ATORVASTATIN 10 MG TABLET
10.0000 mg | ORAL_TABLET | Freq: Every day | ORAL | 1 refills | Status: DC
Start: 2020-11-07 — End: 2021-02-20

## 2020-11-07 NOTE — Nursing Note (Signed)
Patient here for recheck  Gwendolyn Loh, MA

## 2020-11-08 ENCOUNTER — Telehealth (INDEPENDENT_AMBULATORY_CARE_PROVIDER_SITE_OTHER): Payer: Self-pay | Admitting: FAMILY MEDICINE

## 2020-11-08 NOTE — Telephone Encounter (Signed)
Called patient and gave her the appt for the CT Brain wo and it is scheduled for Oct 24th @ 3:30 and to arrive 30 min early to register; no auth required because Medicare is primary  [employer], Office Staff  11/08/2020, 15:21

## 2020-11-10 ENCOUNTER — Telehealth (INDEPENDENT_AMBULATORY_CARE_PROVIDER_SITE_OTHER): Payer: Self-pay | Admitting: FAMILY MEDICINE

## 2020-11-10 NOTE — Telephone Encounter (Signed)
Please let this patient know the results of her vitamin-D, PTH intact and calcium.  Check to see if the patient is taking Tums, foods rich in calcium or multivitamin with calcium since her calcium was still slightly elevated.

## 2020-11-10 NOTE — Telephone Encounter (Signed)
Patient notified ans understands  Gwendolyn Crawford, [place]

## 2020-11-14 ENCOUNTER — Other Ambulatory Visit: Payer: Self-pay

## 2020-11-14 ENCOUNTER — Inpatient Hospital Stay
Admission: RE | Admit: 2020-11-14 | Discharge: 2020-11-14 | Disposition: A | Discharge status: Home / Self Care | Payer: Medicare Other | Source: Ambulatory Visit | Attending: FAMILY MEDICINE | Admitting: FAMILY MEDICINE

## 2020-11-14 DIAGNOSIS — R413 Other amnesia: Secondary | ICD-10-CM

## 2020-11-14 NOTE — Result Encounter Note (Signed)
I have reviewed your labs. Keep your usual follow up apt. If you have any problems at all please go to the ER and reschedule your apt to an earlier time.

## 2020-12-16 ENCOUNTER — Other Ambulatory Visit (INDEPENDENT_AMBULATORY_CARE_PROVIDER_SITE_OTHER): Payer: Self-pay | Admitting: FAMILY MEDICINE

## 2020-12-19 ENCOUNTER — Other Ambulatory Visit (INDEPENDENT_AMBULATORY_CARE_PROVIDER_SITE_OTHER): Payer: Self-pay | Admitting: FAMILY MEDICINE

## 2020-12-19 NOTE — Telephone Encounter (Signed)
Duplicate

## 2021-01-05 ENCOUNTER — Inpatient Hospital Stay
Admission: RE | Admit: 2021-01-05 | Discharge: 2021-01-05 | Disposition: A | Discharge status: Home / Self Care | Payer: Medicare Other | Source: Ambulatory Visit | Attending: FAMILY MEDICINE | Admitting: FAMILY MEDICINE

## 2021-01-05 ENCOUNTER — Encounter (HOSPITAL_COMMUNITY): Payer: Self-pay

## 2021-01-05 ENCOUNTER — Other Ambulatory Visit: Payer: Self-pay

## 2021-01-05 DIAGNOSIS — Z1231 Encounter for screening mammogram for malignant neoplasm of breast: Secondary | ICD-10-CM

## 2021-01-05 NOTE — Result Encounter Note (Signed)
I have reviewed these results and no active changes need to be done at this time. Please keep your follow up appointment and continue your current medications. If any problems go to the ER.

## 2021-01-06 ENCOUNTER — Other Ambulatory Visit (INDEPENDENT_AMBULATORY_CARE_PROVIDER_SITE_OTHER): Payer: Self-pay | Admitting: FAMILY MEDICINE

## 2021-01-06 DIAGNOSIS — Z1231 Encounter for screening mammogram for malignant neoplasm of breast: Secondary | ICD-10-CM

## 2021-01-16 ENCOUNTER — Other Ambulatory Visit (INDEPENDENT_AMBULATORY_CARE_PROVIDER_SITE_OTHER): Payer: Self-pay | Admitting: FAMILY MEDICINE

## 2021-01-17 ENCOUNTER — Other Ambulatory Visit (INDEPENDENT_AMBULATORY_CARE_PROVIDER_SITE_OTHER): Payer: Self-pay | Admitting: FAMILY MEDICINE

## 2021-01-17 DIAGNOSIS — E1142 Type 2 diabetes mellitus with diabetic polyneuropathy: Secondary | ICD-10-CM

## 2021-01-17 NOTE — Telephone Encounter (Signed)
Duplicate

## 2021-01-17 NOTE — Telephone Encounter (Signed)
Dr. Greta Doom: pt called and left vm asking for this refill. To see you next 02/08/21. Pain agreement on file and up to date, but no uds on file per what I can see. Please fill as you see fit. Thanks. Titus Mould, RN

## 2021-01-25 ENCOUNTER — Other Ambulatory Visit (INDEPENDENT_AMBULATORY_CARE_PROVIDER_SITE_OTHER): Payer: Self-pay | Admitting: Family Medicine

## 2021-01-25 DIAGNOSIS — E790 Hyperuricemia without signs of inflammatory arthritis and tophaceous disease: Secondary | ICD-10-CM

## 2021-02-06 ENCOUNTER — Other Ambulatory Visit (INDEPENDENT_AMBULATORY_CARE_PROVIDER_SITE_OTHER): Payer: Self-pay | Admitting: FAMILY MEDICINE

## 2021-02-08 ENCOUNTER — Encounter (INDEPENDENT_AMBULATORY_CARE_PROVIDER_SITE_OTHER): Payer: Self-pay | Admitting: FAMILY MEDICINE

## 2021-02-08 ENCOUNTER — Telehealth (INDEPENDENT_AMBULATORY_CARE_PROVIDER_SITE_OTHER): Payer: Self-pay | Admitting: FAMILY MEDICINE

## 2021-02-13 ENCOUNTER — Other Ambulatory Visit (INDEPENDENT_AMBULATORY_CARE_PROVIDER_SITE_OTHER): Payer: Self-pay | Admitting: FAMILY MEDICINE

## 2021-02-19 NOTE — Progress Notes (Signed)
Family Medicine, [facility]  [address]  [place] [address]  [phone]      Patient Name:  Gwendolyn Crawford  MRN:  [id number]  DOB:  Mar 11, 1955    Date of Service:  02/20/2021    Chief Complaint:   Chief Complaint   Patient presents with    Check Up       Subjective:  MERITA HAWKS is a 66 y.o. female who returns for follow-up appointment.  She states that her sugars have been doing well but her blood pressures have slowly been drifting higher.  She has been taking her meds as prescribed    Past Medical History:  Past Medical History:   Diagnosis Date    Allergic rhinitis     Anxiety     Arthritis     Depression     Diabetes mellitus, type 2 (CMS HCC)     Gout     Headache     Hypercholesterolemia     Hypertension     Hypothyroid     Hypothyroidism     Insomnia     PONV (postoperative nausea and vomiting)     RLS (restless legs syndrome)     Vitamin deficiency     Wears glasses             Past Surgical History:  Past Surgical History:   Procedure Laterality Date    COLONOSCOPY      ESOPHAGOGASTRODUODENOSCOPY      HX BREAST BIOPSY Left     HX BENIGN BREAST BX UNSURE WHICH SIDE    HX CHOLECYSTECTOMY      HX DENTAL EXTRACTION                   Current Outpatient Medications   Medication Sig    acetaminophen (TYLENOL) 325 mg Oral Tablet Take 1 Tablet (325 mg total) by mouth Every 4 hours as needed for Pain    allopurinoL (ZYLOPRIM) 100 mg Oral Tablet TAKE ONE TABLET BY MOUTH TWO TIMES A DAY    ascorbic acid (VITAMIN C) 1,000 mg Oral Tablet Take 1 Tablet (1,000 mg total) by mouth Once a day    aspirin (ECOTRIN) 81 mg Oral Tablet, Delayed Release (E.C.) Take 1 Tablet (81 mg total) by mouth Once a day Pt reports Taking once weekly    atorvastatin (LIPITOR) 10 mg Oral Tablet TAKE ONE TABLET BY MOUTH EVERY DAY    cholecalciferol, vitamin D3, 25 mcg (1,000 unit) Oral Tablet Take 1 Tablet (1,000 Units total) by mouth Once a day    cinnamon bark (CINNAMON ORAL) Take  1,000 mg by mouth bid    cyanocobalamin (VITAMIN B 12) 1,000 mcg Oral Tablet Take 1 Tablet (1,000 mcg total) by mouth Once a day    DULoxetine (CYMBALTA DR) 20 mg Oral Capsule, Delayed Release(E.C.) Take 1 Capsule (20 mg total) by mouth Once a day for 90 days Indications: anxiousness associated with depression, neuropathic pain    gabapentin (NEURONTIN) 600 mg Oral Tablet TAKE ONE TABLET BY MOUTH THREE TIMES A DAY    glimepiride (AMARYL) 1 mg Oral Tablet Take 1 Tablet (1 mg total) by mouth Every morning with breakfast    levothyroxine (SYNTHROID) 100 mcg Oral Tablet TAKE ONE TABLET BY MOUTH EVERY MORNING    lisinopriL (PRINIVIL) 10 mg Oral Tablet Take 1 Tablet (10 mg total) by mouth Once a day for 90 days Indications: high blood pressure    loratadine (CLARITIN) 10 mg  Oral Tablet Take 1 Tablet (10 mg total) by mouth Once a day    meclizine (ANTIVERT) 25 mg Oral Tablet Take 1 Tablet (25 mg total) by mouth Every 8 hours as needed    MetFORMIN (GLUCOPHAGE) 500 mg Oral Tablet Take 1 Tablet (500 mg total) by mouth Twice daily with food for 90 days Indications: type 2 diabetes mellitus    naproxen Sodium (ALEVE) 220 mg Oral Tablet Take 1 Tablet (220 mg total) by mouth Every 12 hours as needed    triamcinolone acetonide (ARISTOCORT) 0.5 % Cream        Allergies   Allergen Reactions    Zocor [Simvastatin] Myalgia    Crestor [Rosuvastatin]  Other Adverse Reaction (Add comment) and Myalgia     unknown       Objective:    BP 124/78 (Site: Left, Patient Position: Sitting, Cuff Size: Adult Large)    Pulse 91    Temp 36.4 C (97.5 F)    Resp 20    Ht 1.626 m (5\' 4" )    Wt 83.9 kg (185 lb)    SpO2 99%    BMI 31.76 kg/m       Body mass index is 31.76 kg/m.     Physical Exam:  Constitutional: she is oriented to person, place, and time and obese and in no distress.   HENT:  Team and canals with some cerumen bilaterally.  No erythema, exudate or masses.  Head: Normocephalic.   Eyes: Pupils are equal, round, and  reactive to light. Conjunctivae and EOM are normal. Right eye exhibits no discharge. Left eye exhibits no discharge.   Neck: Normal range of motion. Neck supple. No thyromegaly present.   Cardiovascular: Normal rate, regular rhythm, normal heart sounds and intact distal pulses. Exam reveals no friction rub.   No murmur heard.  Pulmonary/Chest: Breath sounds normal. No respiratory distress. she  has no wheezes. she  has no rales.   Musculoskeletal:         General: No tenderness or edema.   Lymphadenopathy:     she  has no cervical adenopathy.   Neurological she  is alert and oriented to person, place, and time.   Skin: Skin is warm and dry.   Psychiatric: Affect normal.    Diabetic foot exam:  No edema bilaterally. No ulcerations or lesions bilaterally. Pulses normal bilaterally.        Data reviewed:  HEMOGLOBIN A1C  Lab Results   Component Value Date    HA1C 6.0 10/05/2020     BASIC METABOLIC PANEL  Lab Results   Component Value Date    SODIUM 145 10/05/2020    POTASSIUM 3.9 10/05/2020    CHLORIDE 109 10/05/2020    CO2 25 10/05/2020    ANIONGAP 11 10/05/2020    BUN 8 10/05/2020    CREATININE 0.68 10/05/2020    BUNCRRATIO 12 10/05/2020    GFR >90 10/05/2020    CALCIUM 10.5 (H) 02/20/2021    GLUCOSENF 74 10/05/2020         Recent Results (from the past [id number] hour(s))   MAMMO BILATERAL SCREENING-ADDL VIEWS/BREAST US AS REQ BY RAD    Collection Time: 01/05/21 11:13 AM    Narrative    Computer aided detection (CAD) technology has been applied to the standard   (CC and MLO) images.    3D tomosynthesis and 2D images were acquired and reviewed    FILMS COMPARED:  Compared to: 01/04/2020 MAMMO BILATERAL SCREENING-ADDL VIEWS/BREAST US AS  REQ BY RAD, 10/27/2018 MAMMO BILATERAL SCREENING-ADDL VIEWS/BREAST US AS   REQ BY RAD, and 10/24/2017 MAMMO BILAT SCREENING WO TOMO-ADDL VIEWS/BREAST   US AS REQ BY RAD    HISTORY:  Procedure: MAMMO BILATERAL SCREENING W TOMO-ADDL VIEWS/BREAST US AS REQ BY   RAD  Reason for exam: Breast  cancer screening by mammogram      FINDINGS:  The breasts are almost entirely fatty.    Bilateral  There is no evidence of suspicious masses, calcifications, or other   abnormal findings.      Impression    Follow up mammogram in 1 year is recommended for both breasts.    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  Her lisinopril dose which she has been taking 7.5 mg was increased to 10 mg p.o. daily.  She is to continue to keep track of blood pressure and pulse from the me know.  She was given additional log sheets for her to keep track at home.  She is to follow-up in 3 months but earlier if she has any       Treatment plan was discussed with patient and shared decision making employed. The patient was encouraged to call with any additional questions or concerns.     Discussed with patient effects and side effects of medications. Medication safety was discussed. A good faith effort was made to reconcile the patient's medications.   Orders Placed This Encounter    Calcium    Urine Microalbumin/Creatinine Ratio, Random    lisinopriL (PRINIVIL) 10 mg Oral Tablet    MetFORMIN (GLUCOPHAGE) 500 mg Oral Tablet         Follow up:  No follow-ups on file.    This note was partially generated using MModal Fluency Direct system, and there may be some incorrect words,  spellings, and punctuation that were not noted in checking the note before saving.    Leonie Man, MD  02/19/2021, 21:14

## 2021-02-20 ENCOUNTER — Ambulatory Visit: Payer: Medicare Other | Attending: FAMILY MEDICINE | Admitting: FAMILY MEDICINE

## 2021-02-20 ENCOUNTER — Encounter (INDEPENDENT_AMBULATORY_CARE_PROVIDER_SITE_OTHER): Payer: Self-pay | Admitting: FAMILY MEDICINE

## 2021-02-20 ENCOUNTER — Other Ambulatory Visit (INDEPENDENT_AMBULATORY_CARE_PROVIDER_SITE_OTHER): Payer: Self-pay | Admitting: FAMILY MEDICINE

## 2021-02-20 ENCOUNTER — Other Ambulatory Visit: Payer: Self-pay

## 2021-02-20 VITALS — BP 124/78 | HR 91 | Temp 97.5°F | Resp 20 | Ht 64.0 in | Wt 185.0 lb

## 2021-02-20 DIAGNOSIS — I10 Essential (primary) hypertension: Secondary | ICD-10-CM | POA: Insufficient documentation

## 2021-02-20 DIAGNOSIS — E039 Hypothyroidism, unspecified: Secondary | ICD-10-CM | POA: Insufficient documentation

## 2021-02-20 DIAGNOSIS — E782 Mixed hyperlipidemia: Secondary | ICD-10-CM | POA: Insufficient documentation

## 2021-02-20 DIAGNOSIS — E119 Type 2 diabetes mellitus without complications: Secondary | ICD-10-CM | POA: Insufficient documentation

## 2021-02-20 DIAGNOSIS — F325 Major depressive disorder, single episode, in full remission: Secondary | ICD-10-CM | POA: Insufficient documentation

## 2021-02-20 DIAGNOSIS — F419 Anxiety disorder, unspecified: Secondary | ICD-10-CM | POA: Insufficient documentation

## 2021-02-20 DIAGNOSIS — E790 Hyperuricemia without signs of inflammatory arthritis and tophaceous disease: Secondary | ICD-10-CM | POA: Insufficient documentation

## 2021-02-20 DIAGNOSIS — E1142 Type 2 diabetes mellitus with diabetic polyneuropathy: Secondary | ICD-10-CM | POA: Insufficient documentation

## 2021-02-20 LAB — CALCIUM: CALCIUM: 10.5 mg/dL — ABNORMAL HIGH (ref 8.8–10.2)

## 2021-02-20 LAB — URIC ACID: URIC ACID: 3.8 mg/dL (ref 2.9–6.3)

## 2021-02-20 MED ORDER — LISINOPRIL 10 MG TABLET
10.0000 mg | ORAL_TABLET | Freq: Every day | ORAL | 1 refills | Status: DC
Start: 2021-02-20 — End: 2021-07-14

## 2021-02-20 MED ORDER — METFORMIN 500 MG TABLET
500.0000 mg | ORAL_TABLET | Freq: Two times a day (BID) | ORAL | 1 refills | Status: DC
Start: 2021-02-20 — End: 2021-06-01

## 2021-02-20 NOTE — Nursing Note (Signed)
Pt here for checkup. She reports her BP has been high at home 140-150 systolic. Her blood sugar has been running high at times at home 120-130.

## 2021-03-01 ENCOUNTER — Telehealth (INDEPENDENT_AMBULATORY_CARE_PROVIDER_SITE_OTHER): Payer: Self-pay | Admitting: FAMILY MEDICINE

## 2021-03-01 NOTE — Telephone Encounter (Signed)
Pt notified and verbalized understanding. She states she has stopped taking her One A Day vitamin.  Pt wanted to know if she needed to continue taking vitamin D? Last checked on 11/07/20, vitamin D 49.1. per Dr. Prince Rome pt notified to continue taking her vitamin d.

## 2021-03-01 NOTE — Telephone Encounter (Signed)
Please check to see if the patient is taking any Calcium supplements, Tums or vit A? Her calcium was still slightly elevated at 10.5. Have her stop by in 2-4 weeks and recheck her labs fasting at 8am. If her w/u is negative and it is still elevated will have her see endocrine.

## 2021-03-02 ENCOUNTER — Other Ambulatory Visit (INDEPENDENT_AMBULATORY_CARE_PROVIDER_SITE_OTHER): Payer: Self-pay | Admitting: FAMILY MEDICINE

## 2021-03-16 ENCOUNTER — Other Ambulatory Visit (INDEPENDENT_AMBULATORY_CARE_PROVIDER_SITE_OTHER): Payer: Self-pay | Admitting: FAMILY MEDICINE

## 2021-03-16 MED ORDER — LEVOTHYROXINE 100 MCG TABLET
100.0000 ug | ORAL_TABLET | Freq: Every morning | ORAL | 1 refills | Status: DC
Start: 2021-03-16 — End: 2021-09-14

## 2021-03-16 MED ORDER — GABAPENTIN 600 MG TABLET
600.0000 mg | ORAL_TABLET | Freq: Three times a day (TID) | ORAL | 0 refills | Status: DC
Start: 2021-03-16 — End: 2021-04-17

## 2021-03-28 ENCOUNTER — Other Ambulatory Visit: Payer: Self-pay

## 2021-03-28 ENCOUNTER — Telehealth (INDEPENDENT_AMBULATORY_CARE_PROVIDER_SITE_OTHER): Payer: Self-pay | Admitting: FAMILY MEDICINE

## 2021-03-28 ENCOUNTER — Ambulatory Visit: Payer: Medicare Other | Attending: FAMILY MEDICINE

## 2021-03-28 DIAGNOSIS — R7402 Elevation of levels of lactic acid dehydrogenase (LDH): Secondary | ICD-10-CM

## 2021-03-28 LAB — CORTISOL, PLASMA OR SERUM: CORTISOL: 13.7 ug/dL (ref 7.0–25.0)

## 2021-03-28 LAB — MAGNESIUM: MAGNESIUM: 1.8 mg/dL (ref 1.8–2.6)

## 2021-03-28 LAB — ALBUMIN FOR ELECTROPHORESIS: ALBUMIN: 4.3 g/dL (ref 3.4–4.8)

## 2021-03-28 LAB — CALCIUM: CALCIUM: 10.1 mg/dL (ref 8.8–10.2)

## 2021-03-28 LAB — LDH: LDH: 396 U/L — ABNORMAL HIGH (ref 125–220)

## 2021-03-28 LAB — PROTEIN FOR ELECTROPHORESIS: PROTEIN TOTAL: 6.7 g/dL (ref 5.6–7.6)

## 2021-03-28 LAB — PHOSPHORUS: PHOSPHORUS: 3.8 mg/dL (ref 2.3–4.0)

## 2021-03-28 NOTE — Telephone Encounter (Signed)
Please let this patient know the results of her labs. Have her repeat her LDH in 2 weeks. If still elevated will get isoenzymes. Her calcium was normal. Other labs are still pending.

## 2021-03-28 NOTE — Nursing Note (Signed)
Pt labs drawn. Avriana Joo, MA

## 2021-03-29 ENCOUNTER — Ambulatory Visit (INDEPENDENT_AMBULATORY_CARE_PROVIDER_SITE_OTHER): Payer: Self-pay | Admitting: FAMILY MEDICINE

## 2021-03-29 LAB — IMMUNOGLOBULIN PROFILE (IGA, IGG, AND IGM), SERUM
IMMUNOGLOBULIN A (IGA): 162 mg/dL (ref 85–499)
IMMUNOGLOBULIN G (IGG): 689 mg/dL (ref 610–1616)
IMMUNOGLOBULIN M (IGM): 133 mg/dL (ref 35–242)

## 2021-03-29 LAB — PROTEIN ELECTROPHORESIS, SERUM (SPEP)
ALBUMIN: 4.3 g/dL (ref 3.4–4.8)
PROTEIN TOTAL: 6.7 g/dL (ref 5.6–7.6)

## 2021-03-29 NOTE — Telephone Encounter (Signed)
Called and left message for pt to return office call.

## 2021-03-29 NOTE — Telephone Encounter (Signed)
Returning call to discuss labs from a previous encounter.

## 2021-03-29 NOTE — Telephone Encounter (Signed)
Pt returned office call and information given.

## 2021-04-11 ENCOUNTER — Other Ambulatory Visit: Payer: Self-pay

## 2021-04-11 ENCOUNTER — Ambulatory Visit: Payer: Medicare Other | Attending: FAMILY MEDICINE

## 2021-04-11 ENCOUNTER — Telehealth (INDEPENDENT_AMBULATORY_CARE_PROVIDER_SITE_OTHER): Payer: Self-pay | Admitting: FAMILY MEDICINE

## 2021-04-11 DIAGNOSIS — R7402 Elevation of levels of lactic acid dehydrogenase (LDH): Secondary | ICD-10-CM | POA: Insufficient documentation

## 2021-04-11 LAB — LDH: LDH: 154 U/L (ref 125–220)

## 2021-04-11 NOTE — Result Encounter Note (Signed)
I have reviewed these results and no active changes need to be done at this time. Please keep your follow up appointment and continue your current medications. If any problems go to the ER.

## 2021-04-11 NOTE — Telephone Encounter (Signed)
Please let this patient know that if she has a ear ache she needs to be seen. Can work her in to see me 04/12/21. Remind her to be seen elsewhere if unable to come in eg.urgent care or ER.

## 2021-04-11 NOTE — Telephone Encounter (Signed)
Patient was in for labs today and explains that she has a Right ear ache and sore throat.  She states that you are aware that when she does this she goes into bronchitis quickly.  She has requested an antibiotic.  She doesn't want this to get bad  Gwendolyn Sandy, MA

## 2021-04-11 NOTE — Nursing Note (Signed)
LABS DRAWN

## 2021-04-12 NOTE — Telephone Encounter (Signed)
Called patient and she is in [place] today.  She states that she cannot get in today, but, if this continues she will call Monday and see if she can be seen  Thamas Jaegers, MA

## 2021-04-12 NOTE — Telephone Encounter (Signed)
Tried calling patient at home and no answer.  Tried calling cell and was not excepting any calls. Will call later.  Waynetta Sandy, MA

## 2021-04-17 ENCOUNTER — Other Ambulatory Visit (INDEPENDENT_AMBULATORY_CARE_PROVIDER_SITE_OTHER): Payer: Self-pay | Admitting: FAMILY MEDICINE

## 2021-05-02 ENCOUNTER — Other Ambulatory Visit (INDEPENDENT_AMBULATORY_CARE_PROVIDER_SITE_OTHER): Payer: Self-pay | Admitting: FAMILY MEDICINE

## 2021-05-15 ENCOUNTER — Other Ambulatory Visit (INDEPENDENT_AMBULATORY_CARE_PROVIDER_SITE_OTHER): Payer: Self-pay | Admitting: FAMILY MEDICINE

## 2021-05-15 MED ORDER — GABAPENTIN 600 MG TABLET
600.0000 mg | ORAL_TABLET | Freq: Three times a day (TID) | ORAL | 0 refills | Status: DC
Start: 2021-05-15 — End: 2021-06-12

## 2021-05-23 ENCOUNTER — Other Ambulatory Visit (INDEPENDENT_AMBULATORY_CARE_PROVIDER_SITE_OTHER): Payer: Self-pay | Admitting: FAMILY MEDICINE

## 2021-05-23 MED ORDER — ATORVASTATIN 10 MG TABLET
10.0000 mg | ORAL_TABLET | Freq: Every day | ORAL | 1 refills | Status: DC
Start: 2021-05-23 — End: 2021-10-30

## 2021-05-26 IMAGING — CT CT ABDOMEN PELVIS WITHOUT CONTRAST
2 of 3 series · 17 of 46 positions shown, 19 images · non-contrast
Comparison: None available.

FINAL REPORT:
INDICATION: Abdominal pain, acute, nonlocalized
EXAM: CT of the abdomen and pelvis without IV Contrast
All CT scans at this facility use iterative reconstruction technique, dose modulation and/or weight based dosing when appropriate to reduce radiation dose to as low as reasonably achievable.

[Series 2: renal stone · axial · 0.77mm/px · z∈[-364,+18]mm · 14 of 177 slices shown, 16 images]
[im 12/177  soft-tissue]
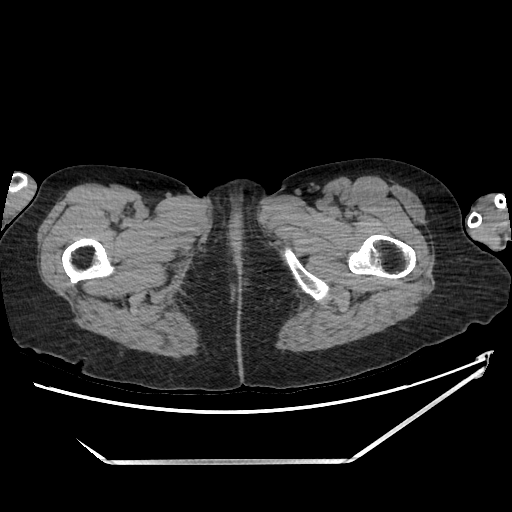
[im 12/177  bone]
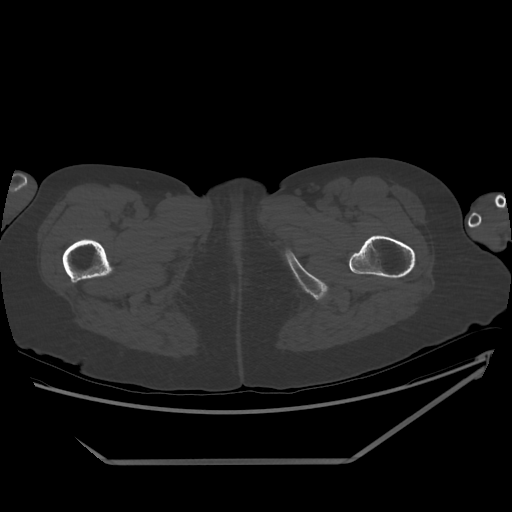
[im 23/177  soft-tissue]
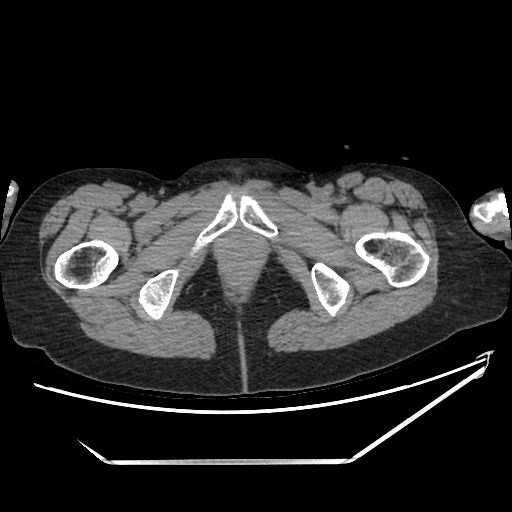
[im 35/177  soft-tissue]
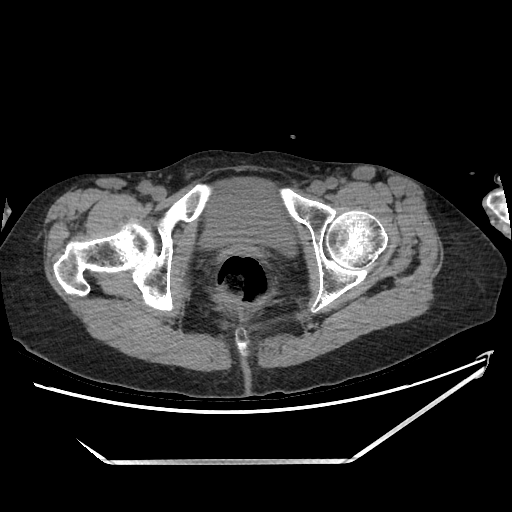
[im 46/177  soft-tissue]
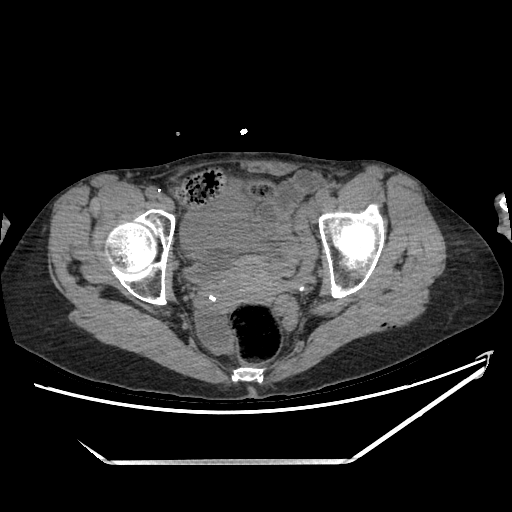
[im 57/177  soft-tissue]
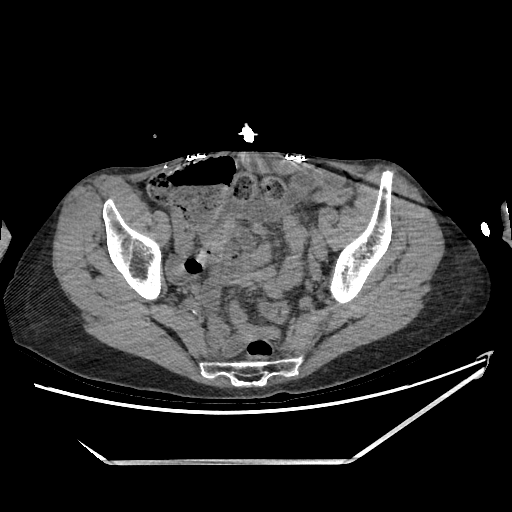
[im 69/177  soft-tissue]
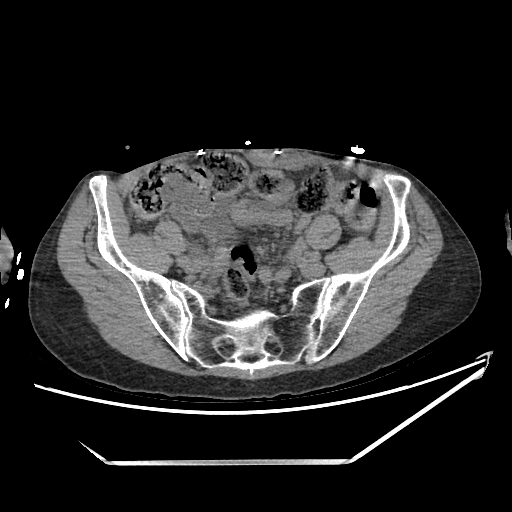
[im 80/177  soft-tissue]
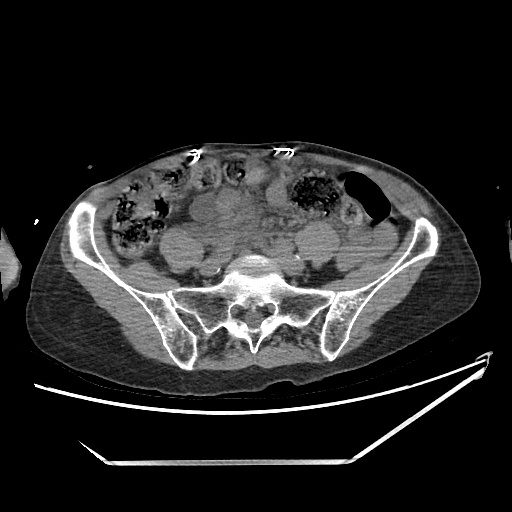
[im 97/177  soft-tissue]
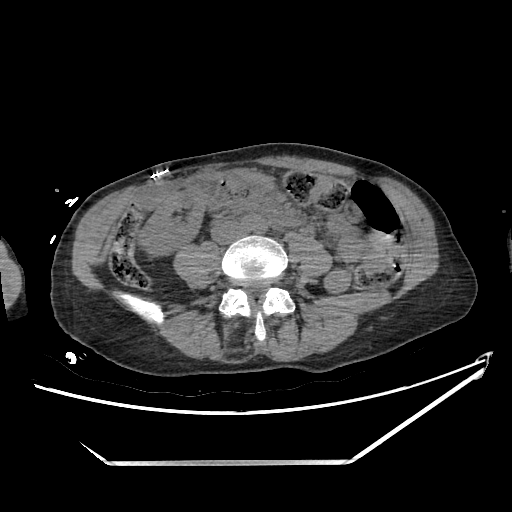
[im 108/177  soft-tissue]
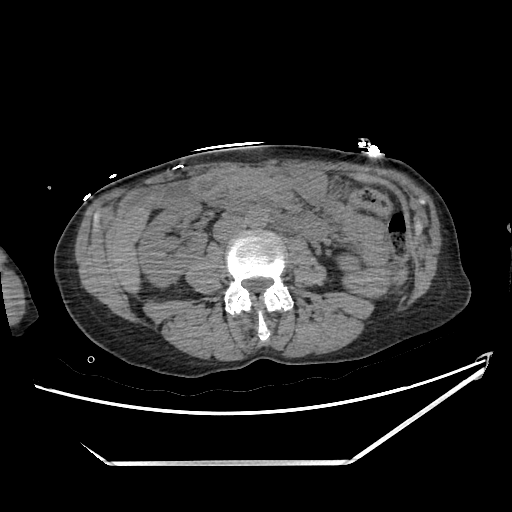
[im 108/177  bone]
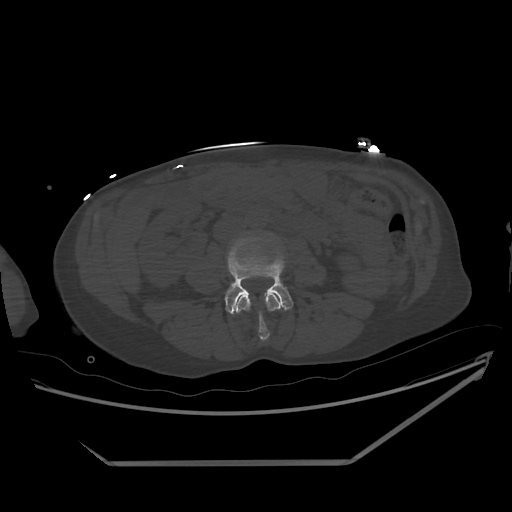
[im 120/177  soft-tissue]
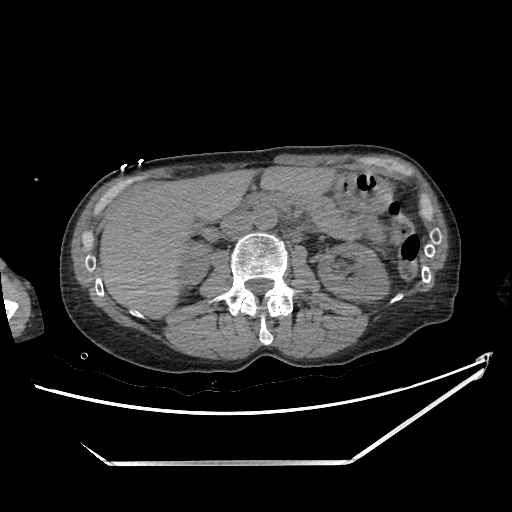
[im 131/177  soft-tissue]
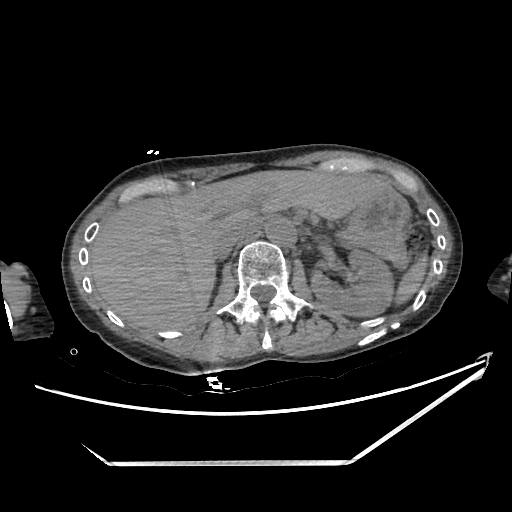
[im 142/177  soft-tissue]
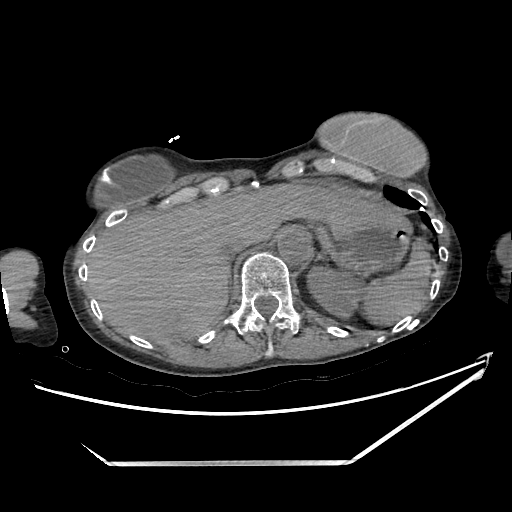
[im 154/177  soft-tissue]
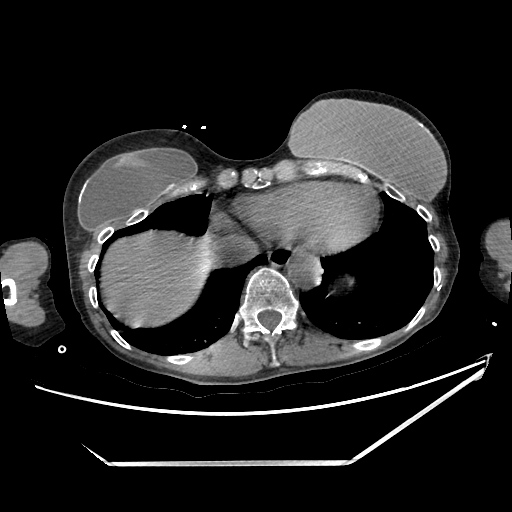
[im 165/177  soft-tissue]
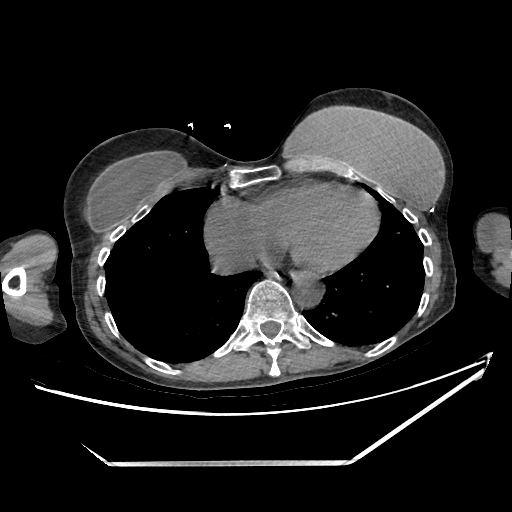

[Series 602: sag standard 2x2 · sagittal · 0.86mm/px · 3 of 198 slices shown]
[im 66/198  soft-tissue]
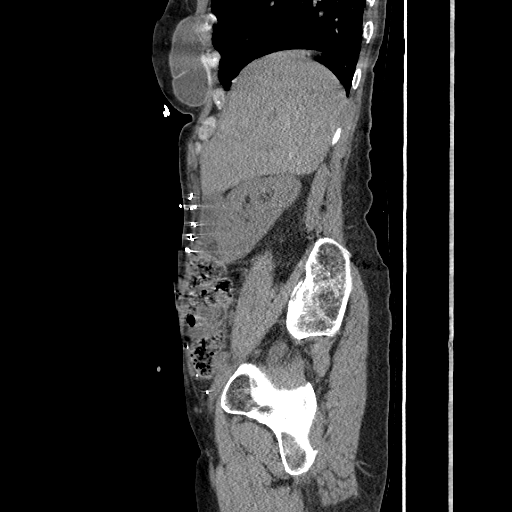
[im 88/198  soft-tissue]
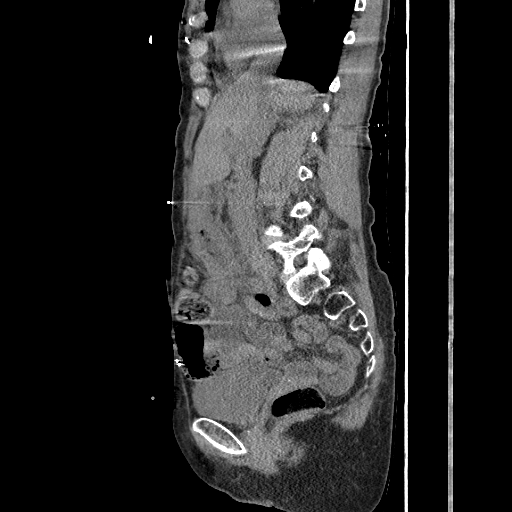
[im 110/198  soft-tissue]
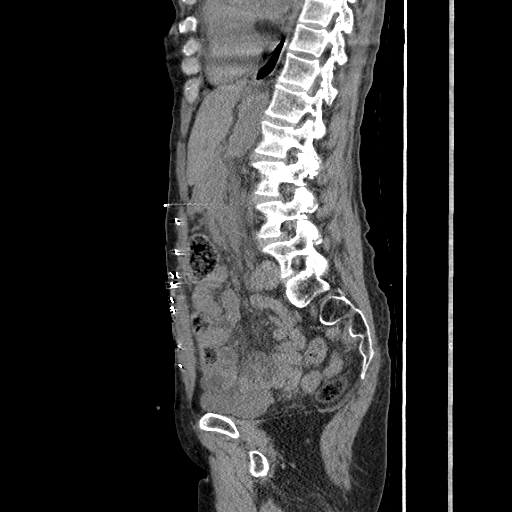

[17 of 46 positions shown; findings below may reference images not displayed]

FINDINGS: Lung bases: Unremarkable.
Gallbladder and biliary tree: Unremarkable.
Liver: Unremarkable.
Adrenal glands: Unremarkable.
Kidneys and ureters: Left renal sinus cysts. No hydronephrosis or ureter.
Pancreas: Unremarkable.
Spleen: Unremarkable.
Vessels: The abdominal aorta is nonaneurysmal.
Peritoneum: No pneumoperitoneum or free intraperitoneal fluid collections.
Bowel: Moderate stool throughout the colon. Appendix not well visualized. No asymmetric right lower quadrant or pericecal inflammation.
Bladder: Unremarkable.
Reproductive organs: Unremarkable.
Lymph nodes
Retroperitoneal: Unremarkable.
Mesenteric: Unremarkable.
Pelvic: Unremarkable.
Abdominal wall: Postsurgical changes seen along the anterior abdominal wall.
Bones: Unremarkable.
IMPRESSION: 
IMPRESSION: No acute findings by noncontrast CT.

## 2021-06-01 ENCOUNTER — Other Ambulatory Visit (INDEPENDENT_AMBULATORY_CARE_PROVIDER_SITE_OTHER): Payer: Self-pay | Admitting: FAMILY MEDICINE

## 2021-06-12 ENCOUNTER — Other Ambulatory Visit (INDEPENDENT_AMBULATORY_CARE_PROVIDER_SITE_OTHER): Payer: Self-pay | Admitting: FAMILY MEDICINE

## 2021-06-12 MED ORDER — GABAPENTIN 600 MG TABLET
600.0000 mg | ORAL_TABLET | Freq: Three times a day (TID) | ORAL | 0 refills | Status: DC
Start: 2021-06-12 — End: 2021-07-14

## 2021-06-26 NOTE — Progress Notes (Unsigned)
Family Medicine, [facility]  [place] [address]  [phone]      Patient Name:  Gwendolyn Crawford  MRN:  [id number]  DOB:  1955/07/11    Date of Service:  06/28/2021    Chief Complaint: No chief complaint on file.      Subjective:  Gwendolyn Crawford is a 66 y.o. female who returns ***    Past Medical History:  Past Medical History:   Diagnosis Date   . Allergic rhinitis    . Anxiety    . Arthritis    . Depression    . Diabetes mellitus, type 2 (CMS HCC)    . Gout    . Headache    . Hypercholesterolemia    . Hypertension    . Hypothyroid    . Hypothyroidism    . Insomnia    . PONV (postoperative nausea and vomiting)    . RLS (restless legs syndrome)    . Vitamin deficiency    . Wears glasses             Past Surgical History:  Past Surgical History:   Procedure Laterality Date   . COLONOSCOPY     . ESOPHAGOGASTRODUODENOSCOPY     . HX BREAST BIOPSY Left     HX BENIGN BREAST BX UNSURE WHICH SIDE   . HX CHOLECYSTECTOMY     . HX DENTAL EXTRACTION              Review of Systems:  Review of Systems   Constitutional: Negative for fever.   HENT: Negative for sore throat.    Eyes: Negative for blurred vision.   Respiratory: Negative for cough and shortness of breath.    Cardiovascular: Negative for chest pain.   Gastrointestinal: Negative for abdominal pain, nausea and vomiting.   Genitourinary: Negative for dysuria.   Musculoskeletal: Negative for falls.   Skin: Negative for rash.   Neurological: Negative for headaches.         Current Outpatient Medications   Medication Sig   . acetaminophen (TYLENOL) 325 mg Oral Tablet Take 1 Tablet (325 mg total) by mouth Every 4 hours as needed for Pain   . allopurinoL (ZYLOPRIM) 100 mg Oral Tablet TAKE ONE TABLET BY MOUTH TWO TIMES A DAY   . ascorbic acid (VITAMIN C) 1,000 mg Oral Tablet Take 1 Tablet (1,000 mg total) by mouth Once a day   . aspirin (ECOTRIN) 81 mg Oral Tablet, Delayed Release (E.C.) Take 1 Tablet (81 mg total) by mouth Once a day  Pt reports Taking once weekly   . atorvastatin (LIPITOR) 10 mg Oral Tablet Take 1 Tablet (10 mg total) by mouth Once a day   . cholecalciferol, vitamin D3, 25 mcg (1,000 unit) Oral Tablet Take 1 Tablet (1,000 Units total) by mouth Once a day   . cinnamon bark (CINNAMON ORAL) Take 1,000 mg by mouth bid   . cyanocobalamin (VITAMIN B 12) 1,000 mcg Oral Tablet Take 1 Tablet (1,000 mcg total) by mouth Once a day   . DULoxetine (CYMBALTA DR) 20 mg Oral Capsule, Delayed Release(E.C.) TAKE ONE CAPSULE BY MOUTH EVERY DAY   . gabapentin (NEURONTIN) 600 mg Oral Tablet Take 1 Tablet (600 mg total) by mouth Three times a day for 30 days Indications: diabetic complication causing injury to some body nerves   . glimepiride (AMARYL) 1 mg Oral Tablet Take 1 Tablet (1 mg total) by mouth Every morning with breakfast   .  levothyroxine (SYNTHROID) 100 mcg Oral Tablet Take 1 Tablet (100 mcg total) by mouth Every morning   . lisinopriL (PRINIVIL) 10 mg Oral Tablet Take 1 Tablet (10 mg total) by mouth Once a day for 90 days Indications: high blood pressure   . loratadine (CLARITIN) 10 mg Oral Tablet Take 1 Tablet (10 mg total) by mouth Once a day   . meclizine (ANTIVERT) 25 mg Oral Tablet Take 1 Tablet (25 mg total) by mouth Every 8 hours as needed   . metFORMIN (GLUCOPHAGE) 500 mg Oral Tablet TAKE ONE TABLET BY MOUTH TWO TIMES A DAY WITH FOOD FOR BLOOD SUGAR   . naproxen Sodium (ALEVE) 220 mg Oral Tablet Take 1 Tablet (220 mg total) by mouth Every 12 hours as needed   . triamcinolone acetonide (ARISTOCORT) 0.5 % Cream        Allergies   Allergen Reactions   . Zocor [Simvastatin] Myalgia   . Crestor [Rosuvastatin]  Other Adverse Reaction (Add comment) and Myalgia     unknown       Objective:    There were no vitals taken for this visit.      There is no height or weight on file to calculate BMI.     Physical Exam:  Constitutional: she is oriented to person, place, and time and well-developed, well-nourished, and in no distress.   HENT:    Head: Normocephalic.   Eyes: Pupils are equal, round, and reactive to light. Conjunctivae and EOM are normal. Right eye exhibits no discharge. Left eye exhibits no discharge.   Neck: Normal range of motion. Neck supple. No thyromegaly present.   Cardiovascular: Normal rate, regular rhythm, normal heart sounds and intact distal pulses. Exam reveals no friction rub.   No murmur heard.  Pulmonary/Chest: Breath sounds normal. No respiratory distress. she  has no wheezes. she  has no rales.   Musculoskeletal:         General: No tenderness or edema.   Lymphadenopathy:     she  has no cervical adenopathy.   Neurological she  is alert and oriented to person, place, and time.   Skin: Skin is warm and dry.   Psychiatric: Affect normal.    Diabetic foot exam:  No edema bilaterally. No ulcerations or lesions bilaterally. Pulses normal bilaterally.        Data reviewed:  HEMOGLOBIN A1C  Lab Results   Component Value Date    HA1C 6.0 10/05/2020     BASIC METABOLIC PANEL  Lab Results   Component Value Date    SODIUM 145 10/05/2020    POTASSIUM 3.9 10/05/2020    CHLORIDE 109 10/05/2020    CO2 25 10/05/2020    ANIONGAP 11 10/05/2020    BUN 8 10/05/2020    CREATININE 0.68 10/05/2020    BUNCRRATIO 12 10/05/2020    GFR >90 10/05/2020    CALCIUM 10.1 03/28/2021    GLUCOSENF 74 10/05/2020         Recent Results (from the past 17520 hour(s))   MAMMO BILATERAL SCREENING-ADDL VIEWS/BREAST US AS REQ BY RAD    Collection Time: 01/05/21 11:13 AM    Narrative    Computer aided detection (CAD) technology has been applied to the standard   (CC and MLO) images.    3D tomosynthesis and 2D images were acquired and reviewed    FILMS COMPARED:  Compared to: 01/04/2020 MAMMO BILATERAL SCREENING-ADDL VIEWS/BREAST US AS   REQ BY RAD, 10/27/2018 MAMMO BILATERAL SCREENING-ADDL VIEWS/BREAST US AS  REQ BY RAD, and 10/24/2017 MAMMO BILAT SCREENING WO TOMO-ADDL VIEWS/BREAST   US AS REQ BY RAD    HISTORY:  Procedure: MAMMO BILATERAL SCREENING W TOMO-ADDL  VIEWS/BREAST US AS REQ BY   RAD  Reason for exam: Breast cancer screening by mammogram      FINDINGS:  The breasts are almost entirely fatty.    Bilateral  There is no evidence of suspicious masses, calcifications, or other   abnormal findings.      Impression    Follow up mammogram in 1 year is recommended for both breasts.    BI-RADS ATLAS category (overall): 1 - Negative        Social Determinants identified with potential adverse effects on health outcomes:  none           Assessment/Plan:  No diagnosis found.       Plan:          Treatment plan was discussed with patient and shared decision making employed. The patient was encouraged to call with any additional questions or concerns.     Discussed with patient effects and side effects of medications. Medication safety was discussed. A good faith effort was made to reconcile the patient's medications.   No orders of the defined types were placed in this encounter.        Follow up:  No follow-ups on file.    This note was partially generated using MModal Fluency Direct system, and there may be some incorrect words, spellings, and punctuation that were not noted in checking the note before saving.    Charlie Pitter, MD  06/26/2021, 11:13

## 2021-06-28 ENCOUNTER — Other Ambulatory Visit: Payer: Self-pay

## 2021-06-28 ENCOUNTER — Ambulatory Visit (INDEPENDENT_AMBULATORY_CARE_PROVIDER_SITE_OTHER): Payer: Medicare Other

## 2021-06-28 ENCOUNTER — Encounter (INDEPENDENT_AMBULATORY_CARE_PROVIDER_SITE_OTHER): Payer: Self-pay | Admitting: FAMILY MEDICINE

## 2021-06-28 ENCOUNTER — Encounter (INDEPENDENT_AMBULATORY_CARE_PROVIDER_SITE_OTHER): Payer: Self-pay

## 2021-06-28 ENCOUNTER — Ambulatory Visit: Payer: Medicare Other | Attending: FAMILY MEDICINE | Admitting: FAMILY MEDICINE

## 2021-06-28 VITALS — BP 130/84 | HR 76 | Temp 97.1°F | Resp 16 | Ht 64.0 in | Wt 187.8 lb

## 2021-06-28 VITALS — BP 130/84 | HR 76 | Temp 97.1°F | Resp 18 | Ht 64.0 in | Wt 187.8 lb

## 2021-06-28 DIAGNOSIS — I10 Essential (primary) hypertension: Secondary | ICD-10-CM | POA: Insufficient documentation

## 2021-06-28 DIAGNOSIS — E782 Mixed hyperlipidemia: Secondary | ICD-10-CM | POA: Insufficient documentation

## 2021-06-28 DIAGNOSIS — Z0000 Encounter for general adult medical examination without abnormal findings: Secondary | ICD-10-CM | POA: Insufficient documentation

## 2021-06-28 DIAGNOSIS — Z87891 Personal history of nicotine dependence: Secondary | ICD-10-CM | POA: Insufficient documentation

## 2021-06-28 DIAGNOSIS — E119 Type 2 diabetes mellitus without complications: Secondary | ICD-10-CM

## 2021-06-28 DIAGNOSIS — F325 Major depressive disorder, single episode, in full remission: Secondary | ICD-10-CM

## 2021-06-28 DIAGNOSIS — E039 Hypothyroidism, unspecified: Secondary | ICD-10-CM | POA: Insufficient documentation

## 2021-06-28 DIAGNOSIS — G2581 Restless legs syndrome: Secondary | ICD-10-CM | POA: Insufficient documentation

## 2021-06-28 DIAGNOSIS — F419 Anxiety disorder, unspecified: Secondary | ICD-10-CM | POA: Insufficient documentation

## 2021-06-28 DIAGNOSIS — Z7984 Long term (current) use of oral hypoglycemic drugs: Secondary | ICD-10-CM | POA: Insufficient documentation

## 2021-06-28 LAB — LIPID PANEL
CHOL/HDL RATIO: 2.6
CHOLESTEROL: 131 mg/dL (ref 100–200)
HDL CHOL: 50 mg/dL (ref >=50)
LDL CALC: 55 mg/dL (ref <100)
NON-HDL: 81 mg/dL (ref <=190)
TRIGLYCERIDES: 152 mg/dL — ABNORMAL HIGH (ref <150)
VLDL CALC: 22 mg/dL (ref <30)

## 2021-06-28 LAB — POCT HGB A1C: POCT HGB A1C: 7.5 % — AB (ref 4–6)

## 2021-06-28 LAB — COMPREHENSIVE METABOLIC PNL, FASTING
ALBUMIN: 4.6 g/dL (ref 3.4–4.8)
ALKALINE PHOSPHATASE: 58 U/L (ref 55–145)
ALT (SGPT): 22 U/L (ref 8–22)
ANION GAP: 13 mmol/L (ref 4–13)
AST (SGOT): 24 U/L (ref 8–45)
BILIRUBIN TOTAL: 1.8 mg/dL — ABNORMAL HIGH (ref 0.3–1.3)
BUN/CREA RATIO: 10 (ref 6–22)
BUN: 9 mg/dL (ref 8–25)
CALCIUM: 10.2 mg/dL (ref 8.8–10.2)
CHLORIDE: 104 mmol/L (ref 96–111)
CO2 TOTAL: 24 mmol/L (ref 23–31)
CREATININE: 0.86 mg/dL (ref 0.60–1.05)
ESTIMATED GFR: 74 mL/min/BSA (ref >=60)
GLUCOSE: 128 mg/dL — ABNORMAL HIGH (ref 70–99)
POTASSIUM: 4.1 mmol/L (ref 3.5–5.1)
PROTEIN TOTAL: 7.6 g/dL (ref 6.0–8.0)
SODIUM: 141 mmol/L (ref 136–145)

## 2021-06-28 LAB — CBC WITH DIFF
BASOPHIL #: <0.1 10*3/uL (ref <=0.20)
BASOPHIL %: 0 %
EOSINOPHIL #: <0.1 10*3/uL (ref <=0.50)
EOSINOPHIL %: 1 %
HCT: 45.1 % (ref 34.8–46.0)
HGB: 14.8 g/dL (ref 11.5–16.0)
IMMATURE GRANULOCYTE #: <0.1 10*3/uL (ref <0.10)
IMMATURE GRANULOCYTE %: 0 % (ref 0–1)
LYMPHOCYTE #: 3.24 10*3/uL (ref 1.00–4.80)
LYMPHOCYTE %: 49 %
MCH: 30.5 pg (ref 26.0–32.0)
MCHC: 32.8 g/dL (ref 31.0–35.5)
MCV: 93 fL (ref 78.0–100.0)
MONOCYTE #: 0.35 10*3/uL (ref 0.20–1.10)
MONOCYTE %: 5 %
MPV: 11.9 fL (ref 8.7–12.5)
NEUTROPHIL #: 2.99 10*3/uL (ref 1.50–7.70)
NEUTROPHIL %: 45 %
PLATELETS: 231 10*3/uL (ref 150–400)
RBC: 4.85 10*6/uL (ref 3.85–5.22)
RDW-CV: 12.6 % (ref 11.5–15.5)
WBC: 6.7 10*3/uL (ref 3.7–11.0)

## 2021-06-28 LAB — THYROID STIMULATING HORMONE (SENSITIVE TSH): TSH: 0.513 u[IU]/mL (ref 0.430–3.550)

## 2021-06-28 LAB — PARATHYROID HORMONE (PTH): PTH: 77.6 pg/mL — ABNORMAL HIGH (ref 8.5–77.0)

## 2021-06-28 NOTE — Progress Notes (Signed)
FAMILY MEDICINE, [facility]  [place] [address]    Medicare Annual Wellness Visit    Name: Gwendolyn Crawford MRN:  [id number]   Date: 06/28/2021 Age: 66 y.o.       SUBJECTIVE:   Gwendolyn Crawford is a 66 y.o. female for presenting for Medicare Wellness exam.   I have reviewed and reconciled the medication list with the patient today.        10/05/2020    10:00 AM 06/28/2021     9:00 AM   Comprehensive Health Assessment-Adult   Do you wish to complete this form? Yes Yes   During the past 4 weeks, how would you rate your health in general? Fair Good   During the past 4 weeks, how much difficulty have you had doing your usual activities inside and outside your home because of medical or emotional problems? A little bit of difficulty No difficulty at all   During the past 4 weeks, was someone available to help you if you needed and wanted help? Yes, as much as I wanted Yes, quite a bit   In the past year, how many times have you gone to the emergency department or been admitted to a hospital for a health problem? None None   Are you generally satisfied with your sleep? No Yes   Do you have enough money to buy things you need in everyday life, such as food, clothing, medicines, and housing? Yes, always Yes, always   Can you get to places beyond walking distance without help?  (For example, can you drive your own car or travel alone on buses)? Yes Yes   Do you fasten your seatbelt when you are in a car? Yes, usually Yes, usually   Do you exercise 20 minutes 3 or more days per week (such as walking, dancing, biking, mowing grass, swimming)? Yes, most of the time Yes, most of the time   How often do you eat food that is healthy (fruits, vegetables, lean meats) instead of unhealthy (sweets, fast food, junk food, fatty foods)? Most of the time Most of the time   How often do you have trouble taking medicines the eay you are told to take them? I always take them as prescribed I always take  them as prescribed   Do you need any help communicating with your doctors and nurses because of vision or hearing problems? No No   During the past 12 months, have you experienced confusion or memory loss that is happening more often or is getting worse? Yes No   Do you have one person you think of as your personal doctor (primary care provider or family doctor)?  Yes   If you are seeing a Primary Care Provider (PCP) or family doctor. please list their name  Charlie Pitter   Are you now also seeing any specialist physician(s) (such as eye doctor, foot doctor, skin doctor)? No Yes   If you are seeing a specialist for anything such as foot, eye, skin, etc.  please list their name(s)  Dr. Nathaniel Man, [place]   How confident are you that you can control or manage most of your health problems? Very confident Somewhat confident         I have reviewed and updated as appropriate the past medical, family and social history. 06/28/2021 as summarized below:  Past Medical History:   Diagnosis Date   . Allergic rhinitis    . Anxiety    .  Arthritis    . Depression    . Diabetes mellitus, type 2 (CMS HCC)    . Gout    . Headache    . Hypercholesterolemia    . Hypertension    . Hypothyroid    . Hypothyroidism    . Insomnia    . PONV (postoperative nausea and vomiting)    . RLS (restless legs syndrome)    . Vitamin deficiency    . Wears glasses      Past Surgical History:   Procedure Laterality Date   . Colonoscopy     . Esophagogastroduodenoscopy     . Hx breast biopsy Left    . Hx cholecystectomy     . Hx dental extraction       Current Outpatient Medications   Medication Sig   . acetaminophen (TYLENOL) 325 mg Oral Tablet Take 1 Tablet (325 mg total) by mouth Every 4 hours as needed for Pain   . allopurinoL (ZYLOPRIM) 100 mg Oral Tablet TAKE ONE TABLET BY MOUTH TWO TIMES A DAY   . ascorbic acid (VITAMIN C) 1,000 mg Oral Tablet Take 1 Tablet (1,000 mg total) by mouth Once a day   . aspirin  (ECOTRIN) 81 mg Oral Tablet, Delayed Release (E.C.) Take 1 Tablet (81 mg total) by mouth Once a day Pt reports Taking once weekly (Patient not taking: Reported on 06/28/2021)   . atorvastatin (LIPITOR) 10 mg Oral Tablet Take 1 Tablet (10 mg total) by mouth Once a day   . cholecalciferol, vitamin D3, 25 mcg (1,000 unit) Oral Tablet Take 1 Tablet (1,000 Units total) by mouth Once a day   . cinnamon bark (CINNAMON ORAL) Take 1,000 mg by mouth bid   . cyanocobalamin (VITAMIN B 12) 1,000 mcg Oral Tablet Take 1 Tablet (1,000 mcg total) by mouth Once a day   . DULoxetine (CYMBALTA DR) 20 mg Oral Capsule, Delayed Release(E.C.) TAKE ONE CAPSULE BY MOUTH EVERY DAY   . gabapentin (NEURONTIN) 600 mg Oral Tablet Take 1 Tablet (600 mg total) by mouth Three times a day for 30 days Indications: diabetic complication causing injury to some body nerves   . glimepiride (AMARYL) 1 mg Oral Tablet Take 1 Tablet (1 mg total) by mouth Every morning with breakfast   . levothyroxine (SYNTHROID) 100 mcg Oral Tablet Take 1 Tablet (100 mcg total) by mouth Every morning   . lisinopriL (PRINIVIL) 10 mg Oral Tablet Take 1 Tablet (10 mg total) by mouth Once a day for 90 days Indications: high blood pressure   . loratadine (CLARITIN) 10 mg Oral Tablet Take 1 Tablet (10 mg total) by mouth Once a day   . meclizine (ANTIVERT) 25 mg Oral Tablet Take 1 Tablet (25 mg total) by mouth Every 8 hours as needed   . metFORMIN (GLUCOPHAGE) 500 mg Oral Tablet TAKE ONE TABLET BY MOUTH TWO TIMES A DAY WITH FOOD FOR BLOOD SUGAR   . naproxen Sodium (ALEVE) 220 mg Oral Tablet Take 1 Tablet (220 mg total) by mouth Every 12 hours as needed   . triamcinolone acetonide (ARISTOCORT) 0.5 % Cream      Family Medical History:     Problem Relation (Age of Onset)    Alzheimer's/Dementia Mother, Sister    COPD Sister    Kidney Disease Father    Stroke Mother, Sister          Social History     Socioeconomic History   . Marital status: Married   Tobacco Use   .  Smoking status:  Former     Years: 1.00     Types: Cigarettes     Passive exposure: Past   . Smokeless tobacco: Never   Vaping Use   . Vaping Use: Never used   Substance and Sexual Activity   . Alcohol use: Not Currently   . Drug use: Never   Other Topics Concern   . Ability to Walk 1 Flight of Steps without SOB/CP No   . Routine Exercise Yes   . Ability To Do Own ADL's Yes        List of Current Health Care Providers   Care Team     PCP     Name Type Specialty Phone Number    Charlie Pitter, MD Physician [place] [phone]          Care Team     Name Type Specialty Phone Number    Barnabas Lister, [place] Not available [employer] Not available Not available Not available                  Health Maintenance   Topic Date Due   . Diabetic Kidney Health Microalb/Cr Ratio  Never done   . Diabetic A1C  04/04/2021   . Covid-19 Vaccine (6 - Pfizer series) 05/30/2021   . Diabetic Kidney Health eGFR  10/05/2021   . Diabetic Retinal Exam  12/31/2021   . Mammography  01/06/2023   . Colonoscopy  10/02/2028   . Adult Tdap-Td (2 - Td or Tdap) 01/22/2029   . Osteoporosis screening  06/10/2030   . Hepatitis C screening  Completed   . Influenza Vaccine  Completed   . Shingles Vaccine  Completed   . Annual Wellness Visit - Calendar Year Insurers  Completed   . Pneumococcal Vaccination, Age 27+  Completed   . Meningococcal Vaccine  Aged Out   . Depression Screening  Discontinued   . Pap smear  Discontinued   . HIV Screening  Discontinued     Medicare Wellness Assessment   Medicare initial or wellness physical in the last year?: No  Advance Directives (optional)   Does patient have a living will or MPOA: YES   Has patient provided [employer] with a copy?: YES   Advance directive information given to the patient today?: YES      Activities of Daily Living   Do you need help with dressing, bathing, or walking?: No   Do you need help with shopping, housekeeping, medications, or finances?: No   Do you have rugs  in hallways, broken steps, or poor lighting?: No   Do you have grab bars in your bathroom, non-slip strips in your tub, and hand rails on your stairs?: No (No grab bars)   Urinary Incontinence Screen (Women >=65 only)   Do you ever leak urine when you don't want to?: YES   Cognitive Function Screen (1=Yes, 0=No)   What is you age?: Correct   What is the time to the nearest hour?: Correct   What is the year?: Correct   What is the name of this clinic?: Correct   Can the patient recognize two persons (the doctor, the nurse, home help, etc.)?: Correct   What is the date of your birth? (day and month sufficient) : Correct   In what year did World War II end?: Incorrect   Who is the current president of the [place]?: Correct   Count from 20 down to  1?: Correct   What address did I give you earlier?: Correct   Total Score: 9       Hearing Screen   Have you noticed any hearing difficulties?: No  After whispering 9-1-6 how many numbers did the patient repeat correctly?: 2  After whispering 4-7-8 how many numbers did the patient repeat correctly?: 3   Fall Risk Screen   Do you feel unsteady when standing or walking?: Yes  Do you worry about falling?: Yes  Have you fallen in the past year?: No  Timed up and go test (in seconds): 10   Vision Screen   Right Eye = 20: 25 (20/25 Corrected)   Left Eye = 20: 20 (20/20 Corrected)   Depression Screen   Little interest or pleasure in doing things.: Several Days (Some days)  Feeling down, depressed, or hopeless: Not at all  PHQ 2 Total: 1  Trouble falling or staying asleep, or sleeping too much.: Several Days (Some days)  Feeling tired or having little energy: Several Days  Poor appetite or overeating: Several Days  Feeling bad about yourself/ that you are a failure in the past 2 weeks?: Not at all  Trouble concentrating on things in the past 2 weeks?: Several Days  Moving/Speaking slowly or being fidgety or restless  in the past 2 weeks?: Not at all  Thoughts that you would be  better off DEAD, or of hurting yourself in some way.: Not at all  PHQ 9 Total: 5   Pain Score   Pain Score:   4    Substance Use-Abuse Screening   Tobacco Use   In Past 12 MONTHS, how often have you used any tobacco product (for example, cigarettes, e-cigarettes, cigars, pipes, or smokeless tobacco)?: Never   Alcohol use   In the PAST 12 MONTHS, how often have you had 5 (men)/4 (women) or more drinks containing alcohol in one day?: Never   Prescription Drug Use   In the PAST 12 months, how often have you used any prescription medications just for the feeling, more than prescribed, or that were not prescribed for you? Prescriptions may include: opioids, benzodiazepines, medications for ADHD: Never         Illicit Drug Use   In the PAST 12 MONTHS, how often have you used any drugs, including marijuana, cocaine or crack, heroin, methamphetamine, hallucinogens, ecstasy/MDMA?: Never           OBJECTIVE:   BP 130/84 (Site: Left, Patient Position: Sitting, Cuff Size: Adult)   Pulse 76   Temp 36.2 C (97.1 F) (Thermal Scan)   Resp 18   Ht 1.626 m ('5\' 4"' )   Wt 85.2 kg (187 lb 12.8 oz)   SpO2 97%   BMI 32.24 kg/m        Other appropriate exam:    Health Maintenance Due   Topic Date Due   . Diabetic Kidney Health Microalb/Cr Ratio  Never done   . Diabetic A1C  04/04/2021   . Covid-19 Vaccine (6 - Pfizer series) 05/30/2021      ASSESSMENT & PLAN:   No diagnosis found.   Identified Risk Factors/ Recommended Actions     Fall Risk Follow up plan of care: Footwear and potential problems addressed    Urinary Incontinence Plan of Care: Behavioral interventions with bladder training, pelvic floor muscle training, and/or prompted voiding        No orders of the defined types were placed in this encounter.  The patient has been educated about risk factors and recommended preventive care. Written Prevention Plan completed/ updated and given to patient (see After Visit Summary).    No follow-ups on file.    Michelene Heady,  RN

## 2021-06-28 NOTE — Patient Instructions (Addendum)
Pt. To continue to have annual wellness visits.  Pt. Instructed to have grab bars installed in bathroom to prevent falls.  Pt. To discuss with PCP regarding Covid vaccine.  Pt. To discuss with PCP regarding obtaining a HgA1C.  Pt. Encouraged to continue eating a well balanced diet.  Pt. Encouraged to continue to exercise 3 or more days a week as tolerated.  Medicare Preventive Services  Medicare coverage information Recommendation for YOU   Heart Disease and Diabetes   Lipid profile Every 5 years or more often if at risk for cardiovascular disease  Last Lipid Panel  (Last result in the past 2 years)        Cholesterol   HDL   LDL   Direct LDL   Triglycerides      10/05/20 1340 139   51   62  Comment: <100 mg/dL, Optimal  100-129 mg/dL, Near/Above Optimal  130-159 mg/dL, Borderline High  160-189 mg/dL, High  >=190 mg/dL, Very high     128             Diabetes Screening  yearly for those at risk for diabetes, 2 tests per year for those with prediabetes Last Glucose: 74    Diabetes Self Management Training or Medical Nutrition Therapy  For those with diabetes, up to 10 hrs initial training within a year, subsequent years up to 2 hrs of follow up training Optional for those with diabetes     Medical Nutrition Therapy Three hours of one-on-one counseling in first year, two hours in subsequent years Optional for those with diabetes, kidney disease   Intensive Behavioral Therapy for Obesity  Face-to-face counseling, first month every week, month 2-6 every other week, month 7-12 every month if continued progress is documented Optional for those with Body Mass Index 30 or higher  Your Body mass index is 32.24 kg/m.   Tobacco Cessation (Quitting) Counseling   Two attempts per year, max 4 sessions per attempt, up to 8 per year, for those with tobacco-related health condition Optional for those that use tobacco   Cancer Screening   Colorectal screening   For anyone age 24 to 18 or any age if high risk:  . Screening Colonoscopy  every 10 yrs if low risk,  more frequent if higher risk  OR  . Cologuard Stool DNA test once every 3 years OR  . Fecal Occult Blood Testing yearly OR  . Flexible  Sigmoidoscopy  every 5 yr OR  . CT Colonography every 5 yrs    See your schedule below   Screening Pap Test Recommended every 3 years for all women age 68 to 67, or every five years if combined with HPV test (routine screening not needed after total hysterectomy).  Medicare covers every 2 years, up to yearly if high risk.  Screening Pelvic Exam Medicare covers every 2 years, yearly if high risk or childbearing age with abnormal Pap in last 3 yrs. See your schedule below   Screening Mammogram   Recommended every 2 years for women age 25 to 29, or more frequent if you have a higher risk. Selectively recommended for women between 40-49 based on shared decisions about risk. Covered by Medicare up to every year for women age 30 or older See your schedule below   Lung Cancer Screening  Annual low dose computed tomography (LDCT scan) is recommended for those age 36-77 who smoked 20 pack-years and are current smokers or quit smoking within past 15 years (one pack-year= smoking  one PPD for one year), after counseling by your doctor or nurse clinician about the possible benefits or harms. See your schedule below   Vaccinations   Pneumococcal Vaccine: Recommended routinely age 72+ with one or two separate vaccines based on your risk    Recommended before age 308 if medical conditions with increased risk  Seasonal Influenza Vaccine: Once every flu season   Hepatitis B Vaccine: 3 doses if risk (including anyone with diabetes or liver disease)  Shingles Vaccine: Two doses at age 41 or older  Diphtheria Tetanus Pertussis Vaccine: ONCE as adult, booster every 10 years     Immunization History   Administered Date(s) Administered   . Covid-19 Vaccine,Pfizer-BioNTech,Purple Top,44yr+ 03/28/2019, 04/16/2019, 11/16/2019, 05/23/2020   . Influenza Vaccine, 6 month-adult  01/03/2004, 12/06/2010, 10/22/2017, 10/21/2018   . Pneumovax 03/10/2012   . Shingrix - Zoster Vaccine 02/26/2019, 06/16/2019   . Tetanus Toxoid/Diphtheria Toxoid/Acellular Pertussis Vaccine, Adsorbed (Tdap) 01/23/2019     Shingles vaccine and Diphtheria Tetanus Pertussis vaccines are available at pharmacies or local health department without a prescription.   Other Screening   Bone Densitometry   Every 24 months for anyone at risk, including postmenopausal       Glaucoma Screening   Yearly if in high risk group such as diabetes, family history, African American age 30890+or Hispanic American age 66+     Hepatitis C Screening recommended ONCE for those born between 1945-1965, or high risk for HCV infection       HIV Testing recommended routinely at least ONCE, covered every year for age 4731to 668regardless of risk, and every year for age over 639who ask for the test or higher risk  Yearly or up to 3 times in pregnancy         Abdominal Aortic Aneurysm Screening Ultrasound   Once between the age of 654-75with a family history of AAA       Your Personalized Schedule for Preventive Tests     Health Maintenance: Pending and Last Completed         Date Due Completion Date    Diabetic Kidney Health Microalb/Cr Ratio Never done ---    Diabetic A1C 04/04/2021 10/05/2020    Covid-19 Vaccine (6 - Pfizer series) 05/30/2021 01/30/2021    Diabetic Kidney Health eGFR 10/05/2021 10/05/2020    Diabetic Retinal Exam 12/31/2021 01/01/2020    Mammography 01/06/2023 01/05/2021    Colonoscopy 10/02/2028 10/03/2018    Adult Tdap-Td (2 - Td or Tdap) 01/22/2029 01/23/2019    Osteoporosis screening 06/10/2030 06/09/2020                Medicare Preventive Services  Medicare coverage information Recommendation for YOU   Heart Disease and Diabetes   Lipid profile Every 5 years or more often if at risk for cardiovascular disease  Last Lipid Panel  (Last result in the past 2 years)      Cholesterol   HDL   LDL   Direct LDL   Triglycerides      10/05/20  1340 139   51   62  Comment: <100 mg/dL, Optimal  100-129 mg/dL, Near/Above Optimal  130-159 mg/dL, Borderline High  160-189 mg/dL, High  >=190 mg/dL, Very high     128           Diabetes Screening  yearly for those at risk for diabetes, 2 tests per year for those with prediabetes Last Glucose: 74    Diabetes Self Management Training or  Medical Nutrition Therapy  For those with diabetes, up to 10 hrs initial training within a year, subsequent years up to 2 hrs of follow up training Optional for those with diabetes     Medical Nutrition Therapy Three hours of one-on-one counseling in first year, two hours in subsequent years Optional for those with diabetes, kidney disease   Intensive Behavioral Therapy for Obesity  Face-to-face counseling, first month every week, month 2-6 every other week, month 7-12 every month if continued progress is documented Optional for those with Body Mass Index 30 or higher  Your Body mass index is 32.24 kg/m.   Tobacco Cessation (Quitting) Counseling   Two attempts per year, max 4 sessions per attempt, up to 8 per year, for those with tobacco-related health condition Optional for those that use tobacco   Cancer Screening   Colorectal screening   For anyone age 46 to 41 or any age if high risk:  . Screening Colonoscopy every 10 yrs if low risk,  more frequent if higher risk  OR  . Cologuard Stool DNA test once every 3 years OR  . Fecal Occult Blood Testing yearly OR  . Flexible  Sigmoidoscopy  every 5 yr OR  . CT Colonography every 5 yrs    See your schedule below   Screening Pap Test Recommended every 3 years for all women age 33 to 74, or every five years if combined with HPV test (routine screening not needed after total hysterectomy).  Medicare covers every 2 years, up to yearly if high risk.  Screening Pelvic Exam Medicare covers every 2 years, yearly if high risk or childbearing age with abnormal Pap in last 3 yrs. See your schedule below   Screening Mammogram   Recommended every 2  years for women age 81 to 37, or more frequent if you have a higher risk. Selectively recommended for women between 40-49 based on shared decisions about risk. Covered by Medicare up to every year for women age 15 or older See your schedule below   Lung Cancer Screening  Annual low dose computed tomography (LDCT scan) is recommended for those age 48-77 who smoked 20 pack-years and are current smokers or quit smoking within past 15 years (one pack-year= smoking one PPD for one year), after counseling by your doctor or nurse clinician about the possible benefits or harms. See your schedule below   Vaccinations   Pneumococcal Vaccine: Recommended routinely age 71+ with one or two separate vaccines based on your risk    Recommended before age 49 if medical conditions with increased risk  Seasonal Influenza Vaccine: Once every flu season   Hepatitis B Vaccine: 3 doses if risk (including anyone with diabetes or liver disease)  Shingles Vaccine: Two doses at age 7 or older  Diphtheria Tetanus Pertussis Vaccine: ONCE as adult, booster every 10 years     Immunization History   Administered Date(s) Administered   . Covid-19 Vaccine,Pfizer-BioNTech,Purple Top,82yr+ 03/28/2019, 04/16/2019, 11/16/2019, 05/23/2020   . Influenza Vaccine, 6 month-adult 01/03/2004, 12/06/2010, 10/22/2017, 10/21/2018   . Pneumovax 03/10/2012   . Shingrix - Zoster Vaccine 02/26/2019, 06/16/2019   . Tetanus Toxoid/Diphtheria Toxoid/Acellular Pertussis Vaccine, Adsorbed (Tdap) 01/23/2019     Shingles vaccine and Diphtheria Tetanus Pertussis vaccines are available at pharmacies or local health department without a prescription.   Other Screening   Bone Densitometry   Every 24 months for anyone at risk, including postmenopausal       Glaucoma Screening   Yearly if in high  risk group such as diabetes, family history, African American age 20+ or Hispanic American age 72+      Hepatitis C Screening recommended ONCE for those born between 1945-1965, or high  risk for HCV infection       HIV Testing recommended routinely at least ONCE, covered every year for age 10 to 4 regardless of risk, and every year for age over 77 who ask for the test or higher risk  Yearly or up to 3 times in pregnancy         Abdominal Aortic Aneurysm Screening Ultrasound   Once between the age of 68-75 with a family history of AAA       Your Personalized Schedule for Preventive Tests   Health Maintenance: Pending and Last Completed       Date Due Completion Date    Diabetic Kidney Health Microalb/Cr Ratio Never done ---    Diabetic A1C 04/04/2021 10/05/2020    Covid-19 Vaccine (6 - Pfizer series) 05/30/2021 01/30/2021    Diabetic Kidney Health eGFR 10/05/2021 10/05/2020    Diabetic Retinal Exam 12/31/2021 01/01/2020    Mammography 01/06/2023 01/05/2021    Colonoscopy 10/02/2028 10/03/2018    Adult Tdap-Td (2 - Td or Tdap) 01/22/2029 01/23/2019    Osteoporosis screening 06/10/2030 06/09/2020

## 2021-06-28 NOTE — Nursing Note (Signed)
Follow up. Blood pressure readings from home brought with patient. States having trouble keeping sugar under control.

## 2021-06-30 ENCOUNTER — Telehealth (INDEPENDENT_AMBULATORY_CARE_PROVIDER_SITE_OTHER): Payer: Self-pay | Admitting: FAMILY MEDICINE

## 2021-06-30 DIAGNOSIS — R17 Unspecified jaundice: Secondary | ICD-10-CM

## 2021-06-30 NOTE — Telephone Encounter (Signed)
Informed patient and she understands.  She will be in in a couple of weeks for labs  Thamas Jaegers, MA

## 2021-06-30 NOTE — Telephone Encounter (Addendum)
Please let this patient know the results of her labs. Have her stop by and repeat her total bili in 1-2 weeks. Her PTH intact was just minimally elevated at 77.6 with normal up to 77. Also her A1c has drifted up to 7.5 from 6.0. Continue with diet,exercise and weight loss. Check to see how she is taking her Metformin and Glimepiride. Has she had issues if her Metformin has been increased in the past?

## 2021-07-12 ENCOUNTER — Ambulatory Visit: Payer: Medicare Other | Attending: FAMILY MEDICINE

## 2021-07-12 ENCOUNTER — Other Ambulatory Visit: Payer: Self-pay

## 2021-07-12 DIAGNOSIS — R17 Unspecified jaundice: Secondary | ICD-10-CM | POA: Insufficient documentation

## 2021-07-12 LAB — BILIRUBIN TOTAL: BILIRUBIN TOTAL: 1.4 mg/dL — ABNORMAL HIGH (ref 0.3–1.3)

## 2021-07-12 NOTE — Nursing Note (Signed)
Pt labs drawn. Jovin Fester, MA

## 2021-07-14 ENCOUNTER — Other Ambulatory Visit (INDEPENDENT_AMBULATORY_CARE_PROVIDER_SITE_OTHER): Payer: Self-pay | Admitting: FAMILY MEDICINE

## 2021-07-14 MED ORDER — GABAPENTIN 600 MG TABLET
600.0000 mg | ORAL_TABLET | Freq: Three times a day (TID) | ORAL | 0 refills | Status: DC
Start: 2021-07-14 — End: 2021-08-14

## 2021-07-16 ENCOUNTER — Telehealth (INDEPENDENT_AMBULATORY_CARE_PROVIDER_SITE_OTHER): Payer: Self-pay | Admitting: FAMILY MEDICINE

## 2021-07-16 DIAGNOSIS — R17 Unspecified jaundice: Secondary | ICD-10-CM

## 2021-07-16 NOTE — Telephone Encounter (Signed)
Please let this patient know that her total bili is better but still slightly high. Will check a RUQ u/s and have her repeat her LFTs in 2-3 weeks. Have her stop her Lipitor for now.

## 2021-07-17 ENCOUNTER — Telehealth (INDEPENDENT_AMBULATORY_CARE_PROVIDER_SITE_OTHER): Payer: Self-pay | Admitting: FAMILY MEDICINE

## 2021-07-17 NOTE — Telephone Encounter (Signed)
Pt is informed, states she probably will not have another US done, due to the fact she states she has recently done one.  States she will come in for repeat labs but unsure when she can get back in for this. Also verbalized understanding stopping Lipitor at this time.     Tereso Newcomer, [place]

## 2021-07-17 NOTE — Telephone Encounter (Signed)
Tried to reach pt to discuss - unable. Left vm asking pt return call. Derek Laughter, RN

## 2021-07-17 NOTE — Telephone Encounter (Signed)
Called and left a message that the [place] ruq is scheduled for July 13th @ 8:15 and arrive 30 min early to register and NPO after midnight the night before  [employer], Archivist

## 2021-08-03 ENCOUNTER — Ambulatory Visit: Payer: Medicare Other | Attending: FAMILY MEDICINE

## 2021-08-03 ENCOUNTER — Other Ambulatory Visit: Payer: Self-pay

## 2021-08-03 ENCOUNTER — Ambulatory Visit (HOSPITAL_COMMUNITY): Payer: Self-pay

## 2021-08-03 DIAGNOSIS — R17 Unspecified jaundice: Secondary | ICD-10-CM | POA: Insufficient documentation

## 2021-08-03 LAB — HEPATIC FUNCTION PANEL
ALBUMIN: 3.9 g/dL (ref 3.4–4.8)
ALKALINE PHOSPHATASE: 59 U/L (ref 55–145)
ALT (SGPT): 20 U/L (ref 8–22)
AST (SGOT): 19 U/L (ref 8–45)
BILIRUBIN DIRECT: 0.3 mg/dL (ref 0.1–0.4)
BILIRUBIN TOTAL: 0.9 mg/dL (ref 0.3–1.3)
PROTEIN TOTAL: 6.5 g/dL (ref 6.0–8.0)

## 2021-08-03 LAB — GAMMA GT: GGT: 33 U/L (ref 7–50)

## 2021-08-03 NOTE — Result Encounter Note (Signed)
I have reviewed these results and no active changes need to be done at this time. Please keep your follow up appointment and continue your current medications. If any problems go to the ER.

## 2021-08-03 NOTE — Nursing Note (Signed)
Pt labs drawn. Dezirae Service, MA

## 2021-08-14 ENCOUNTER — Other Ambulatory Visit (INDEPENDENT_AMBULATORY_CARE_PROVIDER_SITE_OTHER): Payer: Self-pay | Admitting: FAMILY MEDICINE

## 2021-08-31 ENCOUNTER — Other Ambulatory Visit (INDEPENDENT_AMBULATORY_CARE_PROVIDER_SITE_OTHER): Payer: Self-pay | Admitting: FAMILY MEDICINE

## 2021-08-31 MED ORDER — METFORMIN 500 MG TABLET
ORAL_TABLET | ORAL | 1 refills | Status: DC
Start: 2021-08-31 — End: 2021-11-17

## 2021-09-11 ENCOUNTER — Other Ambulatory Visit (INDEPENDENT_AMBULATORY_CARE_PROVIDER_SITE_OTHER): Payer: Self-pay | Admitting: FAMILY MEDICINE

## 2021-09-11 MED ORDER — GABAPENTIN 600 MG TABLET
600.0000 mg | ORAL_TABLET | Freq: Three times a day (TID) | ORAL | 0 refills | Status: DC
Start: 2021-09-11 — End: 2021-10-12

## 2021-09-11 NOTE — Telephone Encounter (Signed)
Pt called and lm for pt to return call.   Tereso Newcomer, [place]

## 2021-09-11 NOTE — Telephone Encounter (Signed)
Script for Gabapentin sent in. Urine drug screen up to date will need a NC at her next apt.

## 2021-09-14 ENCOUNTER — Other Ambulatory Visit (INDEPENDENT_AMBULATORY_CARE_PROVIDER_SITE_OTHER): Payer: Self-pay | Admitting: FAMILY MEDICINE

## 2021-10-12 ENCOUNTER — Other Ambulatory Visit (INDEPENDENT_AMBULATORY_CARE_PROVIDER_SITE_OTHER): Payer: Self-pay | Admitting: FAMILY MEDICINE

## 2021-10-12 MED ORDER — LISINOPRIL 10 MG TABLET
10.0000 mg | ORAL_TABLET | Freq: Every day | ORAL | 1 refills | Status: DC
Start: 2021-10-12 — End: 2021-10-30

## 2021-10-12 MED ORDER — GABAPENTIN 600 MG TABLET
600.0000 mg | ORAL_TABLET | Freq: Three times a day (TID) | ORAL | 0 refills | Status: DC
Start: 2021-10-12 — End: 2021-11-13

## 2021-10-20 ENCOUNTER — Other Ambulatory Visit (INDEPENDENT_AMBULATORY_CARE_PROVIDER_SITE_OTHER): Payer: Self-pay | Admitting: FAMILY MEDICINE

## 2021-10-20 MED ORDER — ALLOPURINOL 100 MG TABLET
ORAL_TABLET | ORAL | 1 refills | Status: DC
Start: 2021-10-20 — End: 2021-10-30

## 2021-10-20 NOTE — Telephone Encounter (Signed)
Patient called and left voicemail stating she needed this refill.

## 2021-10-29 NOTE — Progress Notes (Signed)
Family Medicine, [facility]  [address]  [place] [address]  [phone]      Patient Name:  Gwendolyn Crawford  MRN:  [id number]  DOB:  1955-10-11    Date of Service:  10/30/2021    Chief Complaint:   Chief Complaint   Patient presents with    Check Up       Subjective:  Gwendolyn Crawford is a 66 y.o. female who returns for a f/u apt. She states that she has been eating garden produce including corn and has gained weight. Her BP and sugars have been trending high. She has had a slight headache in her forehead.     Past Medical History:  Past Medical History:   Diagnosis Date    Allergic rhinitis     Anxiety     Arthritis     Depression     Diabetes mellitus, type 2 (CMS HCC)     Gout     Headache     Hypercholesterolemia     Hypertension     Hypothyroid     Hypothyroidism     Insomnia     PONV (postoperative nausea and vomiting)     RLS (restless legs syndrome)     Vitamin deficiency     Wears glasses             Past Surgical History:  Past Surgical History:   Procedure Laterality Date    COLONOSCOPY      ESOPHAGOGASTRODUODENOSCOPY      HX BREAST BIOPSY Left     HX BENIGN BREAST BX UNSURE WHICH SIDE    HX CHOLECYSTECTOMY      HX DENTAL EXTRACTION              Review of Systems:  Review of Systems   Constitutional: Negative for fever.   HENT: Negative for sore throat.    Eyes: Negative for blurred vision.   Respiratory: Negative for cough and shortness of breath.    Cardiovascular: Negative for chest pain.   Gastrointestinal: Negative for abdominal pain, nausea and vomiting.   Genitourinary: Negative for dysuria.   Musculoskeletal: Negative for falls.   Skin: Negative for rash.   Neurological: Negative for syncope.         Current Outpatient Medications   Medication Sig    acetaminophen (TYLENOL) 325 mg Oral Tablet Take 1 Tablet (325 mg total) by mouth Every 4 hours as needed for Pain    allopurinoL (ZYLOPRIM) 100 mg Oral Tablet Take 1 Tablet (100 mg total) by mouth Once a day for 180 days  TAKE ONE TABLET BY MOUTH TWO TIMES A DAY Indications: treatment to prevent acute gout attack    ascorbic acid (VITAMIN C) 1,000 mg Oral Tablet Take 1 Tablet (1,000 mg total) by mouth Once a day    aspirin (ECOTRIN) 81 mg Oral Tablet, Delayed Release (E.C.) Take 1 Tablet (81 mg total) by mouth Once a day Pt reports Taking once weekly    cholecalciferol, vitamin D3, 25 mcg (1,000 unit) Oral Tablet Take 1 Tablet (1,000 Units total) by mouth Once a day    cinnamon bark (CINNAMON ORAL) Take 1,000 mg by mouth bid    cyanocobalamin (VITAMIN B 12) 1,000 mcg Oral Tablet Take 1 Tablet (1,000 mcg total) by mouth Once a day    DULoxetine (CYMBALTA DR) 20 mg Oral Capsule, Delayed Release(E.C.) Take 1 Capsule (20 mg total) by mouth Once a day    gabapentin (NEURONTIN)  600 mg Oral Tablet Take 1 Tablet (600 mg total) by mouth Three times a day    glimepiride (AMARYL) 1 mg Oral Tablet Take 1 Tablet (1 mg total) by mouth Every morning with breakfast (Patient not taking: Reported on 10/30/2021)    levothyroxine (SYNTHROID) 100 mcg Oral Tablet TAKE ONE TABLET BY MOUTH EVERY MORNING DAILY    lisinopriL (PRINIVIL) 10 mg Oral Tablet Take 1.5 Tablets (15 mg total) by mouth Once a day for blood pressure Indications: high blood pressure, 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    loratadine (CLARITIN) 10 mg Oral Tablet Take 1 Tablet (10 mg total) by mouth Once a day    meclizine (ANTIVERT) 25 mg Oral Tablet Take 1 Tablet (25 mg total) by mouth Every 8 hours as needed    metFORMIN (GLUCOPHAGE) 500 mg Oral Tablet TAKE ONE TABLET BY MOUTH TWO TIMES A DAY WITH FOOD FOR BLOOD SUGAR    naproxen Sodium (ALEVE) 220 mg Oral Tablet Take 1 Tablet (220 mg total) by mouth Every 12 hours as needed    triamcinolone acetonide (ARISTOCORT) 0.5 % Cream     turmeric (CURCUMIN N/A) 1 Capsule Once a day 500mg        Allergies   Allergen Reactions    Zocor [Simvastatin] Myalgia    Crestor [Rosuvastatin]  Other Adverse Reaction (Add comment) and Myalgia     unknown    Lipitor  [Atorvastatin] Myalgia       Objective:    BP (!) 144/92 (Site: Right, Patient Position: Sitting)   Pulse 66   Temp 36.5 C (97.7 F)   Resp 20   Ht 1.626 m (5\' 4" )   Wt 90.4 kg (199 lb 6.4 oz)   SpO2 98%   BMI 34.23 kg/m       Body mass index is 34.23 kg/m.     Physical Exam:  Constitutional: she is oriented to person, place, and time and obese and in no distress.   HENT: right tm obscured by cerumen and left tm clear. No erythema or exudate.   Head: Normocephalic.   Eyes: Pupils are equal, round, and reactive to light. Conjunctivae and EOM are normal. Right eye exhibits no discharge. Left eye exhibits no discharge.   Neck: Normal range of motion. Neck supple. No thyromegaly present.   Cardiovascular: Normal rate, regular rhythm, normal heart sounds and intact distal pulses. Exam reveals no friction rub.   No murmur heard.  Pulmonary/Chest: Breath sounds normal. No respiratory distress. she  has no wheezes. she  has no rales.   Musculoskeletal:         General: No tenderness or edema.   Lymphadenopathy:     she  has no cervical adenopathy.   Neurological she  is alert and oriented to person, place, and time.   Skin: Skin is warm and dry.   Psychiatric: Affect normal.    Diabetic foot exam:  No edema bilaterally. No ulcerations or lesions bilaterally. Pulses normal bilaterally.       Data reviewed:  A1C: 7.5  A1C Date: 96/78/9381  BASIC METABOLIC PANEL  Lab Results   Component Value Date    SODIUM 141 06/28/2021    POTASSIUM 4.1 06/28/2021    CHLORIDE 104 06/28/2021    CO2 24 06/28/2021    ANIONGAP 13 06/28/2021    BUN 9 06/28/2021    CREATININE 0.86 06/28/2021    BUNCRRATIO 10 06/28/2021    GFR 74 06/28/2021    CALCIUM 10.2 06/28/2021    GLUCOSENF  128 (H) 06/28/2021         Recent Results (from the past 59563 hour(s))   MAMMO BILATERAL SCREENING-ADDL VIEWS/BREAST US AS REQ BY RAD    Collection Time: 01/05/21 11:13 AM    Narrative    Computer aided detection (CAD) technology has been applied to the standard    (CC and MLO) images.    3D tomosynthesis and 2D images were acquired and reviewed    FILMS COMPARED:  Compared to: 01/04/2020 MAMMO BILATERAL SCREENING-ADDL VIEWS/BREAST US AS   REQ BY RAD, 10/27/2018 MAMMO BILATERAL SCREENING-ADDL VIEWS/BREAST US AS   REQ BY RAD, and 10/24/2017 MAMMO BILAT SCREENING WO TOMO-ADDL VIEWS/BREAST   US AS REQ BY RAD    HISTORY:  Procedure: MAMMO BILATERAL SCREENING W TOMO-ADDL VIEWS/BREAST US AS REQ BY   RAD  Reason for exam: Breast cancer screening by mammogram      FINDINGS:  The breasts are almost entirely fatty.    Bilateral  There is no evidence of suspicious masses, calcifications, or other   abnormal findings.      Impression    Follow up mammogram in 1 year is recommended for both breasts.    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 She was given the flu vaccine today. She is to f/u in 6-8 weeks. She refused a COVID booster. Discussed with the patient her diet,exercise and weight. She is to let me know what her BP is. If the slight headache over her sinuses is persistent or worse she needs to f/u earlier than 6 weeks. She understands. She had a head ct 10/22 and had not acute abnormality. She has an eye apt coming up.        Treatment plan was discussed with patient and shared decision making employed. The patient was encouraged to call with any additional questions  or concerns.     Discussed with patient effects and side effects of medications. Medication safety was discussed. A good faith effort was made to reconcile the patient's medications.   Orders Placed This Encounter    Fluad ( 65+ ) Flu Vaccine,0.5 mL IM (Admin)    Uric Acid    C Reactive Protein (CRP) Inflammation    POCT HGB A1C    DULoxetine (CYMBALTA DR) 20 mg Oral Capsule, Delayed Release(E.C.)    lisinopriL (PRINIVIL) 10 mg Oral Tablet    allopurinoL (ZYLOPRIM) 100 mg Oral Tablet         Follow up:  No follow-ups on file.    This note was partially generated using MModal Fluency Direct system, and there may be some incorrect words, spellings, and punctuation that were not noted in checking the note before saving.  Leonie Man, MD  10/29/2021, 17:16

## 2021-10-30 ENCOUNTER — Other Ambulatory Visit: Payer: Self-pay

## 2021-10-30 ENCOUNTER — Ambulatory Visit: Payer: Medicare Other | Attending: FAMILY MEDICINE | Admitting: FAMILY MEDICINE

## 2021-10-30 ENCOUNTER — Encounter (INDEPENDENT_AMBULATORY_CARE_PROVIDER_SITE_OTHER): Payer: Self-pay | Admitting: FAMILY MEDICINE

## 2021-10-30 ENCOUNTER — Telehealth (INDEPENDENT_AMBULATORY_CARE_PROVIDER_SITE_OTHER): Payer: Self-pay | Admitting: FAMILY MEDICINE

## 2021-10-30 VITALS — BP 144/92 | HR 66 | Temp 97.7°F | Resp 20 | Ht 64.0 in | Wt 199.4 lb

## 2021-10-30 DIAGNOSIS — I10 Essential (primary) hypertension: Secondary | ICD-10-CM | POA: Insufficient documentation

## 2021-10-30 DIAGNOSIS — E039 Hypothyroidism, unspecified: Secondary | ICD-10-CM | POA: Insufficient documentation

## 2021-10-30 DIAGNOSIS — E790 Hyperuricemia without signs of inflammatory arthritis and tophaceous disease: Secondary | ICD-10-CM | POA: Insufficient documentation

## 2021-10-30 DIAGNOSIS — Z23 Encounter for immunization: Secondary | ICD-10-CM | POA: Insufficient documentation

## 2021-10-30 DIAGNOSIS — F325 Major depressive disorder, single episode, in full remission: Secondary | ICD-10-CM | POA: Insufficient documentation

## 2021-10-30 DIAGNOSIS — E782 Mixed hyperlipidemia: Secondary | ICD-10-CM | POA: Insufficient documentation

## 2021-10-30 DIAGNOSIS — E119 Type 2 diabetes mellitus without complications: Secondary | ICD-10-CM | POA: Insufficient documentation

## 2021-10-30 LAB — POCT HGB A1C: POCT HGB A1C: 8.2 % — AB (ref 4–6)

## 2021-10-30 MED ORDER — ALLOPURINOL 100 MG TABLET
100.0000 mg | ORAL_TABLET | Freq: Every day | ORAL | Status: DC
Start: 2021-10-30 — End: 2022-02-12

## 2021-10-30 MED ORDER — LISINOPRIL 10 MG TABLET
15.0000 mg | ORAL_TABLET | Freq: Every day | ORAL | 1 refills | Status: DC
Start: 2021-10-30 — End: 2021-11-17

## 2021-10-30 MED ORDER — DULOXETINE 20 MG CAPSULE,DELAYED RELEASE
20.0000 mg | DELAYED_RELEASE_CAPSULE | Freq: Every day | ORAL | 1 refills | Status: DC
Start: 2021-10-30 — End: 2022-05-01

## 2021-10-30 NOTE — Telephone Encounter (Signed)
Tried to reach pt to discuss - unable. Left vm asking she return call about lab results. Sovereign Ramiro, RN

## 2021-10-30 NOTE — Telephone Encounter (Signed)
Please let this patient know the results of her A1c 8.2. Check to see if she has any problems with the increase in Metformin to 1091m bid in the past? Also she had stopped her Amaryl 132mand did she have any problems with it? Will need to increase her meds since her A1c was elevated.

## 2021-10-30 NOTE — Telephone Encounter (Signed)
Have the patient increase her Metformin to 1015m in the am and 5066min the pm and keep track of her sugars and BP for 2 weeks and let me know. Will increase her medication stepwise as tolerated.

## 2021-10-30 NOTE — Telephone Encounter (Signed)
Left message for patient to return call  Thamas Jaegers, MA

## 2021-10-30 NOTE — Nursing Note (Signed)
Pt tried to leave a UA for the lab, but couldn't leave enough I canceled the order and told the pt the next time she was in town or coming towards the clinic to stop by and leave one. Berton Mount, [place]

## 2021-10-30 NOTE — Nursing Note (Signed)
Pt here for check up. She reports her BP has been running high at home. She has a list of home BP readings with her. She has a headache today. Wants flu vaccine today.

## 2021-10-30 NOTE — Telephone Encounter (Signed)
Dr. Sandra Cockayne: spoke with pt. She states she remembers taking more Metformin than she's currently taking and doesn't remember any problems with it in particular, but it's hard to remember per pt. Also, she thinks you'd told her to stop her Amaryl because her sugar was trending too low for a period of time. She didn't have trouble with the Amaryl that she remembers. Also, pt wants to know if it's ok for her to take Turmeric/Curcumin supplement 500 strength daily. She just wanted to double check with you. She knows we'll call with your advise. Thanks. Jettie Pagan, RN

## 2021-10-31 ENCOUNTER — Ambulatory Visit: Payer: Medicare Other | Attending: FAMILY MEDICINE

## 2021-10-31 DIAGNOSIS — N390 Urinary tract infection, site not specified: Secondary | ICD-10-CM | POA: Insufficient documentation

## 2021-10-31 LAB — URINALYSIS, MACROSCOPIC
BILIRUBIN: NEGATIVE mg/dL
BLOOD: NEGATIVE mg/dL
GLUCOSE: NEGATIVE mg/dL
KETONES: NEGATIVE mg/dL
NITRITE: NEGATIVE
PH: 7 (ref 5.0–8.5)
PROTEIN: NEGATIVE mg/dL
SPECIFIC GRAVITY: 1.01 (ref 1.005–1.030)
UROBILINOGEN: 0.2 mg/dL

## 2021-10-31 LAB — URINALYSIS, MICROSCOPIC

## 2021-10-31 NOTE — Nursing Note (Signed)
Pt dropped off a UA for lab. Pt states she is still having signs of a UTI that it's not getting any better  [place], [place]

## 2021-10-31 NOTE — Telephone Encounter (Signed)
Patient made aware and states understanding and agreement. Encouraged to communicate with staff any questions or concerns regarding plan of care.

## 2021-10-31 NOTE — Progress Notes (Signed)
Patient wanted to add to her medications some dietary supplements  Adult gummy Vit D 2,000 IU  Bone and Immune Health    Tumeric -Curcumin w/Ginger Powder 500mg 

## 2021-11-01 LAB — URINE CULTURE: URINE CULTURE: 50000 — AB

## 2021-11-02 ENCOUNTER — Telehealth (INDEPENDENT_AMBULATORY_CARE_PROVIDER_SITE_OTHER): Payer: Self-pay | Admitting: FAMILY MEDICINE

## 2021-11-02 NOTE — Telephone Encounter (Signed)
Please let the patient know that her urine culture did not grow a pathologic organism.

## 2021-11-02 NOTE — Telephone Encounter (Signed)
Left detailed message informing patient of physician's orders. Encouraged to communicate with staff any questions or concerns.

## 2021-11-13 ENCOUNTER — Other Ambulatory Visit (INDEPENDENT_AMBULATORY_CARE_PROVIDER_SITE_OTHER): Payer: Self-pay | Admitting: FAMILY MEDICINE

## 2021-11-13 MED ORDER — GABAPENTIN 600 MG TABLET
600.0000 mg | ORAL_TABLET | Freq: Three times a day (TID) | ORAL | 0 refills | Status: DC
Start: 2021-11-13 — End: 2021-12-12

## 2021-11-15 ENCOUNTER — Telehealth (INDEPENDENT_AMBULATORY_CARE_PROVIDER_SITE_OTHER): Payer: Self-pay | Admitting: FAMILY MEDICINE

## 2021-11-15 NOTE — Telephone Encounter (Signed)
Patient will have the home COVID test done.  Probably tomorrow. Explained that will determine what treatment will be used  Thamas Jaegers, MA

## 2021-11-15 NOTE — Telephone Encounter (Signed)
Dr. Sandra Cockayne: pt called and states last night she started coughing. Reports production of white sputum, feels feverish but hasn't checked temperature, has headache. Denies nausea/vomiting/diarrhea, body aches, loss of taste or smells, or head congestion.     Hasn't had Covid test, doesn't have home Covid test to check herself, and hasn't been exposed to anyone with Covid that she knows of.     Has taken Coricidin and cough drops. Encouraged her to stay hydrated.     Uses [facility] and allergies as listed in chart. Please advise. Thanks. Jettie Pagan, RN

## 2021-11-15 NOTE — Telephone Encounter (Signed)
Have her get a home COVID test and if positive call back to be placed on medication. Her symptoms are similar to COVID. There is a 5 day window to get treatment. Remind her if she has any problems including shortness of breath, fever or chest pain she needs to go to the ER.

## 2021-11-16 ENCOUNTER — Telehealth (INDEPENDENT_AMBULATORY_CARE_PROVIDER_SITE_OTHER): Payer: Self-pay | Admitting: FAMILY MEDICINE

## 2021-11-16 NOTE — Telephone Encounter (Signed)
About yesterdays's message.  Patient did a COVID test at your request this morning and it came back negative.  Thamas Jaegers, MA

## 2021-11-16 NOTE — Telephone Encounter (Signed)
Check to see how she is doing and if her symptoms are improved, same or worse. If any problems she needs to be seen.

## 2021-11-16 NOTE — Telephone Encounter (Signed)
Please check to see if this patient is taking Lisinopril 15mg  po daily. If yes, then have her increase her dose of Lisinopril to 20mg  po daily. Check to see if she needs to have this sent in to her pharmacy. Will send it in as Lisinopril 20mg  po daily. Have her continue to keep track of her BP and sugars for 2 weeks and let me know. Check to see how she is taking her current meds for her sugar.

## 2021-11-17 MED ORDER — METFORMIN 500 MG TABLET
ORAL_TABLET | ORAL | 1 refills | Status: DC
Start: 2021-11-17 — End: 2021-11-20

## 2021-11-17 MED ORDER — LISINOPRIL 20 MG TABLET
20.0000 mg | ORAL_TABLET | Freq: Every day | ORAL | 1 refills | Status: DC
Start: 2021-11-17 — End: 2021-12-11

## 2021-11-17 MED ORDER — CEPHALEXIN 500 MG CAPSULE
500.0000 mg | ORAL_CAPSULE | Freq: Two times a day (BID) | ORAL | 0 refills | Status: DC
Start: 2021-11-17 — End: 2021-12-11

## 2021-11-17 NOTE — Telephone Encounter (Signed)
Called patient and she stated that she currently takes 15 mg PO daily of Lisinopril. Educated her to increase to 20 mg PO daily and that new prescription would be sent in. [facility] [place]. Educated her to keep track of BP and sugars for the next 2 weeks and inform us of results.     Patient stated that she needs refill on Metformin - takes one tablet PO BID.     Also states that she has "bronchitis". He has a cough with yellow mucous, nasal pressure and fever. Would like something for this.

## 2021-11-17 NOTE — Telephone Encounter (Signed)
Please let the know that Lisinopril 20mg  po daily was sent to her pharmacy. Also Metformin 500mg  po bid was sent in. Check to see if her dose of Metformin was increased in the am to 1000mg  po and 500mg  in the pm and if she has been taking this increased dose and if her sugars have done better since her A1c was higher. Also Keflex was sent to her pharmacy of record. I do not see an allergy to it. If she is not allergic to it have her pick it up and if any problems with it stop it and let me know. Go to the ER if any problems.

## 2021-11-20 ENCOUNTER — Other Ambulatory Visit (INDEPENDENT_AMBULATORY_CARE_PROVIDER_SITE_OTHER): Payer: Self-pay | Admitting: FAMILY MEDICINE

## 2021-11-20 MED ORDER — METFORMIN 500 MG TABLET
ORAL_TABLET | ORAL | 1 refills | Status: DC
Start: 2021-11-20 — End: 2022-05-14

## 2021-11-20 NOTE — Telephone Encounter (Signed)
Called patient previously on separate encounter.  "Called patient and informed her that Lisinopril and Metformin were called in to pharmacy. She stated that her dose of Metformin was increased to 1,000 mg in AM and 500 mg at PM. States her sugars have not been doing any better. She stated that she is still not feeling better d/t the bronchitis. Informed her that Keflex was sent in to pharmacy and to let us know if she has an allergic reaction to this medication and to stop it if she does and go to the ED if any problems arise. Showed understanding. Educated her that her being sick could cause some increased blood sugars and that I would send this message back to Dr. Sandra Cockayne and see what she says and call her back with any new orders. Showed understanding and is in compliance." - Elam Dutch, LPN

## 2021-11-20 NOTE — Telephone Encounter (Signed)
Called patient and informed her that Lisinopril and Metformin were called in to pharmacy. She stated that her dose of Metformin was increased to 1,000 mg in AM and 500 mg at PM. States her sugars have not been doing any better. She stated that she is still not feeling better d/t the bronchitis. Informed her that Keflex was sent in to pharmacy and to let us know if she has an allergic reaction to this medication and to stop it if she does and go to the ED if any problems arise. Showed understanding. Educated her that her being sick could cause some increased blood sugars and that I would send this message back to Dr. Sandra Cockayne and see what she says and call her back with any new orders. Showed understanding and is in compliance.

## 2021-11-20 NOTE — Telephone Encounter (Signed)
Called to inform pt. Pt states she was already called.    Felix Pacini, [place]

## 2021-11-22 IMAGING — MR MRA HEAD (BRAIN) ANGIO WITHOUT IV CONTRAST
5 series · 43 of 48 positions shown · IV contrast (agent unspecified)
Comparison: None.

FINAL REPORT:
MRA HEAD without contrast:
HISTORY: Mild cognitive impairment of uncertain or unknown etiology
TECHNIQUE: Routine noncontrast 3-D time-of-flight MR angiogram of the intracranial arteries performed.

[Series 1: survey · sagittal · 1.6mm · 1.62mm/px · 19 of 126 slices shown]
[im 1/126]
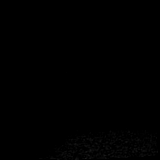
[im 7/126]
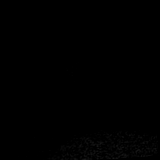
[im 14/126]
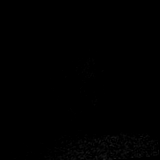
[im 21/126]
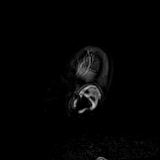
[im 28/126]
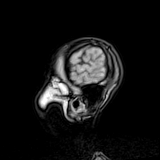
[im 35/126]
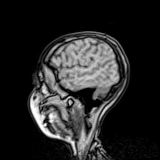
[im 42/126]
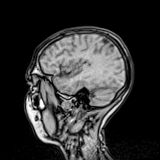
[im 49/126]
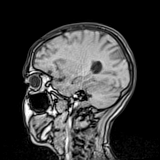
[im 56/126]
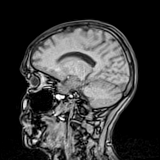
[im 63/126]
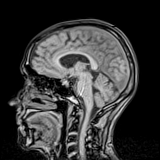
[im 70/126]
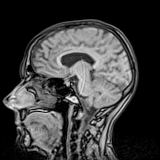
[im 77/126]
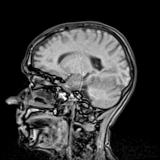
[im 84/126]
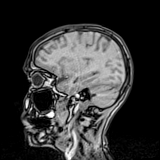
[im 91/126]
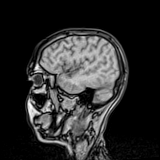
[im 98/126]
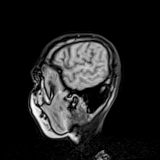
[im 105/126]
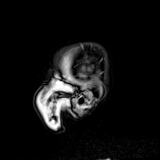
[im 112/126]
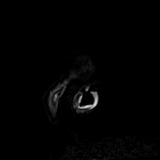
[im 119/126]
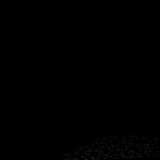
[im 126/126]
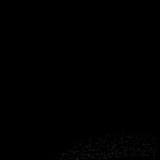

[Series 2: survey_mpr_sag · sagittal · 1.6mm · 1.60mm/px · 1 of 5 slices shown]
[im 1/5]
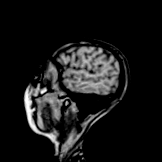

[Series 3: survey_mpr_cor · coronal · 1.6mm · 1.60mm/px · 1 of 3 slices shown]
[im 1/3]
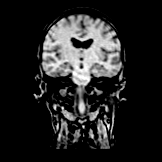

[Series 4: survey_mpr_tra · axial · 1.6mm · 1.60mm/px · 1 of 3 slices shown]
[im 1/3]
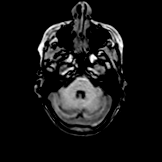

[Series 6: MRA · axial · 0.5mm · 0.39mm/px · z∈[-48,+32]mm · 21 of 168 slices shown]
[im 1/168]
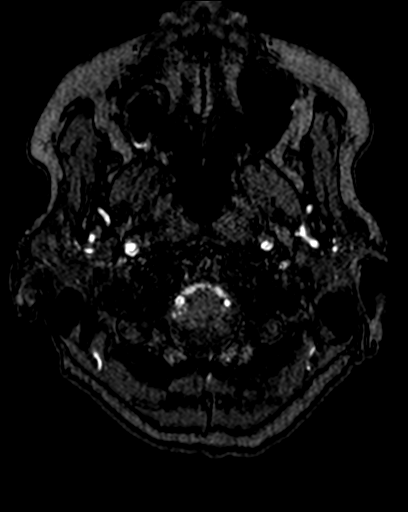
[im 7/168]
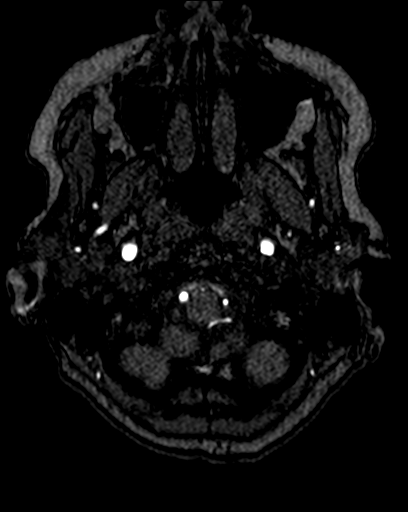
[im 14/168]
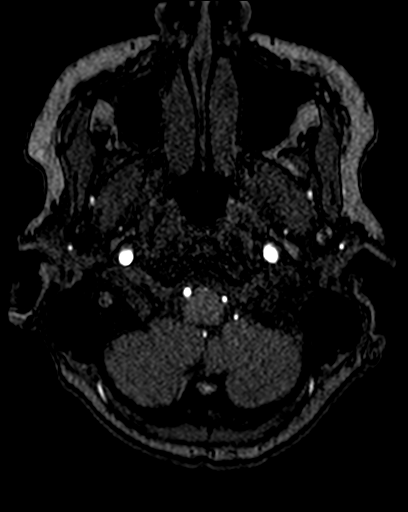
[im 21/168]
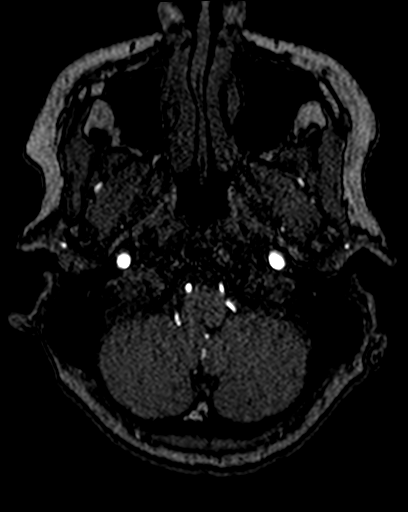
[im 27/168]
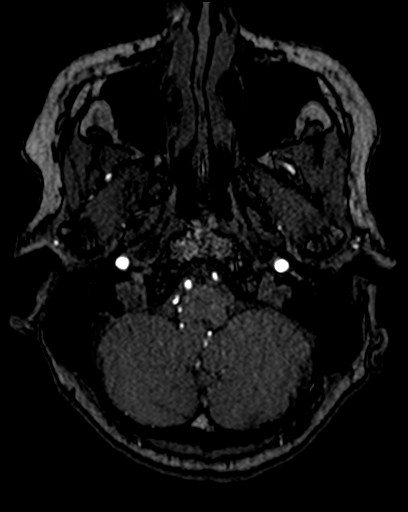
[im 34/168]
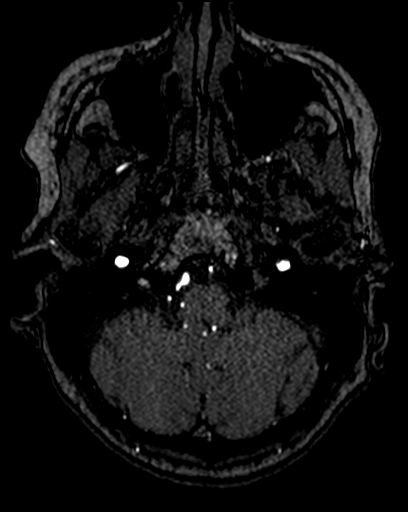
[im 41/168]
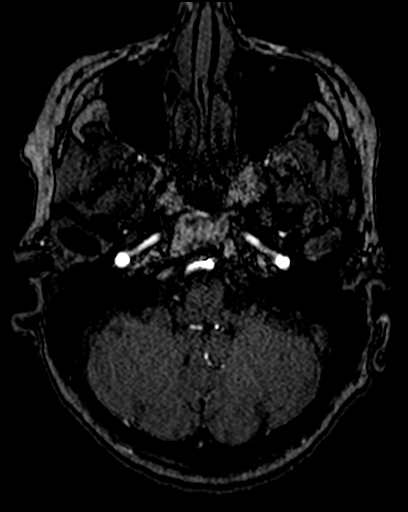
[im 47/168]
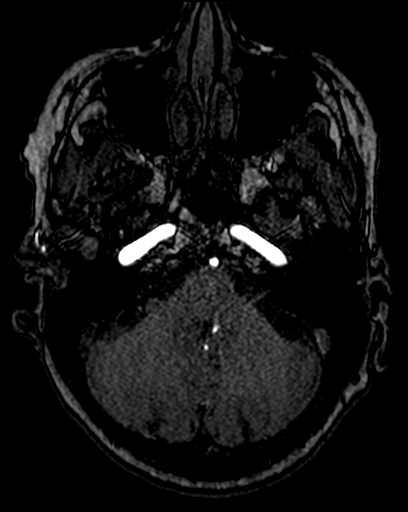
[im 54/168]
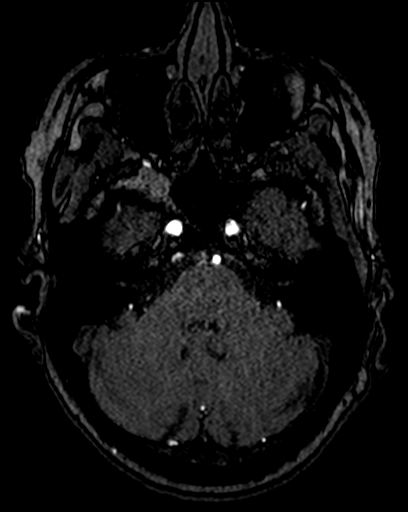
[im 61/168]
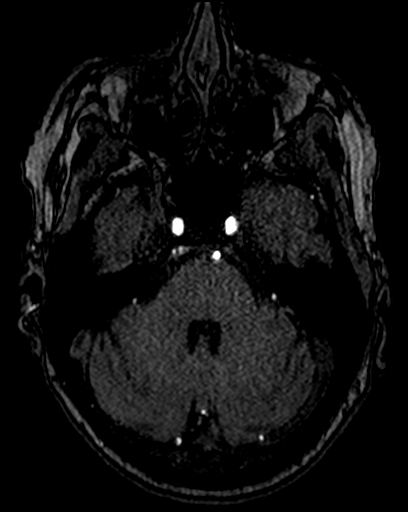
[im 67/168]
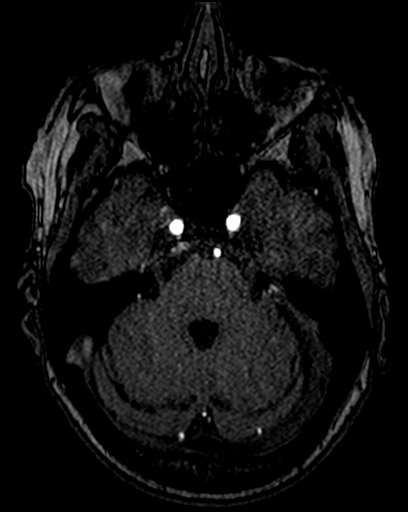
[im 74/168]
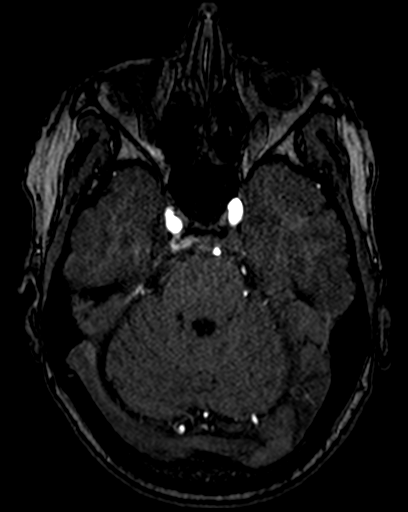
[im 81/168]
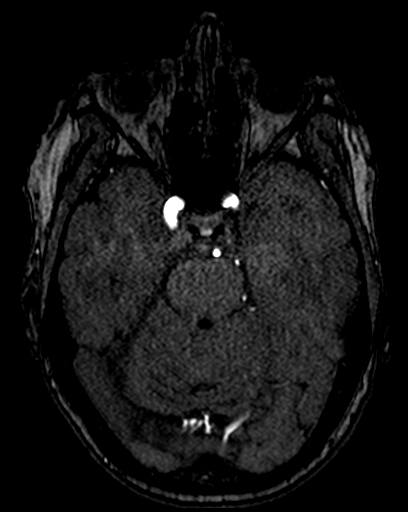
[im 87/168]
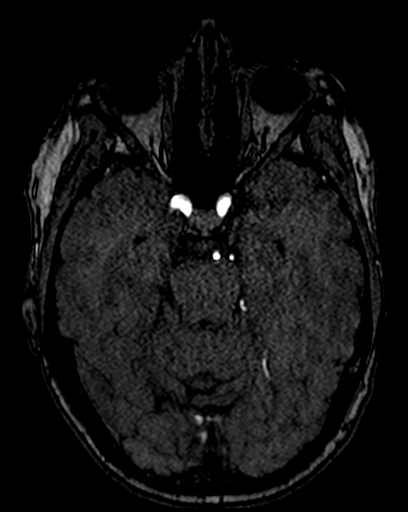
[im 94/168]
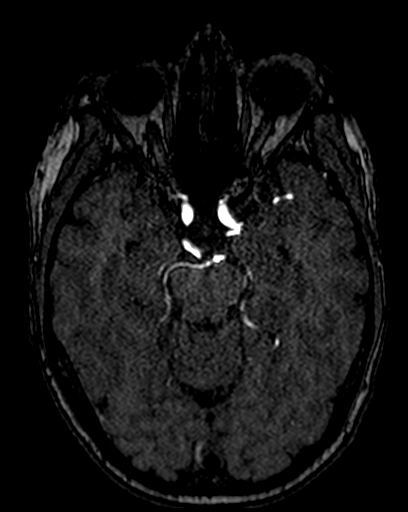
[im 101/168]
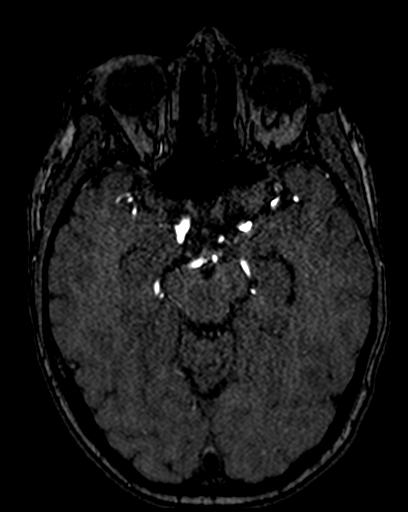
[im 107/168]
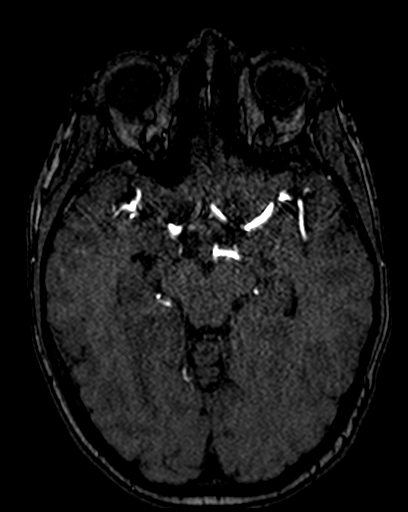
[im 114/168]
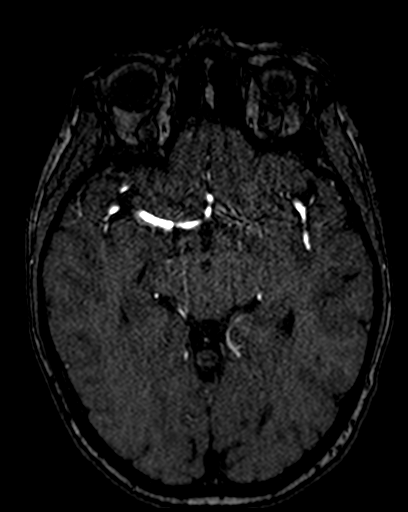
[im 121/168]
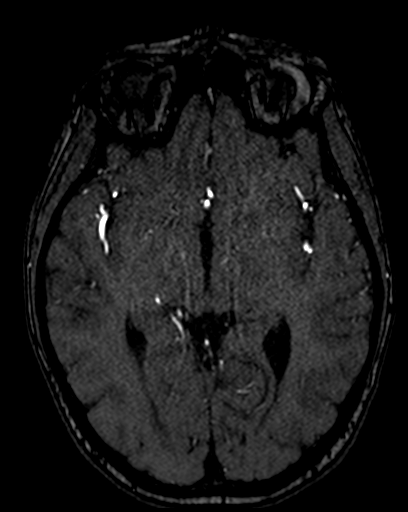
[im 141/168]
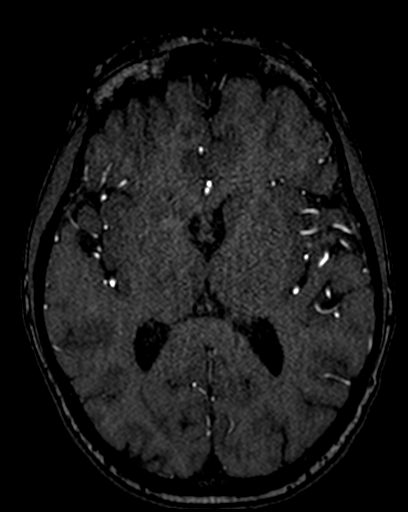
[im 161/168]
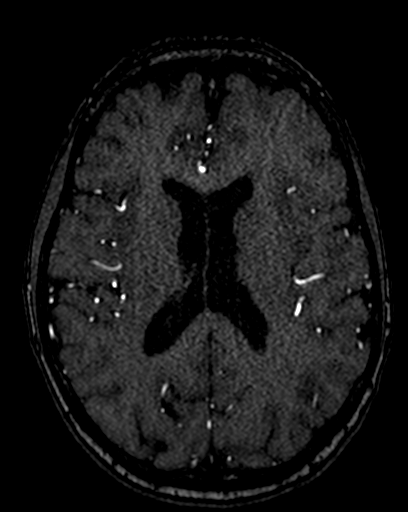

[43 of 48 positions shown; findings below may reference images not displayed]

FINDINGS: There is no aneurysm identified.
Normal flow signal is demonstrated within the intracranial ICAs, ACAs, MCAs, and their major proximal branches.
Normal flow signal is demonstrated within the vertebral arteries, basilar artery and posterior cerebral arteries.
IMPRESSION: Normal MRA head.

## 2021-11-22 IMAGING — MR MRI BRAIN WITHOUT CONTRAST
12 series · 48 of 48 positions shown · non-contrast
Comparison: None.

FINAL REPORT:
EXAM: MRI of the brain without contrast.
HISTORY: Mild cognitive impairment of uncertain or unknown etiology
TECHNIQUE: Multisequence multiplanar images of the brain were obtained without contrast.

[Series 2: survey · sagittal · 1.6mm · 1.62mm/px · 17 of 126 slices shown]
[im 1/126]
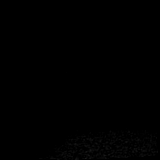
[im 8/126]
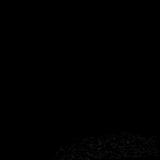
[im 16/126]
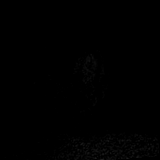
[im 24/126]
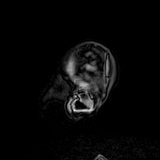
[im 32/126]
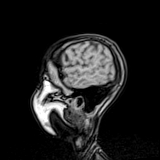
[im 40/126]
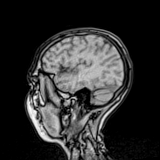
[im 47/126]
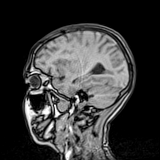
[im 55/126]
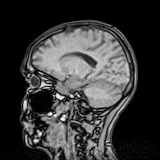
[im 63/126]
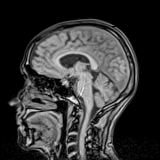
[im 71/126]
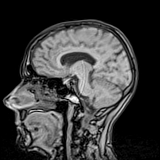
[im 79/126]
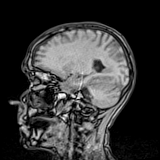
[im 86/126]
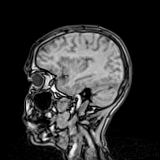
[im 94/126]
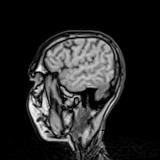
[im 102/126]
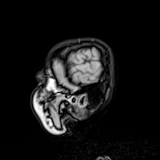
[im 110/126]
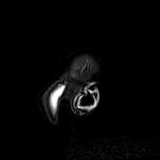
[im 118/126]
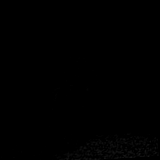
[im 126/126]
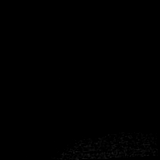

[Series 3: survey_mpr_sag · sagittal · 1.6mm · 1.60mm/px · 1 of 5 slices shown]
[im 1/5]
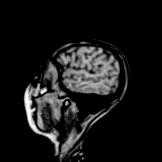

[Series 4: survey_mpr_cor · coronal · 1.6mm · 1.60mm/px · 1 of 3 slices shown]
[im 1/3]
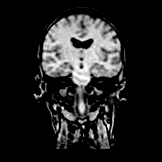

[Series 5: survey_mpr_tra · axial · 1.6mm · 1.60mm/px · 1 of 3 slices shown]
[im 1/3]
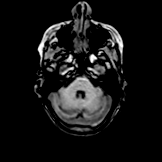

[Series 11: T1 · sagittal · 5.0mm · 0.72mm/px · 2 of 25 slices shown (1 of 2)]
[im 1/25]
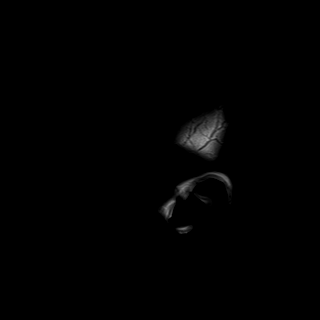
[im 25/25]
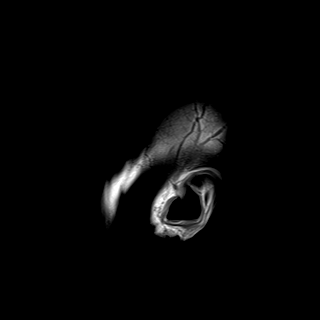

[Series 12: ax dwi_tracew · axial · 4.0mm · 0.60mm/px · z∈[-58,+82]mm · 3 of 26 slices shown]
[im 1/26]
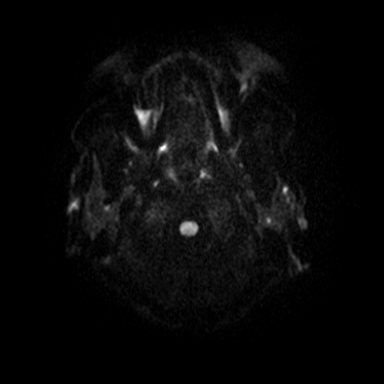
[im 13/26]
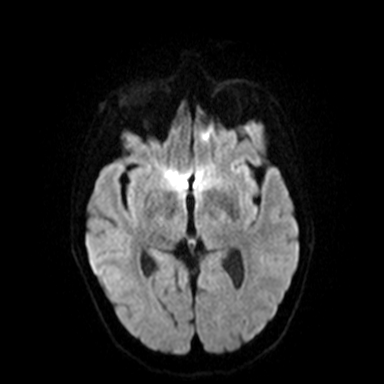
[im 26/26]
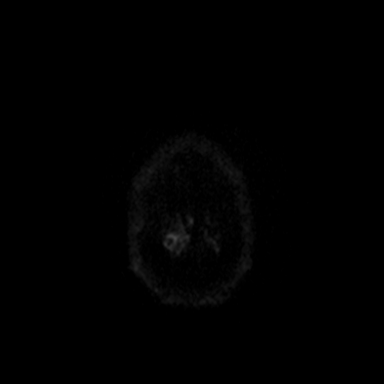

[Series 13: ax dwi_adc · axial · 4.0mm · 0.60mm/px · z∈[-58,+82]mm · 3 of 26 slices shown]
[im 1/26]
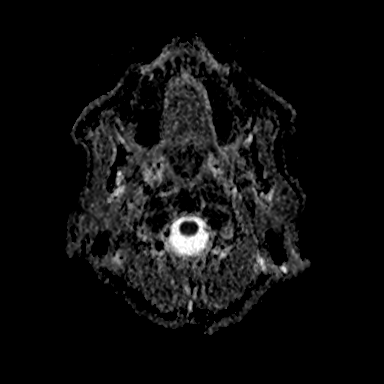
[im 13/26]
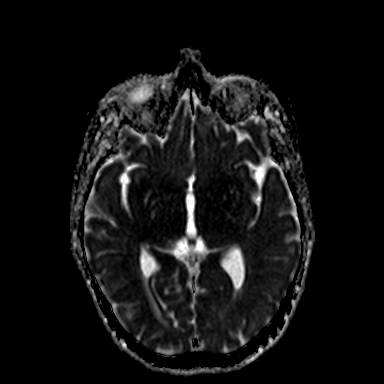
[im 26/26]
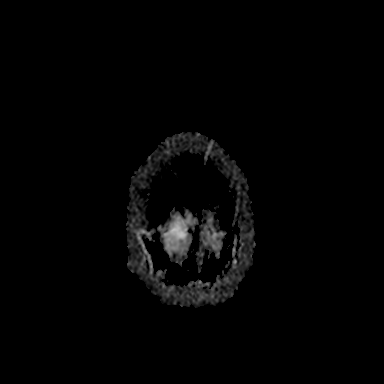

[Series 14: ax dwi_exp · axial · 4.0mm · 0.60mm/px · z∈[-58,+82]mm · 4 of 26 slices shown]
[im 1/26]
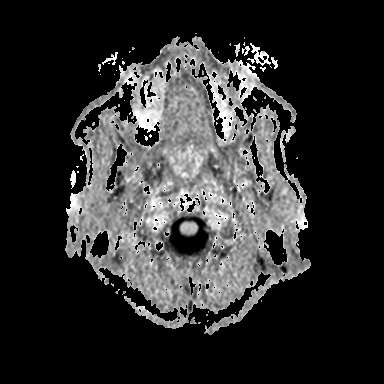
[im 9/26]
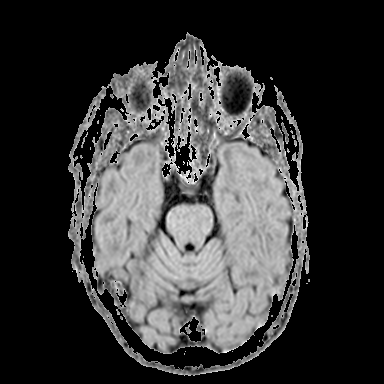
[im 17/26]
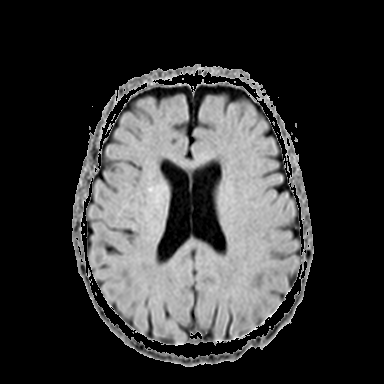
[im 26/26]
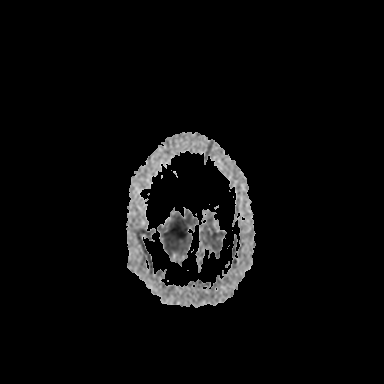

[Series 15: T2 · axial · 4.0mm · 0.51mm/px · z∈[-57,+83]mm · 4 of 26 slices shown]
[im 1/26]
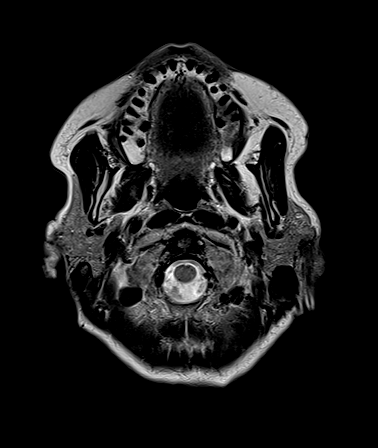
[im 9/26]
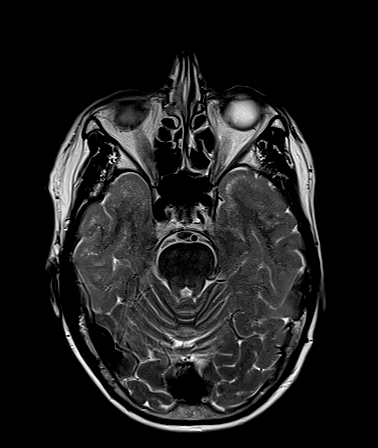
[im 17/26]
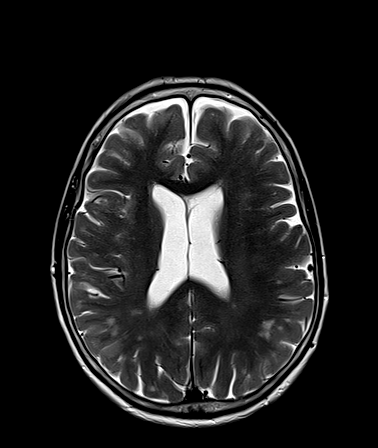
[im 26/26]
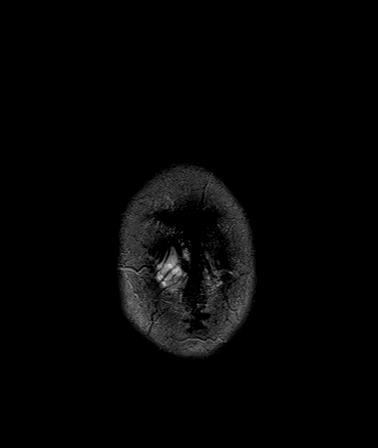

[Series 16: T1 · axial · 4.0mm · 0.72mm/px · z∈[-57,+83]mm · 4 of 26 slices shown (2 of 2)]
[im 1/26]
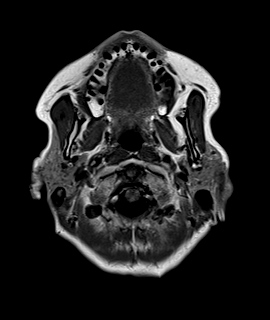
[im 9/26]
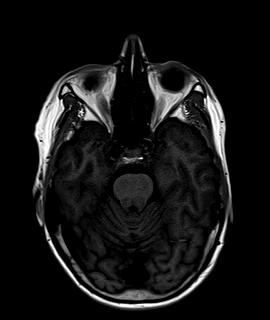
[im 17/26]
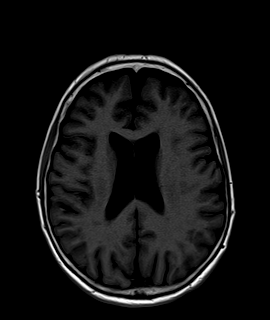
[im 26/26]
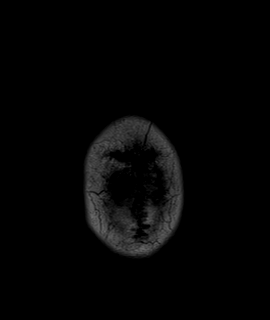

[Series 17: FLAIR · axial · 4.0mm · 0.72mm/px · z∈[-57,+83]mm · 4 of 26 slices shown]
[im 1/26]
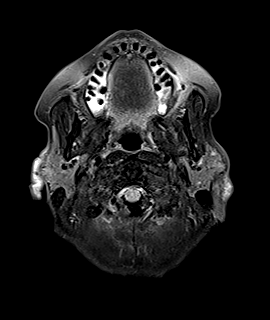
[im 9/26]
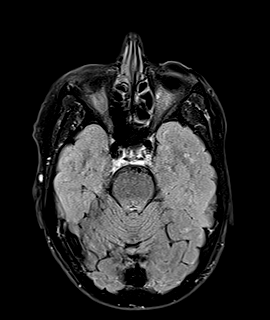
[im 17/26]
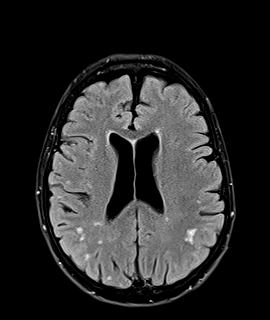
[im 26/26]
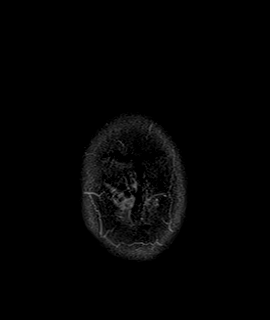

[Series 18: GRE · axial · 4.0mm · 0.45mm/px · z∈[-57,+83]mm · 4 of 26 slices shown]
[im 1/26]
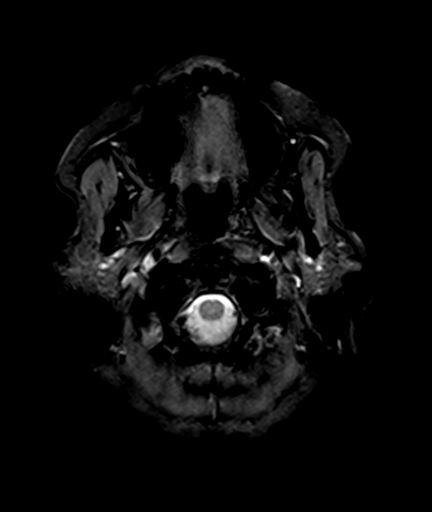
[im 9/26]
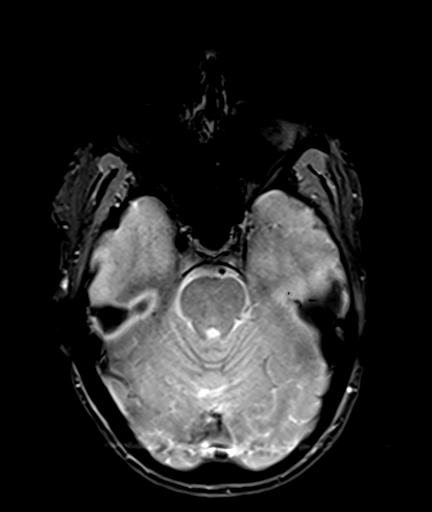
[im 17/26]
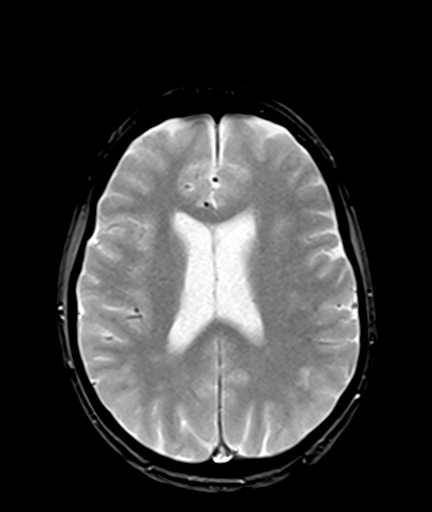
[im 26/26]
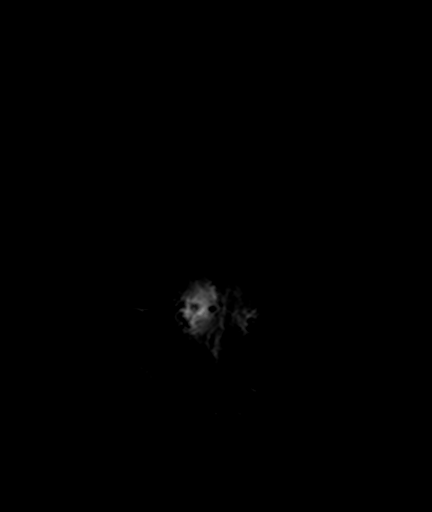

[48 of 48 positions shown; findings below may reference images not displayed]

FINDINGS: No midline shift. Basal cisterns are patent.  Ventricles are normal and symmetric.
No restricted diffusion to suggest acute or subacute infarct..  No extra-axial fluid collections. The pituitary gland measures up to 9 mm. The calvarium, clivus, cervical spine demonstrate normal T1 marrow intensity. No Chiari malformation. The corpus callosum appears well-formed. The native lenses are surgically absent. Multiple T2/FLAIR bright signal intensities seen throughout the juxtacortical white matter of the temporal lobes, parietal lobes, occipital lobes, frontal lobes. No susceptibility artifact or associated restricted diffusion is seen involving these intensities.
Intracranial vascular flow voids are normal.
Orbital contents are unremarkable.
Sinuses and mastoids are clear.
IMPRESSION: Numerous juxtacortical and subcortical T2/FLAIR signal intensities throughout the white matter of the supratentorial and infratentorial brain. Microvascular white matter disease, vasculitides, and demyelinating plaques are within the differential. Would recommend repeat exam utilizing IV contrast for further evaluation.
The pituitary gland appears enlarged for age, measuring up to 9 mm. Would recommend dedicated MRI pituitary protocol with and without contrast for further evaluation.
No acute infarct. No hemorrhage. No midline shift. No hydrocephalus.

## 2021-11-22 IMAGING — MR MRI NECK ANGIO WITH AND WITHOUT IV CONTRAST
2 of 6 series · 7 of 48 positions shown · IV contrast (with contrast)
Comparison: None

FINAL REPORT:
MRA neck with contrast
INDICATION: Mild cognitive impairment of uncertain or unknown etiology
TECHNIQUE: Multisequence multiplanar images were obtained of the neck vessels with 12 cc  MultiHance IV contrast. 3-D reformats were obtained in addition.

[Series 1: survey · axial · 10.0mm · 1.56mm/px · z∈[-269,+142]mm · 3 of 23 slices shown]
[im 1/23]
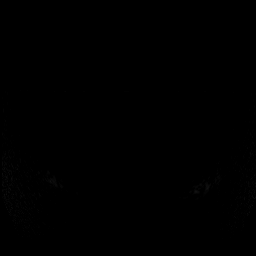
[im 12/23]
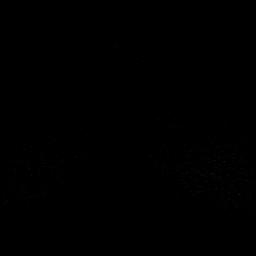
[im 23/23]
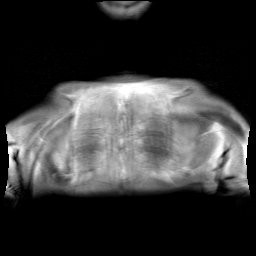

[Series 3: (person_name) · coronal · 3.2mm · 1.17mm/px · 4 of 40 slices shown]
[im 1/40]
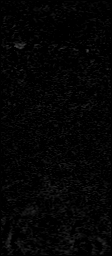
[im 14/40]
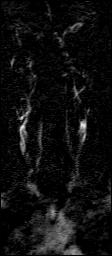
[im 27/40]
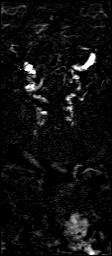
[im 40/40]
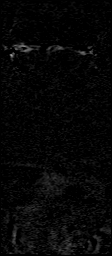

[7 of 48 positions shown; findings below may reference images not displayed]

FINDINGS: The bilateral common carotid arteries are patent. Patent flow is seen within the external carotid artery. The cervical, petrous, cavernous, clinoid ICA appears patent.
Patent flow seen within the intraforaminal, extraforaminal, extradural, intradural portions of the vertebral arteries.
IMPRESSION: Vastly patent intracervical vasculature.
Stenosis measurements, if present, based on NASCET criteria.

## 2021-12-03 ENCOUNTER — Telehealth (INDEPENDENT_AMBULATORY_CARE_PROVIDER_SITE_OTHER): Payer: Self-pay | Admitting: FAMILY MEDICINE

## 2021-12-03 NOTE — Telephone Encounter (Signed)
Please check to see what the patient's current dose of diabetic medicines are.  Check to see if she is taking Amaryl 1 mg p.o. daily if not have her begin with half a tablet a day and see what her sugars do.  If her sugars dropped down below 100 have her stop it.  Also check to see if she is taking lisinopril 20 mg p.o. daily.  She has an appointment coming up 12/11/2021.  Have her keep that and will review treatment options.

## 2021-12-04 MED ORDER — GLIMEPIRIDE 1 MG TABLET
0.5000 mg | ORAL_TABLET | Freq: Every morning | ORAL | 1 refills | Status: DC
Start: 2021-12-04 — End: 2022-12-03

## 2021-12-04 NOTE — Telephone Encounter (Signed)
Pt states she takes metformin 500 mg. 4 total pills daily.    States she hasn't been taking the Amaryl, will need a refill of this... states yes to lisinopril, and gave her appt date and time. States will keep this appt.    Felix Pacini, [place]

## 2021-12-04 NOTE — Addendum Note (Signed)
Addended by: Charlie Pitter on: 12/04/2021 05:57 PM     Modules accepted: Orders

## 2021-12-08 ENCOUNTER — Encounter (INDEPENDENT_AMBULATORY_CARE_PROVIDER_SITE_OTHER): Payer: Self-pay | Admitting: FAMILY MEDICINE

## 2021-12-11 ENCOUNTER — Encounter (INDEPENDENT_AMBULATORY_CARE_PROVIDER_SITE_OTHER): Payer: Self-pay | Admitting: FAMILY MEDICINE

## 2021-12-11 ENCOUNTER — Other Ambulatory Visit: Payer: Self-pay

## 2021-12-11 ENCOUNTER — Ambulatory Visit: Payer: Medicare Other | Attending: FAMILY MEDICINE | Admitting: FAMILY MEDICINE

## 2021-12-11 VITALS — BP 140/70 | HR 76 | Temp 97.3°F | Resp 20 | Ht 64.0 in | Wt 198.2 lb

## 2021-12-11 DIAGNOSIS — E790 Hyperuricemia without signs of inflammatory arthritis and tophaceous disease: Secondary | ICD-10-CM | POA: Insufficient documentation

## 2021-12-11 DIAGNOSIS — F325 Major depressive disorder, single episode, in full remission: Secondary | ICD-10-CM | POA: Insufficient documentation

## 2021-12-11 DIAGNOSIS — R002 Palpitations: Secondary | ICD-10-CM | POA: Insufficient documentation

## 2021-12-11 DIAGNOSIS — E039 Hypothyroidism, unspecified: Secondary | ICD-10-CM | POA: Insufficient documentation

## 2021-12-11 DIAGNOSIS — R42 Dizziness and giddiness: Secondary | ICD-10-CM | POA: Insufficient documentation

## 2021-12-11 DIAGNOSIS — I10 Essential (primary) hypertension: Secondary | ICD-10-CM | POA: Insufficient documentation

## 2021-12-11 DIAGNOSIS — Z7984 Long term (current) use of oral hypoglycemic drugs: Secondary | ICD-10-CM | POA: Insufficient documentation

## 2021-12-11 DIAGNOSIS — E119 Type 2 diabetes mellitus without complications: Secondary | ICD-10-CM | POA: Insufficient documentation

## 2021-12-11 DIAGNOSIS — E782 Mixed hyperlipidemia: Secondary | ICD-10-CM | POA: Insufficient documentation

## 2021-12-11 DIAGNOSIS — G2581 Restless legs syndrome: Secondary | ICD-10-CM | POA: Insufficient documentation

## 2021-12-11 LAB — BASIC METABOLIC PANEL
ANION GAP: 12 mmol/L (ref 4–13)
BUN/CREA RATIO: 16 (ref 6–22)
BUN: 12 mg/dL (ref 8–25)
CALCIUM: 10.1 mg/dL (ref 8.6–10.3)
CHLORIDE: 104 mmol/L (ref 96–111)
CO2 TOTAL: 25 mmol/L (ref 23–31)
CREATININE: 0.76 mg/dL (ref 0.60–1.05)
ESTIMATED GFR - FEMALE: 86 mL/min/BSA (ref >=60)
GLUCOSE: 103 mg/dL (ref 65–125)
POTASSIUM: 4.2 mmol/L (ref 3.5–5.1)
SODIUM: 141 mmol/L (ref 136–145)

## 2021-12-11 LAB — MICROALBUMIN/CREATININE RATIO, URINE, RANDOM
CREATININE RANDOM URINE: 82 mg/dL (ref 50–100)
MICROALBUMIN RANDOM URINE: <0.5 mg/dL

## 2021-12-11 LAB — URIC ACID: URIC ACID: 6.8 mg/dL — ABNORMAL HIGH (ref 2.9–6.3)

## 2021-12-11 LAB — C-REACTIVE PROTEIN(CRP),INFLAMMATION: CRP INFLAMMATION: <0.4 mg/L (ref <8.0)

## 2021-12-11 MED ORDER — LISINOPRIL 30 MG TABLET
30.0000 mg | ORAL_TABLET | Freq: Every day | ORAL | Status: DC
Start: 2021-12-11 — End: 2022-07-31

## 2021-12-11 NOTE — Progress Notes (Signed)
Labs drawn

## 2021-12-11 NOTE — Nursing Note (Signed)
Pt. Here for recheck. No refills needed. Pt. C/o lightheadedness. Pt. C/o head pounding in her ears at times.Michelene Heady, RN

## 2021-12-11 NOTE — Progress Notes (Signed)
Family Medicine, [facility]  [address]  [place] [address]  [phone]      Patient Name:  Gwendolyn Crawford  MRN:  [id number]  DOB:  January 30, 1955    Date of Service:  12/11/2021    Chief Complaint:   Chief Complaint   Patient presents with    Follow Up 3 Months       Subjective:  Gwendolyn Crawford is a 66 y.o. female who returns for a f/u apt. She states that she hears her heart beat in her ears and has been dizzy for the past 3 weeks. She thinks that it has been about the same time as her bronchitis. She states that it is getting better. She denies every having symptoms like this with previous sinusitis or bronchitis.     Past Medical History:  Past Medical History:   Diagnosis Date    Allergic rhinitis     Anxiety     Arthritis     Depression     Diabetes mellitus, type 2 (CMS HCC)     Gout     Headache     Hypercholesterolemia     Hypertension     Hypothyroid     Hypothyroidism     Insomnia     PONV (postoperative nausea and vomiting)     RLS (restless legs syndrome)     Vitamin deficiency     Wears glasses             Past Surgical History:  Past Surgical History:   Procedure Laterality Date    COLONOSCOPY      ESOPHAGOGASTRODUODENOSCOPY      HX BREAST BIOPSY Left     HX BENIGN BREAST BX UNSURE WHICH SIDE    HX CHOLECYSTECTOMY      HX DENTAL EXTRACTION            Current Outpatient Medications   Medication Sig    acetaminophen (TYLENOL) 325 mg Oral Tablet Take 1 Tablet (325 mg total) by mouth Every 4 hours as needed for Pain    allopurinoL (ZYLOPRIM) 100 mg Oral Tablet Take 1 Tablet (100 mg total) by mouth Once a day for 180 days TAKE ONE TABLET BY MOUTH TWO TIMES A DAY Indications: treatment to prevent acute gout attack    ascorbic acid (VITAMIN C) 1,000 mg Oral Tablet Take 1 Tablet (1,000 mg total) by mouth Once a day    aspirin (ECOTRIN) 81 mg Oral Tablet, Delayed Release (E.C.) Take 1 Tablet (81 mg total) by mouth Once a day Pt reports Taking once weekly    cholecalciferol,  vitamin D3, 25 mcg (1,000 unit) Oral Tablet Take 1 Tablet (1,000 Units total) by mouth Once a day    cinnamon bark (CINNAMON ORAL) Take 1,000 mg by mouth bid    cyanocobalamin (VITAMIN B 12) 1,000 mcg Oral Tablet Take 1 Tablet (1,000 mcg total) by mouth Once a day    DULoxetine (CYMBALTA DR) 20 mg Oral Capsule, Delayed Release(E.C.) Take 1 Capsule (20 mg total) by mouth Once a day    gabapentin (NEURONTIN) 600 mg Oral Tablet Take 1 Tablet (600 mg total) by mouth Three times a day    glimepiride (AMARYL) 1 mg Oral Tablet Take 0.5 Tablets (0.5 mg total) by mouth Every morning with breakfast Indications: type 2 diabetes mellitus    levothyroxine (SYNTHROID) 100 mcg Oral Tablet TAKE ONE TABLET BY MOUTH EVERY MORNING DAILY    lisinopriL (PRINIVIL) 30 mg Oral Tablet  Take 1 Tablet (30 mg total) by mouth Once a day for 180 days for blood pressure Indications: high blood pressure, 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    loratadine (CLARITIN) 10 mg Oral Tablet Take 1 Tablet (10 mg total) by mouth Once a day    meclizine (ANTIVERT) 25 mg Oral Tablet Take 1 Tablet (25 mg total) by mouth Every 8 hours as needed    metFORMIN (GLUCOPHAGE) 500 mg Oral Tablet TAKE TWO TABLETS BY MOUTH TWO TIMES A DAY WITH FOOD FOR BLOOD SUGAR Indications: type 2 diabetes mellitus    naproxen Sodium (ALEVE) 220 mg Oral Tablet Take 1 Tablet (220 mg total) by mouth Every 12 hours as needed    triamcinolone acetonide (ARISTOCORT) 0.5 % Cream     turmeric (CURCUMIN N/A) 1 Capsule Once a day 500mg        Allergies   Allergen Reactions    Zocor [Simvastatin] Myalgia    Crestor [Rosuvastatin]  Other Adverse Reaction (Add comment) and Myalgia     unknown    Lipitor [Atorvastatin] Myalgia       Objective:    BP (!) 140/70 (Site: Left, Patient Position: Sitting, Cuff Size: Adult Large)   Pulse 76   Temp 36.3 C (97.3 F) (Thermal Scan)   Resp 20   Ht 1.626 m (5\' 4" )   Wt 89.9 kg (198 lb 3.2 oz)   SpO2 99%   BMI 34.02 kg/m       Body mass index is 34.02 kg/m.      Physical Exam:  Constitutional: she is oriented to person, place, and time and well-developed, well-nourished, and in no distress.   HENT: right tm obscured by cerumen and left tm clear with some cerumen. No erythema or exudate. No erythema or exudate.   Head: Normocephalic.   Eyes: Pupils are equal, round, and reactive to light. Conjunctivae and EOM are normal. Right eye exhibits no discharge. Left eye exhibits no discharge.   Neck: Normal range of motion. Neck supple. No thyromegaly present.   Cardiovascular: Normal rate, regular rhythm, normal heart sounds and intact distal pulses. Exam reveals no friction rub.   No murmur heard.  Pulmonary/Chest: Breath sounds normal. No respiratory distress. she  has no wheezes. she  has no rales.   Musculoskeletal:         General: No tenderness or edema.   Lymphadenopathy:     she  has no cervical adenopathy.   Neurological she  is alert and oriented to person, place, and time.   Skin: Skin is warm and dry.   Psychiatric: Affect normal.    Diabetic foot exam:  No edema bilaterally. No ulcerations or lesions bilaterally. Pulses normal bilaterally.       Data reviewed:  A1C: 8.2  A1C Date: 10/30/2021  BASIC METABOLIC PANEL  Lab Results   Component Value Date    SODIUM 141 06/28/2021    POTASSIUM 4.1 06/28/2021    CHLORIDE 104 06/28/2021    CO2 24 06/28/2021    ANIONGAP 13 06/28/2021    BUN 9 06/28/2021    CREATININE 0.86 06/28/2021    BUNCRRATIO 10 06/28/2021    GFR 74 06/28/2021    CALCIUM 10.2 06/28/2021    GLUCOSENF 128 (H) 06/28/2021         Recent Results (from the past 08/28/2021 hour(s))   MAMMO BILATERAL SCREENING-ADDL VIEWS/BREAST 08/28/2021 AS REQ BY RAD    Collection Time: 01/05/21 11:13 AM    Narrative    Computer aided detection (  CAD) technology has been applied to the standard   (CC and MLO) images.    3D tomosynthesis and 2D images were acquired and reviewed    FILMS COMPARED:  Compared to: 01/04/2020 MAMMO BILATERAL SCREENING-ADDL VIEWS/BREAST US AS   REQ BY RAD,  10/27/2018 MAMMO BILATERAL SCREENING-ADDL VIEWS/BREAST US AS   REQ BY RAD, and 10/24/2017 MAMMO BILAT SCREENING WO TOMO-ADDL VIEWS/BREAST   US AS REQ BY RAD    HISTORY:  Procedure: MAMMO BILATERAL SCREENING W TOMO-ADDL VIEWS/BREAST US AS REQ BY   RAD  Reason for exam: Breast cancer screening by mammogram      FINDINGS:  The breasts are almost entirely fatty.    Bilateral  There is no evidence of suspicious masses, calcifications, or other   abnormal findings.      Impression    Follow up mammogram in 1 year is recommended for both breasts.    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 Her BP has been running higher and will increase her dose of Lisinopril to 30mg  po daily. She is to continue with Metformin 500mg  two tabs twice a day. She is also taking Amaryl 0.5mg  po daily and her sugars are better. She will need to hold her Metformin for 48 hours post contrast. She is to f/u in 6 weeks and prn.        Treatment plan was discussed with patient and shared decision making employed. The patient was encouraged to call with any additional questions or concerns.     Discussed with patient effects and side effects of medications. Medication safety was discussed. A good faith effort was made to reconcile the patient's medications.   Orders  Placed This Encounter    CT ANGIO INTRA-EXTRA CRANIAL W/WO CONTRAST    Urine Microalbumin/Creatinine Ratio, Random    Basic Metabolic Panel-Non Fasting    lisinopriL (PRINIVIL) 30 mg Oral Tablet         Follow up:  No follow-ups on file.    This note was partially generated using MModal Fluency Direct system, and there may be some incorrect words, spellings, and punctuation that were not noted in checking the note before saving.    , MD  12/11/2021, 12:09

## 2021-12-12 ENCOUNTER — Other Ambulatory Visit (INDEPENDENT_AMBULATORY_CARE_PROVIDER_SITE_OTHER): Payer: Self-pay | Admitting: FAMILY MEDICINE

## 2021-12-12 ENCOUNTER — Telehealth (INDEPENDENT_AMBULATORY_CARE_PROVIDER_SITE_OTHER): Payer: Self-pay | Admitting: FAMILY MEDICINE

## 2021-12-12 DIAGNOSIS — E790 Hyperuricemia without signs of inflammatory arthritis and tophaceous disease: Secondary | ICD-10-CM

## 2021-12-12 MED ORDER — GABAPENTIN 600 MG TABLET
600.0000 mg | ORAL_TABLET | Freq: Three times a day (TID) | ORAL | 0 refills | Status: DC
Start: 2021-12-12 — End: 2022-01-09

## 2021-12-12 NOTE — Telephone Encounter (Signed)
Please let this patient know the results of her labs.  They are stable or normal except for her uric acid which was slightly increased at 6.8.  Check to see if she has missed any doses of her allopurinol and what her current dose is.

## 2021-12-13 ENCOUNTER — Telehealth (INDEPENDENT_AMBULATORY_CARE_PROVIDER_SITE_OTHER): Payer: Self-pay | Admitting: FAMILY MEDICINE

## 2021-12-13 NOTE — Telephone Encounter (Signed)
Have the patient take Allopurinol 200mg  po daily and decrease back down to Allopurinol 100mg  po daily if any issues with the increase. Will recheck her Uric acid again.

## 2021-12-13 NOTE — Telephone Encounter (Signed)
Pt. Notified and states that she has not missed any doses.  Pt. Is taking Allopurinol 100mg  daily and is suppose to for 180 days. Thanks.Michelene Heady, RN

## 2021-12-13 NOTE — Telephone Encounter (Signed)
Called and informed patient of instructions from Dr. Jones Skene. Showed understanding.

## 2021-12-13 NOTE — Telephone Encounter (Signed)
Called and spoke to patient and gave her the appt for the CTA and it is scheduled for Dec 1st @ 4:00 and arrive 30 min early to register; [facility] auth# [id number] and valid 12/11/2021 to 01/10/2022  Comments added by Minda Meo, Office Staff on 12/13/21 at 12:26.

## 2021-12-18 ENCOUNTER — Other Ambulatory Visit (INDEPENDENT_AMBULATORY_CARE_PROVIDER_SITE_OTHER): Payer: Self-pay | Admitting: FAMILY MEDICINE

## 2021-12-22 ENCOUNTER — Inpatient Hospital Stay
Admission: RE | Admit: 2021-12-22 | Discharge: 2021-12-22 | Disposition: A | Discharge status: Home / Self Care | Payer: Medicare Other | Source: Ambulatory Visit | Attending: FAMILY MEDICINE | Admitting: FAMILY MEDICINE

## 2021-12-22 ENCOUNTER — Other Ambulatory Visit: Payer: Self-pay

## 2021-12-22 DIAGNOSIS — R002 Palpitations: Secondary | ICD-10-CM

## 2021-12-22 DIAGNOSIS — R42 Dizziness and giddiness: Secondary | ICD-10-CM

## 2021-12-22 MED ORDER — IOPAMIDOL 370 MG IODINE/ML (76 %) INTRAVENOUS SOLUTION
100.0000 mL | INTRAVENOUS | Status: AC
Start: 2021-12-22 — End: 2021-12-22
  Administered 2021-12-22: 100 mL via INTRAVENOUS
  Filled 2021-12-22: qty 100

## 2021-12-24 ENCOUNTER — Telehealth (INDEPENDENT_AMBULATORY_CARE_PROVIDER_SITE_OTHER): Payer: Self-pay | Admitting: FAMILY MEDICINE

## 2021-12-24 NOTE — Telephone Encounter (Signed)
Please let this patient know the results of her CTA head and neck.  Read to her the impression of each section.  Remind the patient to keep her follow-up appointment with me coming up and go to the ER if any problems.

## 2021-12-25 NOTE — Telephone Encounter (Signed)
CTA head and neck impression read to patient.  Informed pt to keep her follow up apt 02/07/22 and to go to the ER if she has any problems.

## 2022-01-03 ENCOUNTER — Other Ambulatory Visit (INDEPENDENT_AMBULATORY_CARE_PROVIDER_SITE_OTHER): Payer: Self-pay | Admitting: FAMILY MEDICINE

## 2022-01-08 ENCOUNTER — Other Ambulatory Visit: Payer: Self-pay

## 2022-01-08 ENCOUNTER — Inpatient Hospital Stay
Admission: RE | Admit: 2022-01-08 | Discharge: 2022-01-08 | Disposition: A | Discharge status: Home / Self Care | Payer: Medicare Other | Source: Ambulatory Visit | Attending: FAMILY MEDICINE | Admitting: FAMILY MEDICINE

## 2022-01-08 ENCOUNTER — Encounter (HOSPITAL_COMMUNITY): Payer: Self-pay

## 2022-01-08 DIAGNOSIS — Z1231 Encounter for screening mammogram for malignant neoplasm of breast: Secondary | ICD-10-CM | POA: Insufficient documentation

## 2022-01-09 ENCOUNTER — Telehealth (INDEPENDENT_AMBULATORY_CARE_PROVIDER_SITE_OTHER): Payer: Self-pay | Admitting: FAMILY MEDICINE

## 2022-01-09 ENCOUNTER — Other Ambulatory Visit (INDEPENDENT_AMBULATORY_CARE_PROVIDER_SITE_OTHER): Payer: Self-pay | Admitting: FAMILY MEDICINE

## 2022-01-09 DIAGNOSIS — Z1231 Encounter for screening mammogram for malignant neoplasm of breast: Secondary | ICD-10-CM

## 2022-01-09 MED ORDER — GABAPENTIN 600 MG TABLET
600.0000 mg | ORAL_TABLET | Freq: Three times a day (TID) | ORAL | 0 refills | Status: DC
Start: 2022-01-09 — End: 2022-02-07

## 2022-01-09 NOTE — Telephone Encounter (Unsigned)
Please let this patient know the results of her mammogram.  Read to her the impression.  Remind her if she has any symptoms she needs to be seen.

## 2022-01-10 NOTE — Telephone Encounter (Signed)
Called patient and notified her of impression of mammography and to be seen if any symptoms occur. Showed understanding.

## 2022-01-19 ENCOUNTER — Telehealth (INDEPENDENT_AMBULATORY_CARE_PROVIDER_SITE_OTHER): Payer: Self-pay | Admitting: FAMILY MEDICINE

## 2022-01-19 MED ORDER — BENZONATATE 200 MG CAPSULE
200.0000 mg | ORAL_CAPSULE | Freq: Three times a day (TID) | ORAL | 0 refills | Status: DC
Start: 2022-01-19 — End: 2022-02-07

## 2022-01-19 MED ORDER — CEFUROXIME AXETIL 500 MG TABLET
500.0000 mg | ORAL_TABLET | Freq: Two times a day (BID) | ORAL | 0 refills | Status: DC
Start: 2022-01-19 — End: 2022-02-07

## 2022-01-19 NOTE — Telephone Encounter (Signed)
Ceftin 500 mg p.o. b.i.d. times 10 days sent to [facility] her pharmacy along with Tessalon 200 mg p.o. t.i.d. p.r.n.

## 2022-01-19 NOTE — Telephone Encounter (Signed)
Spoke with pt and notified her of this information/these actions taken. Advised if she has any problems on the medicine to stop it and let us know. She voiced understanding and denied known allergy to these medicines. Jettie Pagan, RN

## 2022-01-19 NOTE — Telephone Encounter (Signed)
Dr. Elta Guadeloupe: pt called stating she's been sick since 01/15/22. Yesterday had a negative home Covid test. Reports no fever.    Reports bronchitis, productive cough with yellowish sputum - same from nasal cavities. Reports sore throat.    Has been drinking water and taking Nyquil and Coricidin. Allergies as listed in chart and uses [employer] (medical).     Please advise. Thanks. Jettie Pagan, RN

## 2022-01-26 ENCOUNTER — Other Ambulatory Visit (INDEPENDENT_AMBULATORY_CARE_PROVIDER_SITE_OTHER): Payer: Self-pay | Admitting: FAMILY MEDICINE

## 2022-01-26 NOTE — Telephone Encounter (Signed)
Patient should have a refill on his

## 2022-01-30 ENCOUNTER — Telehealth (INDEPENDENT_AMBULATORY_CARE_PROVIDER_SITE_OTHER): Payer: Self-pay | Admitting: FAMILY MEDICINE

## 2022-01-30 DIAGNOSIS — I779 Disorder of arteries and arterioles, unspecified: Secondary | ICD-10-CM

## 2022-01-30 NOTE — Telephone Encounter (Unsigned)
Please let the patient know will get neurosurgery input regarding her CTA results at [facility]. Will discuss further at her f/u apt coming up.

## 2022-01-31 NOTE — Telephone Encounter (Signed)
Left detailed message informing patient of physician's orders. Encouraged to communicate with staff any questions or concerns.

## 2022-02-05 ENCOUNTER — Telehealth (INDEPENDENT_AMBULATORY_CARE_PROVIDER_SITE_OTHER): Payer: Self-pay | Admitting: FAMILY MEDICINE

## 2022-02-05 IMAGING — MR MRI PITUITARY WITH AND WITHOUT IV CONTRAST
25 of 28 series · 30 of 48 positions shown · IV contrast (gadolinium)
Comparison: none

FINAL REPORT:
HISTORY: Mild cognitive impairment of uncertain or unknown etiology.
MRI pituitary with and without contrast.
TECHNIQUE: Multiplanar multisequence MR imaging obtained of the pituitary pre and postcontrast after the uneventful administration of IV gadolinium.
Findings comparison previous: MRI brain without contrast 11/22/2021.
No restricted diffusion or associated ADC mapping defect to suggest acute or early subacute infarction. No intracranial hemorrhage. Mild intracranial atrophy. Mild-to-moderate periventricular and bifrontal and parietal foci of T2 prolongation are consistent with chronic microvascular ischemic change. No cerebral or cerebellar mass or mass effect, midline shift, or extra-axial fluid collection. No hydrocephalus. Larger intracranial flow voids are visualized. The basilar cisterns and aqueducts are maintained. Bone marrow signal intensity is preserved skull base and calvarium.
The orbits, paranasal sinuses, and mastoids are clear.
The pituitary gland is again noted to be slightly increased in height for age at 9 mm on sagittal T1-weighted precontrast imaging. Heterogeneous hypoenhancing appearance of the pituitary gland on postcontrast imaging measuring 0.8 x 1.3 x 0.9 cm suggestive of a minimal pituitary macroadenoma (series 27 image 7 and series 26 image 8). The pituitary infundibulum remains midline. The optic chiasm is uninvolved. Cavernous carotid flow voids are within normal limits.
No abnormal enhancing parenchymal, leptomeningeal, or dural lesions.

[Series 1: survey · sagittal · 1.6mm · 1.62mm/px · 4 of 125 slices shown]
[im 1/125]
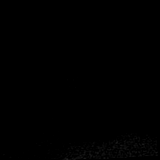
[im 42/125]
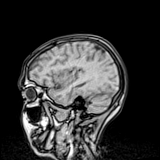
[im 83/125]
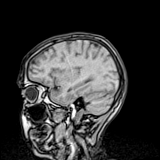
[im 125/125]
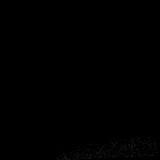

[Series 2: survey_mpr_sag · sagittal · 1.6mm · 1.60mm/px · 1 of 5 slices shown]
[im 1/5]
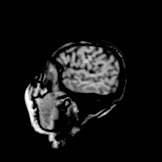

[Series 3: survey_mpr_cor · coronal · 1.6mm · 1.60mm/px · 1 of 3 slices shown]
[im 1/3]
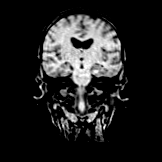

[Series 4: survey_mpr_tra · axial · 1.6mm · 1.60mm/px · 1 of 3 slices shown]
[im 1/3]
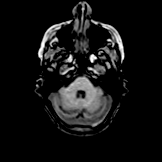

[Series 5: T1 · sagittal · 5.0mm · 0.72mm/px · 1 of 25 slices shown (1 of 4)]
[im 1/25]
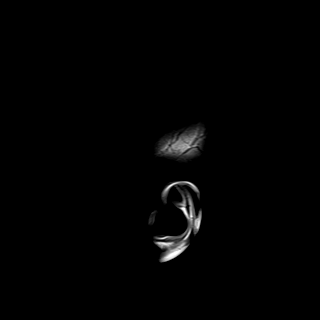

[Series 9: ax dwi_tracew · axial · 5.0mm · 0.60mm/px · 1 of 27 slices shown]
[im 1/27]
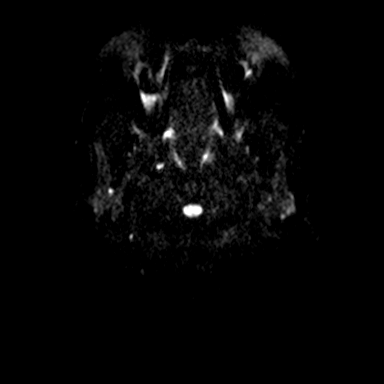

[Series 10: ax dwi_adc · axial · 5.0mm · 0.60mm/px · 1 of 27 slices shown]
[im 1/27]
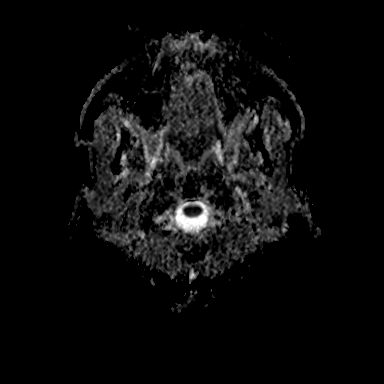

[Series 11: ax dwi_exp · axial · 5.0mm · 0.60mm/px · 1 of 27 slices shown]
[im 1/27]
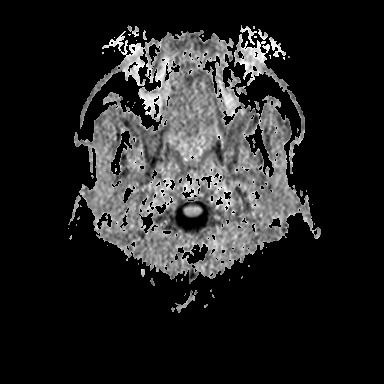

[Series 12: T2 fat-sat · axial · 5.0mm · 0.51mm/px · 1 of 27 slices shown]
[im 1/27]
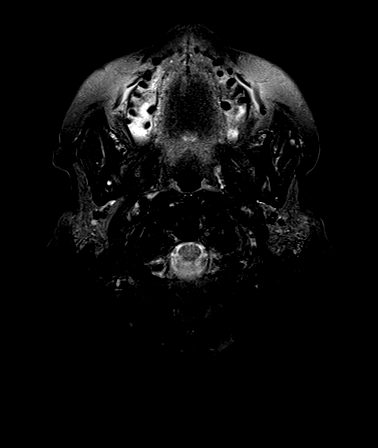

[Series 13: T1 · axial · 5.0mm · 0.72mm/px · 1 of 27 slices shown (2 of 4)]
[im 1/27]
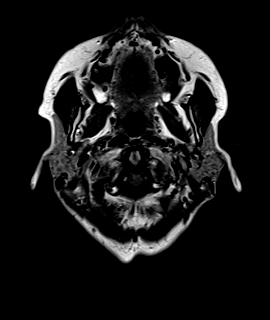

[Series 14: GRE · axial · 5.0mm · 0.45mm/px · 1 of 27 slices shown]
[im 1/27]
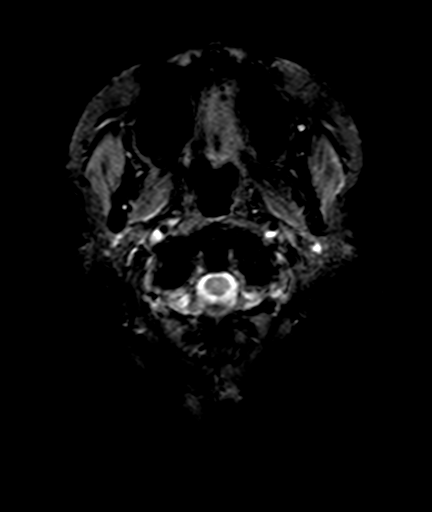

[Series 15: T1 · sagittal · non-contrast · 2.0mm · 0.68mm/px · 1 of 15 slices shown (3 of 4)]
[im 1/15]
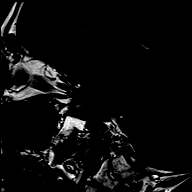

[Series 16: T1 · coronal · non-contrast · 2.0mm · 0.68mm/px · 1 of 15 slices shown (4 of 4)]
[im 1/15]
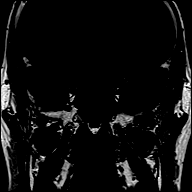

[Series 17: T2 · coronal · 3.0mm · 0.45mm/px · 1 of 12 slices shown]
[im 1/12]
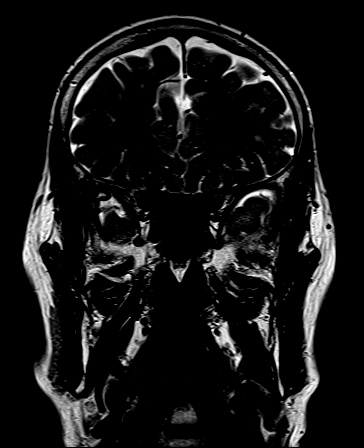

[Series 18: cor dynamic · coronal · 3.0mm · 0.49mm/px · 1 of 15 slices shown (1 of 5)]
[im 1/15]
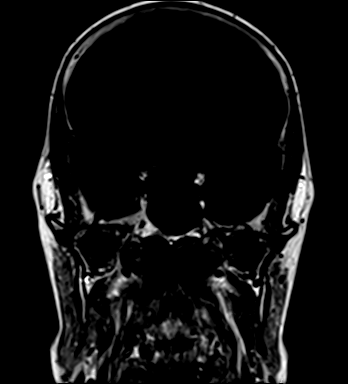

[Series 19: cor dynamic · coronal · 3.0mm · 0.49mm/px · 1 of 15 slices shown (2 of 5)]
[im 1/15]
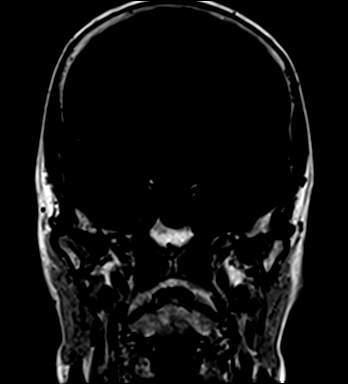

[Series 20: cor dynamic · coronal · 3.0mm · 0.49mm/px · 1 of 15 slices shown (3 of 5)]
[im 1/15]
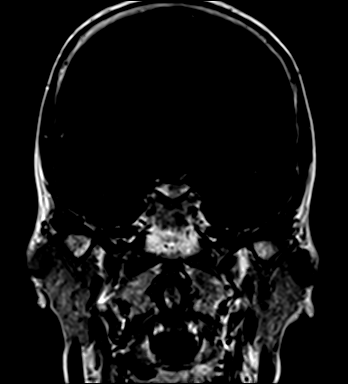

[Series 21: cor dynamic · coronal · 3.0mm · 0.49mm/px · 1 of 15 slices shown (4 of 5)]
[im 1/15]
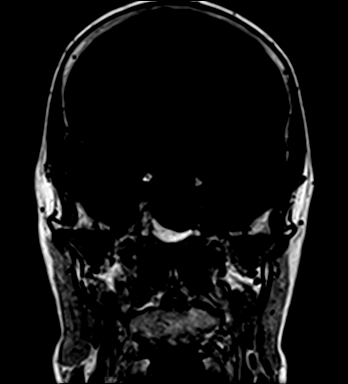

[Series 22: cor dynamic · coronal · 3.0mm · 0.49mm/px · 1 of 15 slices shown (5 of 5)]
[im 1/15]
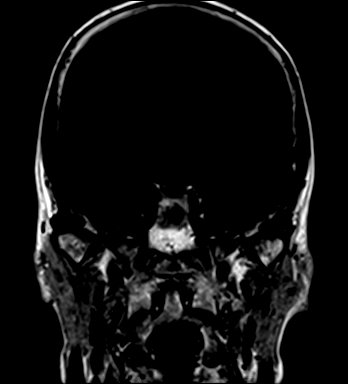

[Series 23: bSSFP post-contrast · coronal · 0.7mm · 0.35mm/px · 2 of 64 slices shown]
[im 1/64]
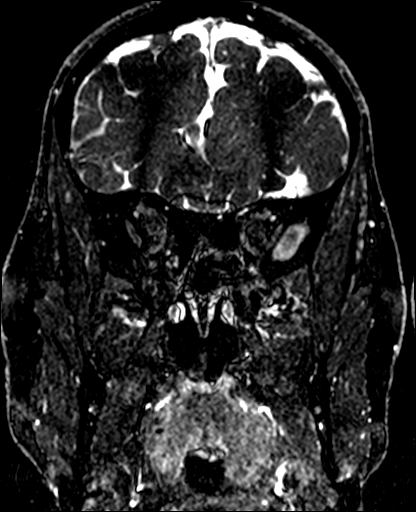
[im 64/64]
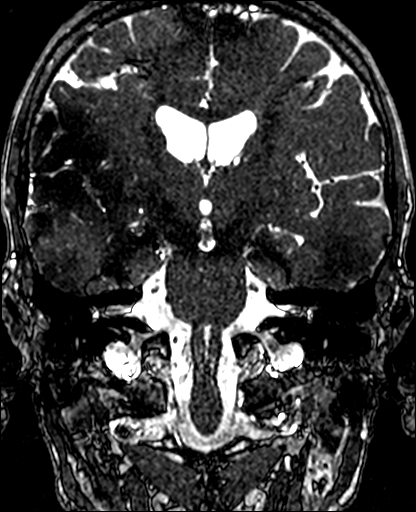

[Series 24: bSSFP · sagittal · 1.0mm · 0.35mm/px · 1 of 45 slices shown (1 of 2)]
[im 1/45]
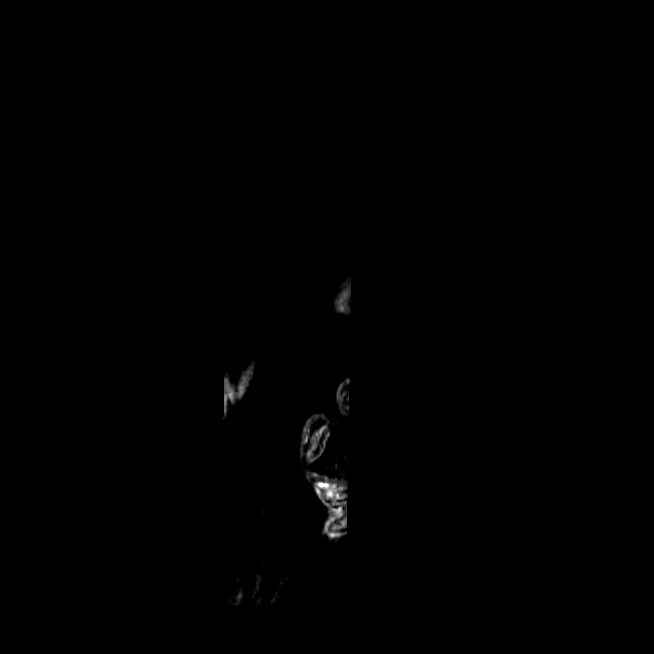

[Series 25: bSSFP · axial · 1.0mm · 0.35mm/px · z∈[-71,+76]mm · 2 of 79 slices shown (2 of 2)]
[im 1/79]
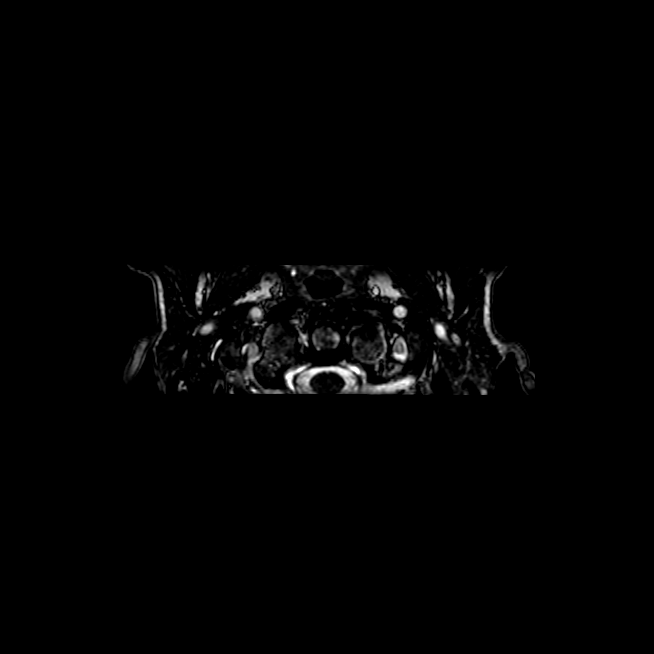
[im 79/79]
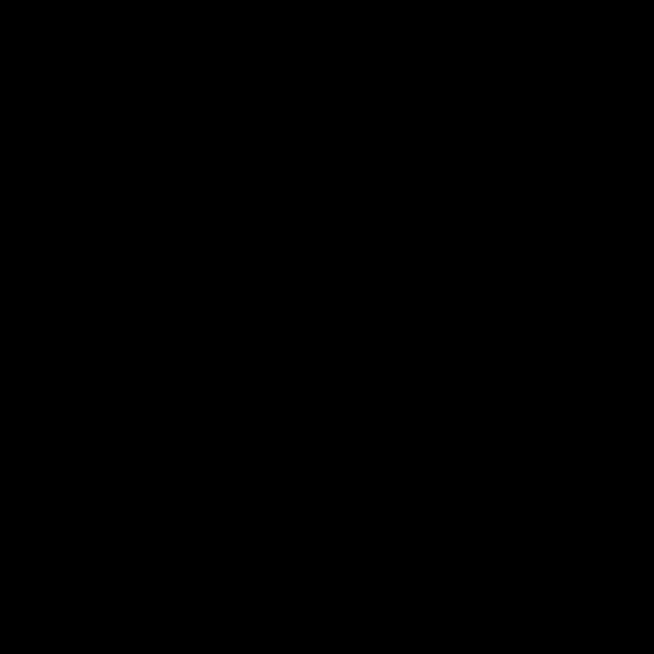

[Series 26: T1 post-contrast · coronal · 2.0mm · 0.68mm/px · 1 of 15 slices shown (1 of 3)]
[im 1/15]
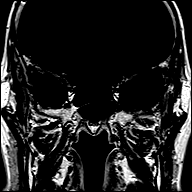

[Series 27: T1 post-contrast · sagittal · 2.0mm · 0.68mm/px · 1 of 15 slices shown (2 of 3)]
[im 1/15]
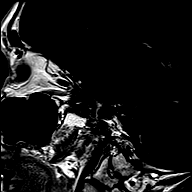

[Series 28: T1 post-contrast · axial · 5.0mm · 0.72mm/px · 1 of 27 slices shown (3 of 3)]
[im 1/27]
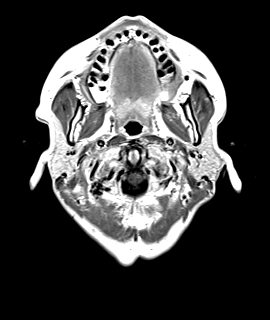

[30 of 48 positions shown; findings below may reference images not displayed]

IMPRESSION: 1. Likely minimal pituitary macroadenoma with 0.8 x 1.3 x 0.9 cm.
2. Mild intracranial atrophy.
3. Mild to moderate chronic periventricular and bifrontal and parietal microvascular ischemic change.

## 2022-02-05 NOTE — Telephone Encounter (Signed)
Voicemail left by patient stating "I'd like to talk to [facility] if she could call me back"

## 2022-02-05 NOTE — Telephone Encounter (Signed)
Please see note from 01/30/2022 for further documentation as this conversation is pertaining to that note. Jettie Pagan, RN

## 2022-02-05 NOTE — Telephone Encounter (Signed)
Pt returned call. Relayed this information to her - discussed. She voiced understanding. Camora Tremain, RN

## 2022-02-06 NOTE — Progress Notes (Unsigned)
Family Medicine, [facility]  [address]  [place] [address]  [phone]      Patient Name:  Gwendolyn Crawford  MRN:  [id number]  DOB:  [birth year]    Date of Service:  02/07/2022    Chief Complaint: No chief complaint on file.      Subjective:  DAMARYS SPEIR is a 67 y.o. female who returns ***    Past Medical History:  Past Medical History:   Diagnosis Date    Allergic rhinitis     Anxiety     Arthritis     Depression     Diabetes mellitus, type 2 (CMS HCC)     Encounter for initial annual wellness visit (AWV) in Medicare patient 10/05/2020    Gout     Headache     Hypercholesterolemia     Hypertension     Hypothyroid     Hypothyroidism     Insomnia     PONV (postoperative nausea and vomiting)     RLS (restless legs syndrome)     Vitamin deficiency     Wears glasses             Past Surgical History:  Past Surgical History:   Procedure Laterality Date    COLONOSCOPY      ESOPHAGOGASTRODUODENOSCOPY      HX BREAST BIOPSY Left     HX BENIGN BREAST BX UNSURE WHICH SIDE    HX CHOLECYSTECTOMY      HX DENTAL EXTRACTION              Review of Systems:  Review of Systems   Constitutional: Negative for fever.   HENT: Negative for sore throat.    Eyes: Negative for blurred vision.   Respiratory: Negative for cough and shortness of breath.    Cardiovascular: Negative for chest pain.   Gastrointestinal: Negative for abdominal pain, nausea and vomiting.   Genitourinary: Negative for dysuria.   Musculoskeletal: Negative for falls.   Skin: Negative for rash.   Neurological: Negative for headaches.         Current Outpatient Medications   Medication Sig    acetaminophen (TYLENOL) 325 mg Oral Tablet Take 1 Tablet (325 mg total) by mouth Every 4 hours as needed for Pain    allopurinoL (ZYLOPRIM) 100 mg Oral Tablet Take 1 Tablet (100 mg total) by mouth Once a day for 180 days TAKE ONE TABLET BY MOUTH TWO TIMES A DAY Indications: treatment to prevent acute gout attack    ascorbic acid (VITAMIN C) 1,000 mg  Oral Tablet Take 1 Tablet (1,000 mg total) by mouth Once a day    aspirin (ECOTRIN) 81 mg Oral Tablet, Delayed Release (E.C.) Take 1 Tablet (81 mg total) by mouth Once a day Pt reports Taking once weekly    cholecalciferol, vitamin D3, 25 mcg (1,000 unit) Oral Tablet Take 1 Tablet (1,000 Units total) by mouth Once a day    cinnamon bark (CINNAMON ORAL) Take 1,000 mg by mouth bid    cyanocobalamin (VITAMIN B 12) 1,000 mcg Oral Tablet Take 1 Tablet (1,000 mcg total) by mouth Once a day    DULoxetine (CYMBALTA DR) 20 mg Oral Capsule, Delayed Release(E.C.) Take 1 Capsule (20 mg total) by mouth Once a day    gabapentin (NEURONTIN) 600 mg Oral Tablet Take 1 Tablet (600 mg total) by mouth Three times a day    glimepiride (AMARYL) 1 mg Oral Tablet Take 0.5 Tablets (0.5 mg total) by  mouth Every morning with breakfast Indications: type 2 diabetes mellitus    levothyroxine (SYNTHROID) 100 mcg Oral Tablet TAKE 1 TABLET BY MOUTH ONCE DAILY ON AN EMPTY STOMACH FOR THYROID    lisinopriL (PRINIVIL) 30 mg Oral Tablet Take 1 Tablet (30 mg total) by mouth Once a day for 180 days for blood pressure Indications: high blood pressure, 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    loratadine (CLARITIN) 10 mg Oral Tablet Take 1 Tablet (10 mg total) by mouth Once a day    meclizine (ANTIVERT) 25 mg Oral Tablet Take 1 Tablet (25 mg total) by mouth Every 8 hours as needed    metFORMIN (GLUCOPHAGE) 500 mg Oral Tablet TAKE TWO TABLETS BY MOUTH TWO TIMES A DAY WITH FOOD FOR BLOOD SUGAR Indications: type 2 diabetes mellitus    naproxen Sodium (ALEVE) 220 mg Oral Tablet Take 1 Tablet (220 mg total) by mouth Every 12 hours as needed    triamcinolone acetonide (ARISTOCORT) 0.5 % Cream     turmeric (CURCUMIN N/A) 1 Capsule Once a day 500mg        Allergies   Allergen Reactions    Zocor [Simvastatin] Myalgia    Crestor [Rosuvastatin]  Other Adverse Reaction (Add comment) and Myalgia     unknown    Lipitor [Atorvastatin] Myalgia       Objective:    There were no  vitals taken for this visit.      There is no height or weight on file to calculate BMI.     Physical Exam:  Constitutional: she is oriented to person, place, and time and well-developed, well-nourished, and in no distress.   HENT:   Head: Normocephalic.   Eyes: Pupils are equal, round, and reactive to light. Conjunctivae and EOM are normal. Right eye exhibits no discharge. Left eye exhibits no discharge.   Neck: Normal range of motion. Neck supple. No thyromegaly present.   Cardiovascular: Normal rate, regular rhythm, normal heart sounds and intact distal pulses. Exam reveals no friction rub.   No murmur heard.  Pulmonary/Chest: Breath sounds normal. No respiratory distress. she  has no wheezes. she  has no rales.   Musculoskeletal:         General: No tenderness or edema.   Lymphadenopathy:     she  has no cervical adenopathy.   Neurological she  is alert and oriented to person, place, and time.   Skin: Skin is warm and dry.   Psychiatric: Affect normal.    Diabetic foot exam:  No edema bilaterally. No ulcerations or lesions bilaterally. Pulses normal bilaterally.       Data reviewed:  A1C: 8.2  A1C Date: 16/10/9602  BASIC METABOLIC PANEL  Lab Results   Component Value Date    SODIUM 141 12/11/2021    POTASSIUM 4.2 12/11/2021    CHLORIDE 104 12/11/2021    CO2 25 12/11/2021    ANIONGAP 12 12/11/2021    BUN 12 12/11/2021    CREATININE 0.76 12/11/2021    BUNCRRATIO 16 12/11/2021    GFR 86 12/11/2021    CALCIUM 10.1 12/11/2021    GLUCOSENF 164 (H) 02/10/2019         Recent Results (from the past 17520 hour(s))   MAMMO BILATERAL SCREENING-ADDL VIEWS/BREAST US AS REQ BY RAD    Collection Time: 01/08/22 10:52 AM    Narrative    Computer aided detection (CAD) technology has been applied to the standard   (CC and MLO) images.    3D tomosynthesis and 2D  images were acquired and reviewed.    FILMS COMPARED:  Compared to: 01/05/2021 MAMMO BILATERAL SCREENING-ADDL VIEWS/BREAST US AS   REQ BY RAD, 01/04/2020 MAMMO BILATERAL  SCREENING-ADDL VIEWS/BREAST US AS   REQ BY RAD, 10/27/2018 MAMMO BILATERAL SCREENING-ADDL VIEWS/BREAST US AS   REQ BY RAD, and 10/24/2017 MAMMO BILAT SCREENING WO TOMO-ADDL VIEWS/BREAST   US AS REQ BY RAD    HISTORY:  Procedure: MAMMO BILATERAL SCREENING W TOMO-ADDL VIEWS/BREAST US AS REQ BY   RAD  Reason for exam: Breast cancer screening by mammogram       FINDINGS:  The breasts are almost entirely fatty.    Bilateral  There is no evidence of suspicious masses, calcifications, or other   abnormal findings.      Impression    Follow up mammogram in 1 year is recommended for both breasts.    BI-RADS ATLAS category (overall): 1 - Negative    MFL          Social Determinants identified with potential adverse effects on health outcomes:  none           Assessment/Plan:  No diagnosis found.       Plan:        Treatment plan was discussed with patient and shared decision making employed. The patient was encouraged to call with any additional questions or concerns.     Discussed with patient effects and side effects of medications. Medication safety was discussed. A good faith effort was made to reconcile the patient's medications.   No orders of the defined types were placed in this encounter.        Follow up:  No follow-ups on file.    This note was partially generated using MModal Fluency Direct system, and there may be some incorrect words, spellings, and punctuation that were not noted in checking the note before saving.    Charlie Pitter, MD  02/06/2022, 19:25

## 2022-02-07 ENCOUNTER — Ambulatory Visit (INDEPENDENT_AMBULATORY_CARE_PROVIDER_SITE_OTHER): Payer: Medicare Other

## 2022-02-07 ENCOUNTER — Encounter (INDEPENDENT_AMBULATORY_CARE_PROVIDER_SITE_OTHER): Payer: Self-pay

## 2022-02-07 ENCOUNTER — Ambulatory Visit: Payer: Medicare Other | Attending: FAMILY MEDICINE | Admitting: FAMILY MEDICINE

## 2022-02-07 ENCOUNTER — Other Ambulatory Visit: Payer: Self-pay

## 2022-02-07 ENCOUNTER — Telehealth (INDEPENDENT_AMBULATORY_CARE_PROVIDER_SITE_OTHER): Payer: Self-pay | Admitting: FAMILY MEDICINE

## 2022-02-07 ENCOUNTER — Encounter (INDEPENDENT_AMBULATORY_CARE_PROVIDER_SITE_OTHER): Payer: Self-pay | Admitting: FAMILY MEDICINE

## 2022-02-07 VITALS — BP 120/74 | HR 83 | Temp 97.0°F | Resp 18 | Ht 64.0 in | Wt 200.0 lb

## 2022-02-07 DIAGNOSIS — G2581 Restless legs syndrome: Secondary | ICD-10-CM | POA: Insufficient documentation

## 2022-02-07 DIAGNOSIS — F419 Anxiety disorder, unspecified: Secondary | ICD-10-CM

## 2022-02-07 DIAGNOSIS — F325 Major depressive disorder, single episode, in full remission: Secondary | ICD-10-CM

## 2022-02-07 DIAGNOSIS — E039 Hypothyroidism, unspecified: Secondary | ICD-10-CM

## 2022-02-07 DIAGNOSIS — E790 Hyperuricemia without signs of inflammatory arthritis and tophaceous disease: Secondary | ICD-10-CM

## 2022-02-07 DIAGNOSIS — I10 Essential (primary) hypertension: Secondary | ICD-10-CM

## 2022-02-07 DIAGNOSIS — E782 Mixed hyperlipidemia: Secondary | ICD-10-CM | POA: Insufficient documentation

## 2022-02-07 DIAGNOSIS — Z87891 Personal history of nicotine dependence: Secondary | ICD-10-CM | POA: Insufficient documentation

## 2022-02-07 DIAGNOSIS — Z0000 Encounter for general adult medical examination without abnormal findings: Secondary | ICD-10-CM

## 2022-02-07 DIAGNOSIS — Z1211 Encounter for screening for malignant neoplasm of colon: Secondary | ICD-10-CM | POA: Insufficient documentation

## 2022-02-07 DIAGNOSIS — E119 Type 2 diabetes mellitus without complications: Secondary | ICD-10-CM

## 2022-02-07 DIAGNOSIS — K5730 Diverticulosis of large intestine without perforation or abscess without bleeding: Secondary | ICD-10-CM | POA: Insufficient documentation

## 2022-02-07 DIAGNOSIS — E1142 Type 2 diabetes mellitus with diabetic polyneuropathy: Secondary | ICD-10-CM | POA: Insufficient documentation

## 2022-02-07 DIAGNOSIS — Z7984 Long term (current) use of oral hypoglycemic drugs: Secondary | ICD-10-CM | POA: Insufficient documentation

## 2022-02-07 LAB — HGA1C (HEMOGLOBIN A1C WITH EST AVG GLUCOSE)
ESTIMATED AVERAGE GLUCOSE: 171 mg/dL
HEMOGLOBIN A1C: 7.6 % — ABNORMAL HIGH (ref 4.0–6.0)

## 2022-02-07 LAB — SEDIMENTATION RATE: ERYTHROCYTE SEDIMENTATION RATE (ESR): 9 mm/hr (ref <30)

## 2022-02-07 LAB — URIC ACID: URIC ACID: 5 mg/dL (ref 2.9–6.3)

## 2022-02-07 MED ORDER — GABAPENTIN 600 MG TABLET
600.0000 mg | ORAL_TABLET | Freq: Three times a day (TID) | ORAL | 0 refills | Status: DC
Start: 2022-02-07 — End: 2022-03-09

## 2022-02-07 NOTE — Telephone Encounter (Signed)
Please let this patient know that her uric acid is better. Her ESR is normal and her A1c is better (8.2 in Oct down to 7.6). Continue with her diet,exercise as tolerated and weight loss.

## 2022-02-07 NOTE — Nursing Note (Signed)
Venipuncture performed in office on right arm antecubital vein, dry pressure dressing was applied to site and patient tolerated it well.  Specimen was centrifuged, aliquoted as needed and specimen was labeled and packaged for transport.    Sparrow [place], MA

## 2022-02-07 NOTE — Progress Notes (Signed)
FAMILY MEDICINE, [facility]  [address]   [place] [address]  Operated by [facility]  Medicare Annual Wellness Visit    Name: Gwendolyn Crawford MRN:  [id number]   Date: 02/07/2022 Age: 67 y.o.       SUBJECTIVE:   Gwendolyn Crawford is a 67 y.o. female for presenting for Medicare Wellness exam.   I have reviewed and reconciled the medication list with the patient today.        10/05/2020    10:00 AM 06/28/2021     9:00 AM 02/07/2022     9:00 AM   Comprehensive Health Assessment-Adult   Do you wish to complete this form? Yes Yes Yes   During the past 4 weeks, how would you rate your health in general? Fair Good Good   During the past 4 weeks, how much difficulty have you had doing your usual activities inside and outside your home because of medical or emotional problems? A little bit of difficulty No difficulty at all Some difficulty   During the past 4 weeks, was someone available to help you if you needed and wanted help? Yes, as much as I wanted Yes, quite a bit Yes, as much as I wanted   In the past year, how many times have you gone to the emergency department or been admitted to a hospital for a health problem? None None None   Are you generally satisfied with your sleep? No Yes Yes   Do you have enough money to buy things you need in everyday life, such as food, clothing, medicines, and housing? Yes, always Yes, always Yes, always   Can you get to places beyond walking distance without help?  (For example, can you drive your own car or travel alone on buses)? Yes Yes Yes   Do you fasten your seatbelt when you are in a car? Yes, usually Yes, usually Yes, usually   Do you exercise 20 minutes 3 or more days per week (such as walking, dancing, biking, mowing grass, swimming)? Yes, most of the time Yes, most of the time Yes, some of the time   How often do you eat food that is healthy (fruits, vegetables, lean meats) instead of unhealthy (sweets, fast food, junk food,  fatty foods)? Most of the time Most of the time Some of the time   Have your parents, brothers or sisters had any of the following problems before the age of 47? (check all that apply)   Heart problems, or hardening of the arteries   How often do you have trouble taking medicines the eay you are told to take them? I always take them as prescribed I always take them as prescribed I always take them as prescribed   Do you need any help communicating with your doctors and nurses because of vision or hearing problems? No No No   During the past 12 months, have you experienced confusion or memory loss that is happening more often or is getting worse? Yes No No   Do you have one person you think of as your personal doctor (primary care provider or family doctor)?  Yes Yes   If you are seeing a Primary Care Provider (PCP) or family doctor. please list their name  Leonie Man Sandrika Schwinn   Are you now also seeing any specialist physician(s) (such as eye doctor, foot doctor, skin doctor)? No Yes No   If you are seeing a specialist for anything such  as foot, eye, skin, etc.  please list their name(s)  Dr. Raynelle Dick, Dermatologist-Dr. Rexford Maus    How confident are you that you can control or manage most of your health problems? Very confident Somewhat confident Somewhat confident         I have reviewed and updated as appropriate the past medical, family and social history. 02/07/2022 as summarized below:  Past Medical History:   Diagnosis Date    Allergic rhinitis     Anxiety     Arthritis     Depression     Diabetes mellitus, type 2 (CMS HCC)     Encounter for initial annual wellness visit (AWV) in Medicare patient 10/05/2020    Gout     Headache     Hypercholesterolemia     Hypertension     Hypothyroid     Hypothyroidism     Insomnia     PONV (postoperative nausea and vomiting)     RLS (restless legs syndrome)     Vitamin deficiency     Wears glasses      Past Surgical History:   Procedure Laterality  Date    Colonoscopy      Esophagogastroduodenoscopy      Hx breast biopsy Left     Hx cholecystectomy      Hx dental extraction       Current Outpatient Medications   Medication Sig    acetaminophen (TYLENOL) 325 mg Oral Tablet Take 1 Tablet (325 mg total) by mouth Every 4 hours as needed for Pain    allopurinoL (ZYLOPRIM) 100 mg Oral Tablet Take 1 Tablet (100 mg total) by mouth Once a day for 180 days TAKE ONE TABLET BY MOUTH TWO TIMES A DAY Indications: treatment to prevent acute gout attack    ascorbic acid (VITAMIN C) 1,000 mg Oral Tablet Take 1 Tablet (1,000 mg total) by mouth Once a day    aspirin (ECOTRIN) 81 mg Oral Tablet, Delayed Release (E.C.) Take 1 Tablet (81 mg total) by mouth Once a day Pt reports Taking once weekly    cholecalciferol, vitamin D3, 25 mcg (1,000 unit) Oral Tablet Take 1 Tablet (1,000 Units total) by mouth Once a day    cinnamon bark (CINNAMON ORAL) Take 1,000 mg by mouth bid    CINNAMON BARK EXTRACT ORAL Take 1,000 mg by mouth Twice daily    cyanocobalamin (VITAMIN B 12) 1,000 mcg Oral Tablet Take 1 Tablet (1,000 mcg total) by mouth Once a day    DULoxetine (CYMBALTA DR) 20 mg Oral Capsule, Delayed Release(E.C.) Take 1 Capsule (20 mg total) by mouth Once a day    gabapentin (NEURONTIN) 600 mg Oral Tablet Take 1 Tablet (600 mg total) by mouth Three times a day    glimepiride (AMARYL) 1 mg Oral Tablet Take 0.5 Tablets (0.5 mg total) by mouth Every morning with breakfast Indications: type 2 diabetes mellitus    levothyroxine (SYNTHROID) 100 mcg Oral Tablet TAKE 1 TABLET BY MOUTH ONCE DAILY ON AN EMPTY STOMACH FOR THYROID    lisinopriL (PRINIVIL) 30 mg Oral Tablet Take 1 Tablet (30 mg total) by mouth Once a day for 180 days for blood pressure Indications: high blood pressure, 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    loratadine (CLARITIN) 10 mg Oral Tablet Take 1 Tablet (10 mg total) by mouth Once a day    meclizine (ANTIVERT) 25 mg Oral Tablet Take 1 Tablet (25 mg total) by mouth Every 8 hours as  needed    metFORMIN (GLUCOPHAGE)  500 mg Oral Tablet TAKE TWO TABLETS BY MOUTH TWO TIMES A DAY WITH FOOD FOR BLOOD SUGAR Indications: type 2 diabetes mellitus    naproxen Sodium (ALEVE) 220 mg Oral Tablet Take 1 Tablet (220 mg total) by mouth Every 12 hours as needed    triamcinolone acetonide (ARISTOCORT) 0.5 % Cream     turmeric (CURCUMIN N/A) 1 Capsule Once a day 500mg      Family Medical History:       Problem Relation (Age of Onset)    Alzheimer's/Dementia Mother, Sister    COPD Sister    Kidney Disease Father    No Known Problems Brother, Maternal Grandmother, Maternal Grandfather, Paternal Grandmother, Paternal Grandfather, Daughter, Son, Maternal Aunt, Maternal Uncle, Paternal Aunt, Paternal Uncle, Other    Stroke Mother, Sister            Social History     Socioeconomic History    Marital status: Married   Tobacco Use    Smoking status: Former     Years: 1     Types: Cigarettes     Passive exposure: Past    Smokeless tobacco: Never   Vaping Use    Vaping Use: Never used   Substance and Sexual Activity    Alcohol use: Not Currently    Drug use: Never     Social Determinants of Health     Financial Resource Strain: Low Risk  (02/07/2022)    Financial Resource Strain     SDOH Financial: No   Transportation Needs: Low Risk  (02/07/2022)    Transportation Needs     SDOH Transportation: No   Social Connections: Medium Risk (02/07/2022)    Social Connections     SDOH Social Isolation: 3 to 5 times a week   Intimate Partner Violence: Low Risk  (02/07/2022)    Intimate Partner Violence     SDOH Domestic Violence: No   Housing Stability: Low Risk  (02/07/2022)    Housing Stability     SDOH Housing Situation: I have housing.     SDOH Housing Worry: No   Health Literacy: Low Risk  (10/30/2021)    Health Literacy     SDOH Health Literacy: Never   Employment Status: Low Risk  (02/07/2022)    Employment Status     SDOH Employment: Otherwise unemployed but not seeking work (ex. 02/09/2022, retired, disabled, unpaid primary care  giver)         List of Current Health Care Providers   Care Team       PCP       Name Type Specialty Phone Number    Consulting civil engineer, MD Physician [facility] [phone]              Care Team       Name Type Specialty Phone Number    [phone], Norberta Keens Not available OPTOMETRY [phone]    [phone] Not available Not available Not available                      Health Maintenance   Topic Date Due    Medicare Annual Wellness Visit - Calendar Year Insurers  01/22/2022    Diabetic A1C  01/30/2022    Diabetic Kidney Health eGFR  12/12/2022    Diabetic Kidney Health Microalb/Cr Ratio  12/12/2022    Diabetic Retinal Exam  01/04/2023    Mammography  01/09/2024    Colonoscopy  10/02/2028    Adult Tdap-Td (2 - Td  or Tdap) 01/22/2029    Osteoporosis screening  06/10/2030    Hepatitis C screening  Completed    Influenza Vaccine  Completed    Shingles Vaccine  Completed    Pneumococcal Vaccination, Age 33+  Completed    Meningococcal Vaccine  Aged Out    Depression Screening  Discontinued    Pap smear  Discontinued    Covid-19 Vaccine  Discontinued     Medicare Wellness Assessment   Medicare initial or wellness physical in the last year?: Yes  Advance Directives   Does patient have a living will or MPOA: YES   Has patient provided [employer] with a copy?: YES   Advance directive information given to the patient today?: no      Activities of Daily Living   Do you need help with dressing, bathing, or walking?: No   Do you need help with shopping, housekeeping, medications, or finances?: No   Do you have rugs in hallways, broken steps, or poor lighting?: No   Do you have grab bars in your bathroom, non-slip strips in your tub, and hand rails on your stairs?: No   Urinary Incontinence Screen   Do you ever leak urine when you don't want to?: YES   Cognitive Function Screen (1=Yes, 0=No)   What is you age?: Correct   What is the time to the nearest hour?: Correct   What is the year?: Correct   What is the name  of this clinic?: Correct   Can the patient recognize two persons (the doctor, the nurse, home help, etc.)?: Correct   What is the date of your birth? (day and month sufficient) : Correct   In what year did World War II end?: Incorrect   Who is the current president of the [place]?: Correct   Count from 20 down to 1?: Correct   What address did I give you earlier?: Correct   Total Score: 9   Interpretation of Total Score: Greater than 6 Normal   Hearing Screen   Have you noticed any hearing difficulties?: No  After whispering 9-1-6 how many numbers did the patient repeat correctly?: 3 (yes)   Fall Risk Screen       Vision Screen   Right Eye = 20: 30   Left Eye = 20: 30   Depression Screen           Pain Score        Substance Use-Abuse Screening     Tobacco Use     In Past 12 MONTHS, how often have you used any tobacco product (for example, cigarettes, e-cigarettes, cigars, pipes, or smokeless tobacco)?: Never     Alcohol use     In the PAST 12 MONTHS, how often have you had 5 (men)/4 (women) or more drinks containing alcohol in one day?: Never     Prescription Drug Use     In the PAST 12 months, how often have you used any prescription medications just for the feeling, more than prescribed, or that were not prescribed for you? Prescriptions may include: opioids, benzodiazepines, medications for ADHD: Never           Illicit Drug Use   In the PAST 12 MONTHS, how often have you used any drugs, including marijuana, cocaine or crack, heroin, methamphetamine, hallucinogens, ecstasy/MDMA?: Never           OBJECTIVE:   BP 120/74   Pulse 83   Temp 36.1 C (97 F) (Thermal Scan)  Resp 18   Ht 1.626 m (5\' 4" )   Wt 90.7 kg (200 lb)   SpO2 96%   BMI 34.33 kg/m        Other appropriate exam:    Health Maintenance Due   Topic Date Due    Medicare Annual Wellness Visit - Calendar Year Insurers  01/22/2022    Diabetic A1C  01/30/2022      ASSESSMENT & PLAN:   Assessment/Plan   1. Encounter for initial annual wellness  visit (AWV) in Medicare patient    2. Type 2 diabetes mellitus (CMS HCC)    3. 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 Hyperlipidemia, mixed    5. Primary hypertension    6. RLS (restless legs syndrome)    7. Diverticulosis of colon    8. Acquired hypothyroidism    9. Anxiety    10. Screening for colon cancer       Identified Risk Factors/ Recommended Actions         Urinary Incontinence Plan of Care: Behavioral interventions with bladder training, pelvic floor muscle training, and/or prompted voiding  Lifestyle modifications  Reassess at follow up visit        No orders of the defined types were placed in this encounter.         The patient has been educated about risk factors and recommended preventive care. Written Prevention Plan completed/ updated and given to patient (see After Visit Summary).    Return in about 1 year (around 02/08/2023).    Marcie Bal, LPN

## 2022-02-07 NOTE — Telephone Encounter (Signed)
Pt. Notiifed of the message from Dr. Blair Hailey. She verbalized understanding.

## 2022-02-07 NOTE — Result Encounter Note (Signed)
I have reviewed these results and no active changes need to be done at this time. Please keep your follow up appointment and continue your current medications. If any problems go to the ER.

## 2022-02-07 NOTE — Patient Instructions (Addendum)
Follow up with PCP   Make 1 year medicare wellness appointment   Continue taking medications as prescribed   Maintain healthy diet and exercise as tolerated   Continue with yearly eye and dental exams   Discuss with PCP need of bone density screening if needed  Medicare Preventive Services  Medicare coverage information Recommendation for YOU   Heart Disease and Diabetes   Lipid profile Every 5 years or more often if at risk for cardiovascular disease  Last Lipid Panel  (Last result in the past 2 years)      Cholesterol   HDL   LDL   Direct LDL   Triglycerides        06/28/21 1059 131   50   55  Comment: <100 mg/dL, Optimal  100-129 mg/dL, Near/Above Optimal  130-159 mg/dL, Borderline High  160-189 mg/dL, High  >=190 mg/dL, Very high     152            Diabetes Screening  yearly for those at risk for diabetes, 2 tests per year for those with prediabetes Last Glucose: 164    Diabetes Self Management Training or Medical Nutrition Therapy  For those with diabetes, up to 10 hrs initial training within a year, subsequent years up to 2 hrs of follow up training Optional for those with diabetes     Medical Nutrition Therapy Three hours of one-on-one counseling in first year, two hours in subsequent years Optional for those with diabetes, kidney disease   Intensive Behavioral Therapy for Obesity  Face-to-face counseling, first month every week, month 2-6 every other week, month 7-12 every month if continued progress is documented Optional for those with Body Mass Index 30 or higher  Your Body mass index is 34.33 kg/m.   Tobacco Cessation (Quitting) Counseling   Two attempts per year, max 4 sessions per attempt, up to 8 per year, for those with tobacco-related health condition Optional for those that use tobacco   Cancer Screening   Colorectal screening   For anyone age 64 to 68 or any age if high risk:  Screening Colonoscopy every 10 yrs if low risk,  more frequent if higher risk  OR  Cologuard Stool DNA test once every 3  years OR  Fecal Occult Blood Testing yearly OR  Flexible  Sigmoidoscopy  every 5 yr OR  CT Colonography every 5 yrs    See your schedule below   Screening Pap Test Recommended every 3 years for all women age 42 to 14, or every five years if combined with HPV test (routine screening not needed after total hysterectomy).  Medicare covers every 2 years, up to yearly if high risk.  Screening Pelvic Exam Medicare covers every 2 years, yearly if high risk or childbearing age with abnormal Pap in last 3 yrs. See your schedule below   Screening Mammogram   Recommended every 2 years for women age 33 to 54, or more frequent if you have a higher risk. Selectively recommended for women between 40-49 based on shared decisions about risk. Covered by Medicare up to every year for women age 76 or older See your schedule below      Lung Cancer Screening  Annual low dose computed tomography (LDCT scan) is recommended for those age 17-77 who smoked 20 pack-years and are current smokers or quit smoking within past 15 years (one pack-year= smoking one PPD for one year), after counseling by your doctor or nurse clinician about the possible benefits or harms.  See your schedule below   Vaccinations   Pneumococcal Vaccine: Recommended routinely age 86+ with one or two separate vaccines based on your risk    Recommended before age 47 if medical conditions with increased risk  Seasonal Influenza Vaccine: Once every flu season   Hepatitis B Vaccine: 3 doses if risk (including anyone with diabetes or liver disease)  Shingles Vaccine: Two doses at age 2 or older  Diphtheria Tetanus Pertussis Vaccine: ONCE as adult, booster every 10 years     Immunization History   Administered Date(s) Administered   . Covid-19 Vaccine,Moderna(Spikevax),41mcg/0.5mL(2023-2024),12 yr+ 12/27/2021   . Covid-19 Vaccine,Pfizer Bivalent,21mcg/0.3ml,12 yrs+ 01/30/2021   . Covid-19 Vaccine,Pfizer-BioNTech,Purple Top,34yrs+ 03/28/2019, 04/16/2019, 11/16/2019, 05/23/2020    . Flucelvax Influenza Vaccine, 6 months + 11/30/2019   . Influenza Vaccine, 6 month-adult 01/03/2004, 12/06/2010, 10/22/2017, 10/21/2018   . Influenza Vaccine, 65+ 10/25/2020, 10/30/2021   . PREVNAR 20 10/18/2020   . Pneumovax 03/10/2012   . Shingrix - Zoster Vaccine 02/26/2019, 06/16/2019   . Tetanus Toxoid/Diphtheria Toxoid/Acellular Pertussis Vaccine, Adsorbed (Tdap) 01/23/2019     Shingles vaccine and Diphtheria Tetanus Pertussis vaccines are available at pharmacies or local health department without a prescription.   Other Screening   Bone Densitometry   Every 24 months for anyone at risk, including postmenopausal       Glaucoma Screening   Yearly if in high risk group such as diabetes, family history, African American age 64+ or Hispanic American age 43+   See your Eye Care Provider   Hepatitis C Screening recommended ONCE for those born between 1945-1965, or high risk for HCV infection       HIV Testing recommended routinely at least ONCE, covered every year for age 65 to 13 regardless of risk, and every year for age over 31 who ask for the test or higher risk  Yearly or up to 3 times in pregnancy         Abdominal Aortic Aneurysm Screening Ultrasound   Once between the age of 39-75 with a family history of AAA       Your Personalized Schedule for Preventive Tests   Health Maintenance: Pending and Last Completed       Date Due Completion Date    Diabetic A1C 01/30/2022 10/30/2021    Diabetic Kidney Health eGFR 12/12/2022 12/11/2021    Diabetic Kidney Health Microalb/Cr Ratio 12/12/2022 12/11/2021    Diabetic Retinal Exam 01/04/2023 01/03/2022    Mammography 01/09/2024 01/08/2022    Colonoscopy 10/02/2028 10/03/2018    Adult Tdap-Td (2 - Td or Tdap) 01/22/2029 01/23/2019    Osteoporosis screening 06/10/2030 06/09/2020

## 2022-02-07 NOTE — Nursing Note (Signed)
Pt here for follow up after having her CT scan. Needs refill on gabapentin. Pt is feeling some better after having bronchitis. Still has a dry cough.

## 2022-02-12 ENCOUNTER — Other Ambulatory Visit (INDEPENDENT_AMBULATORY_CARE_PROVIDER_SITE_OTHER): Payer: Self-pay | Admitting: FAMILY MEDICINE

## 2022-03-09 ENCOUNTER — Other Ambulatory Visit (INDEPENDENT_AMBULATORY_CARE_PROVIDER_SITE_OTHER): Payer: Self-pay | Admitting: FAMILY MEDICINE

## 2022-03-09 DIAGNOSIS — G8929 Other chronic pain: Secondary | ICD-10-CM

## 2022-03-14 ENCOUNTER — Other Ambulatory Visit (INDEPENDENT_AMBULATORY_CARE_PROVIDER_SITE_OTHER): Payer: Self-pay | Admitting: FAMILY MEDICINE

## 2022-03-14 MED ORDER — LEVOTHYROXINE 100 MCG TABLET
ORAL_TABLET | ORAL | 1 refills | Status: DC
Start: 2022-03-14 — End: 2022-06-13

## 2022-04-09 ENCOUNTER — Other Ambulatory Visit (INDEPENDENT_AMBULATORY_CARE_PROVIDER_SITE_OTHER): Payer: Self-pay | Admitting: FAMILY MEDICINE

## 2022-04-28 ENCOUNTER — Other Ambulatory Visit (INDEPENDENT_AMBULATORY_CARE_PROVIDER_SITE_OTHER): Payer: Self-pay | Admitting: FAMILY MEDICINE

## 2022-04-29 ENCOUNTER — Telehealth (INDEPENDENT_AMBULATORY_CARE_PROVIDER_SITE_OTHER): Payer: Self-pay | Admitting: FAMILY MEDICINE

## 2022-04-29 NOTE — Telephone Encounter (Signed)
Please check with this patient if she is currently on any statins. If not would she be willing to try Pravachol. I do see where she has tried Zocor,Crestor and Lipitor and had myalgia related to that. Sometimes patients can tolerate another statin without any problems. Her atherosclerotic cardiovascular risk score is 13.1% with the recommendation for a statin if greater than 7.5%. The 10-year ASCVD risk score (Arnett DK, et al., 2019) is: 13.1%    Values used to calculate the score:      Age: 67 years      Sex: Female      Is Non-Hispanic African American: No      Diabetic: Yes      Tobacco smoker: No      Systolic Blood Pressure: 120 mmHg      Is BP treated: Yes      HDL Cholesterol: 50 mg/dL      Total Cholesterol: 131 mg/dL

## 2022-04-30 ENCOUNTER — Telehealth (INDEPENDENT_AMBULATORY_CARE_PROVIDER_SITE_OTHER): Payer: Self-pay | Admitting: FAMILY MEDICINE

## 2022-04-30 NOTE — Telephone Encounter (Signed)
No answer

## 2022-05-01 MED ORDER — PRAVASTATIN 10 MG TABLET
10.00 mg | ORAL_TABLET | Freq: Every evening | ORAL | 1 refills | Status: AC
Start: 2022-05-01 — End: ?

## 2022-05-01 NOTE — Telephone Encounter (Signed)
Pravachol was sent to her pharmacy. If she has any problems with it stop it and let me know.

## 2022-05-01 NOTE — Telephone Encounter (Signed)
Pt notified of the message from Dr. Glory Rosebush. She verbalized understanding. She is willing to try the Pravachol. She is not currently taking any statins. Please send to [facility]

## 2022-05-08 ENCOUNTER — Other Ambulatory Visit (INDEPENDENT_AMBULATORY_CARE_PROVIDER_SITE_OTHER): Payer: Self-pay | Admitting: FAMILY MEDICINE

## 2022-05-08 MED ORDER — GABAPENTIN 600 MG TABLET
600.0000 mg | ORAL_TABLET | Freq: Three times a day (TID) | ORAL | 0 refills | Status: DC
Start: 2022-05-08 — End: 2022-06-07

## 2022-05-08 NOTE — Telephone Encounter (Signed)
Pt called for a refill of medication.    Tereso Newcomer, [place]

## 2022-05-13 NOTE — Progress Notes (Unsigned)
Family Medicine, [facility]  [address]  [place] [address]  [phone]      Patient Name:  Gwendolyn Crawford  MRN:  [id number]  DOB:  07-28-1955    Date of Service:  05/14/2022    Chief Complaint: No chief complaint on file.      Subjective:  Gwendolyn Crawford is a 67 y.o. female who returns     Past Medical History:  Past Medical History:   Diagnosis Date    Allergic rhinitis     Anxiety     Arthritis     Depression     Diabetes mellitus, type 2 (CMS HCC)     Encounter for initial annual wellness visit (AWV) in Medicare patient 10/05/2020    Gout     Headache     Hypercholesterolemia     Hypertension     Hypothyroid     Hypothyroidism     Insomnia     PONV (postoperative nausea and vomiting)     RLS (restless legs syndrome)     Vitamin deficiency     Wears glasses             Past Surgical History:  Past Surgical History:   Procedure Laterality Date    COLONOSCOPY      ESOPHAGOGASTRODUODENOSCOPY      HX BREAST BIOPSY Left     HX BENIGN BREAST BX UNSURE WHICH SIDE    HX CHOLECYSTECTOMY      HX DENTAL EXTRACTION              Review of Systems:  Review of Systems   Constitutional: Negative for fever.   HENT: Negative for sore throat.    Eyes: Negative for blurred vision.   Respiratory: Negative for cough and shortness of breath.    Cardiovascular: Negative for chest pain.   Gastrointestinal: Negative for abdominal pain, nausea and vomiting.   Genitourinary: Negative for dysuria.   Musculoskeletal: Negative for falls.   Skin: Negative for rash.   Neurological: Negative for headaches.         Current Outpatient Medications   Medication Sig    acetaminophen (TYLENOL) 325 mg Oral Tablet Take 1 Tablet (325 mg total) by mouth Every 4 hours as needed for Pain    allopurinoL (ZYLOPRIM) 100 mg Oral Tablet TAKE ONE TABLET BY MOUTH TWO TIMES A DAY    ascorbic acid (VITAMIN C) 1,000 mg Oral Tablet Take 1 Tablet (1,000 mg total) by mouth Once a day    aspirin (ECOTRIN) 81 mg Oral Tablet, Delayed Release  (E.C.) Take 1 Tablet (81 mg total) by mouth Once a day Pt reports Taking once weekly    cholecalciferol, vitamin D3, 25 mcg (1,000 unit) Oral Tablet Take 1 Tablet (1,000 Units total) by mouth Once a day    cinnamon bark (CINNAMON ORAL) Take 1,000 mg by mouth bid    CINNAMON BARK EXTRACT ORAL Take 1,000 mg by mouth Twice daily    cyanocobalamin (VITAMIN B 12) 1,000 mcg Oral Tablet Take 1 Tablet (1,000 mcg total) by mouth Once a day    DULoxetine (CYMBALTA DR) 20 mg Oral Capsule, Delayed Release(E.C.) TAKE ONE CAPSULE BY MOUTH EVERY DAY    gabapentin (NEURONTIN) 600 mg Oral Tablet Take 1 Tablet (600 mg total) by mouth Three times a day    glimepiride (AMARYL) 1 mg Oral Tablet Take 0.5 Tablets (0.5 mg total) by mouth Every morning with breakfast Indications: type 2 diabetes mellitus  levothyroxine (SYNTHROID) 100 mcg Oral Tablet TAKE 1 TABLET BY MOUTH ONCE DAILY ON AN EMPTY STOMACH FOR THYROID    lisinopriL (PRINIVIL) 30 mg Oral Tablet Take 1 Tablet (30 mg total) by mouth Once a day for 180 days for blood pressure Indications: high blood pressure, 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    loratadine (CLARITIN) 10 mg Oral Tablet Take 1 Tablet (10 mg total) by mouth Once a day    meclizine (ANTIVERT) 25 mg Oral Tablet Take 1 Tablet (25 mg total) by mouth Every 8 hours as needed    metFORMIN (GLUCOPHAGE) 500 mg Oral Tablet TAKE TWO TABLETS BY MOUTH TWO TIMES A DAY WITH FOOD FOR BLOOD SUGAR Indications: type 2 diabetes mellitus    naproxen Sodium (ALEVE) 220 mg Oral Tablet Take 1 Tablet (220 mg total) by mouth Every 12 hours as needed    pravastatin (PRAVACHOL) 10 mg Oral Tablet Take 1 Tablet (10 mg total) by mouth Every evening Indications: excessive fat in the blood    triamcinolone acetonide (ARISTOCORT) 0.5 % Cream     turmeric (CURCUMIN N/A) 1 Capsule Once a day        Allergies   Allergen Reactions    Zocor [Simvastatin] Myalgia    Crestor [Rosuvastatin]  Other Adverse Reaction (Add comment) and Myalgia     unknown     Lipitor [Atorvastatin] Myalgia       Objective:    There were no vitals taken for this visit.      There is no height or weight on file to calculate BMI.     Physical Exam:  Constitutional: she is oriented to person, place, and time and well-developed, well-nourished, and in no distress.   HENT:   Head: Normocephalic.   Eyes: Pupils are equal, round, and reactive to light. Conjunctivae and EOM are normal. Right eye exhibits no discharge. Left eye exhibits no discharge.   Neck: Normal range of motion. Neck supple. No thyromegaly present.   Cardiovascular: Normal rate, regular rhythm, normal heart sounds and intact distal pulses. Exam reveals no friction rub.   No murmur heard.  Pulmonary/Chest: Breath sounds normal. No respiratory distress. she  has no wheezes. she  has no rales.   Musculoskeletal:         General: No tenderness or edema.   Lymphadenopathy:     she  has no cervical adenopathy.   Neurological she  is alert and oriented to person, place, and time.   Skin: Skin is warm and dry.   Psychiatric: Affect normal.    Diabetic foot exam:  No edema bilaterally. No ulcerations or lesions bilaterally. Pulses normal bilaterally.       Data reviewed:  A1C: 7.6  A1C Date: 02/07/2022  BASIC METABOLIC PANEL  Lab Results   Component Value Date    SODIUM 141 12/11/2021    POTASSIUM 4.2 12/11/2021    CHLORIDE 104 12/11/2021    CO2 25 12/11/2021    ANIONGAP 12 12/11/2021    BUN 12 12/11/2021    CREATININE 0.76 12/11/2021    BUNCRRATIO 16 12/11/2021    GFR 86 12/11/2021    CALCIUM 10.1 12/11/2021    GLUCOSENF 164 (H) 02/10/2019         Recent Results (from the past 10272 hour(s))   MAMMO BILATERAL SCREENING-ADDL VIEWS/BREAST US AS REQ BY RAD    Collection Time: 01/08/22 10:52 AM    Narrative    Computer aided detection (CAD) technology has been applied to the standard   (  CC and MLO) images.    3D tomosynthesis and 2D images were acquired and reviewed.    FILMS COMPARED:  Compared to: 01/05/2021 MAMMO BILATERAL  SCREENING-ADDL VIEWS/BREAST US AS   REQ BY RAD, 01/04/2020 MAMMO BILATERAL SCREENING-ADDL VIEWS/BREAST US AS   REQ BY RAD, 10/27/2018 MAMMO BILATERAL SCREENING-ADDL VIEWS/BREAST US AS   REQ BY RAD, and 10/24/2017 MAMMO BILAT SCREENING WO TOMO-ADDL VIEWS/BREAST   US AS REQ BY RAD    HISTORY:  Procedure: MAMMO BILATERAL SCREENING W TOMO-ADDL VIEWS/BREAST US AS REQ BY   RAD  Reason for exam: Breast cancer screening by mammogram       FINDINGS:  The breasts are almost entirely fatty.    Bilateral  There is no evidence of suspicious masses, calcifications, or other   abnormal findings.      Impression    Follow up mammogram in 1 year is recommended for both breasts.    BI-RADS ATLAS category (overall): 1 - Negative    MFL          Social Determinants identified with potential adverse effects on health outcomes:  none           Assessment/Plan:  No diagnosis found.       Plan:        Treatment plan was discussed with patient and shared decision making employed. The patient was encouraged to call with any additional questions or concerns.     Discussed with patient effects and side effects of medications. Medication safety was discussed. A good faith effort was made to reconcile the patient's medications.   No orders of the defined types were placed in this encounter.        Follow up:  No follow-ups on file.    This note was partially generated using MModal Fluency Direct system, and there may be some incorrect words, spellings, and punctuation that were not noted in checking the note before saving.    Leonie Man, MD  05/13/2022, 18:37

## 2022-05-14 ENCOUNTER — Ambulatory Visit: Payer: Medicare Other | Attending: FAMILY MEDICINE | Admitting: FAMILY MEDICINE

## 2022-05-14 ENCOUNTER — Encounter (INDEPENDENT_AMBULATORY_CARE_PROVIDER_SITE_OTHER): Payer: Self-pay | Admitting: FAMILY MEDICINE

## 2022-05-14 ENCOUNTER — Other Ambulatory Visit (INDEPENDENT_AMBULATORY_CARE_PROVIDER_SITE_OTHER): Payer: Self-pay | Admitting: FAMILY MEDICINE

## 2022-05-14 ENCOUNTER — Other Ambulatory Visit: Payer: Self-pay

## 2022-05-14 VITALS — BP 120/80 | HR 84 | Temp 97.0°F | Resp 18 | Ht 64.0 in | Wt 202.0 lb

## 2022-05-14 DIAGNOSIS — I10 Essential (primary) hypertension: Secondary | ICD-10-CM

## 2022-05-14 DIAGNOSIS — E039 Hypothyroidism, unspecified: Secondary | ICD-10-CM

## 2022-05-14 DIAGNOSIS — F325 Major depressive disorder, single episode, in full remission: Secondary | ICD-10-CM | POA: Insufficient documentation

## 2022-05-14 DIAGNOSIS — E782 Mixed hyperlipidemia: Secondary | ICD-10-CM

## 2022-05-14 DIAGNOSIS — G8929 Other chronic pain: Secondary | ICD-10-CM | POA: Insufficient documentation

## 2022-05-14 DIAGNOSIS — G2581 Restless legs syndrome: Secondary | ICD-10-CM | POA: Insufficient documentation

## 2022-05-14 DIAGNOSIS — E1142 Type 2 diabetes mellitus with diabetic polyneuropathy: Secondary | ICD-10-CM | POA: Insufficient documentation

## 2022-05-14 DIAGNOSIS — Z7984 Long term (current) use of oral hypoglycemic drugs: Secondary | ICD-10-CM | POA: Insufficient documentation

## 2022-05-14 LAB — CBC WITH DIFF
BASOPHIL #: <0.1 10*3/uL (ref <=0.20)
BASOPHIL %: 1 %
EOSINOPHIL #: <0.1 10*3/uL (ref <=0.50)
EOSINOPHIL %: 1 %
HCT: 42 % (ref 34.8–46.0)
HGB: 14 g/dL (ref 11.5–16.0)
IMMATURE GRANULOCYTE #: <0.1 10*3/uL (ref <0.10)
IMMATURE GRANULOCYTE %: 0 % (ref 0.0–1.0)
LYMPHOCYTE #: 3.05 10*3/uL (ref 1.00–4.80)
LYMPHOCYTE %: 46 %
MCH: 30 pg (ref 26.0–32.0)
MCHC: 33.3 g/dL (ref 31.0–35.5)
MCV: 89.9 fL (ref 78.0–100.0)
MONOCYTE #: 0.37 10*3/uL (ref 0.20–1.10)
MONOCYTE %: 6 %
MPV: 10.4 fL (ref 8.7–12.5)
NEUTROPHIL #: 3.03 10*3/uL (ref 1.50–7.70)
NEUTROPHIL %: 46 %
PLATELETS: 240 10*3/uL (ref 150–400)
RBC: 4.67 10*6/uL (ref 3.85–5.22)
RDW-CV: 13.2 % (ref 11.5–15.5)
WBC: 6.6 10*3/uL (ref 3.7–11.0)

## 2022-05-14 LAB — COMPREHENSIVE METABOLIC PNL, FASTING
ALBUMIN: 4.2 g/dL (ref 3.4–4.8)
ALKALINE PHOSPHATASE: 54 U/L — ABNORMAL LOW (ref 55–145)
ALT (SGPT): 25 U/L — ABNORMAL HIGH (ref 8–22)
ANION GAP: 9 mmol/L (ref 4–13)
AST (SGOT): 26 U/L (ref 8–45)
BILIRUBIN TOTAL: 0.9 mg/dL (ref 0.3–1.3)
BUN/CREA RATIO: 11 (ref 6–22)
BUN: 8 mg/dL (ref 8–25)
CALCIUM: 9.6 mg/dL (ref 8.6–10.3)
CHLORIDE: 106 mmol/L (ref 96–111)
CO2 TOTAL: 26 mmol/L (ref 23–31)
CREATININE: 0.76 mg/dL (ref 0.60–1.05)
ESTIMATED GFR - FEMALE: 86 mL/min/BSA (ref >=60)
GLUCOSE: 106 mg/dL — ABNORMAL HIGH (ref 70–99)
POTASSIUM: 3.8 mmol/L (ref 3.5–5.1)
PROTEIN TOTAL: 7.2 g/dL (ref 6.0–8.0)
SODIUM: 141 mmol/L (ref 136–145)

## 2022-05-14 LAB — HGA1C (HEMOGLOBIN A1C WITH EST AVG GLUCOSE)
ESTIMATED AVERAGE GLUCOSE: 157 mg/dL
HEMOGLOBIN A1C: 7.1 % — ABNORMAL HIGH (ref 4.0–6.0)

## 2022-05-14 LAB — THYROID STIMULATING HORMONE (SENSITIVE TSH): TSH: 3.242 u[IU]/mL (ref 0.350–4.940)

## 2022-05-14 LAB — LIPID PANEL
CHOL/HDL RATIO: 4.3
CHOLESTEROL: 202 mg/dL — ABNORMAL HIGH (ref 100–200)
HDL CHOL: 47 mg/dL — ABNORMAL LOW (ref >=50)
LDL CALC: 113 mg/dL — ABNORMAL HIGH (ref <100)
NON-HDL: 155 mg/dL (ref <=190)
TRIGLYCERIDES: 245 mg/dL — ABNORMAL HIGH (ref <150)
VLDL CALC: 42 mg/dL — ABNORMAL HIGH (ref <30)

## 2022-05-14 MED ORDER — METFORMIN 500 MG TABLET
ORAL_TABLET | ORAL | 1 refills | Status: DC
Start: 2022-05-14 — End: 2023-02-12

## 2022-05-14 NOTE — Nursing Note (Signed)
Pt here for check up, reports not feeling well for the past 3 weeks- not much of an appetite, head and eyes hurt, denies any nausea, vomiting or diarrhea, and no fever.

## 2022-05-14 NOTE — Nursing Note (Signed)
Venipuncture performed in office on right arm antecubital vein, dry pressure dressing was applied to site and patient tolerated it well.  Specimen was centrifuged, aliquoted as needed and specimen was labeled and packaged for transport.    Jaimya Feliciano, MA

## 2022-05-15 ENCOUNTER — Telehealth (INDEPENDENT_AMBULATORY_CARE_PROVIDER_SITE_OTHER): Payer: Self-pay | Admitting: FAMILY MEDICINE

## 2022-05-15 DIAGNOSIS — R7401 Elevation of levels of liver transaminase levels: Secondary | ICD-10-CM

## 2022-05-15 NOTE — Telephone Encounter (Signed)
Please let this patient know the results of her labs. Her A1c of 7.1 is better. Continue her meds like she has been. Her LDL was 113 and goal is less than 100. Check to see if she has been taking her Pravachol and tolerating it. Also her ALT was slightly elevated at 25. Repeat if in 2-4 weeks. Check to see if she has had any abdominal pain, nausea,vomiting or other GI issues. Encourage the patient with diet,exercise and weight loss and that will also help with all three labs that are slightly off.

## 2022-05-16 NOTE — Telephone Encounter (Signed)
Dr. Greta Doom: spoke with pt and notified her of this information/these results and instructions - discussed. She voiced understanding. States she's been tolerating the Pravachol well and taking it. Plans to have repeat labs at f/up appt with you. Denies any GI symptoms, abdominal pain, nausea, or vomiting. Titus Mould, RN

## 2022-05-16 NOTE — Telephone Encounter (Signed)
Left message on voicemail. Instructed patient to call me back.Jonanthan Bolender, RN

## 2022-05-23 ENCOUNTER — Other Ambulatory Visit (INDEPENDENT_AMBULATORY_CARE_PROVIDER_SITE_OTHER): Payer: Self-pay | Admitting: FAMILY MEDICINE

## 2022-06-07 ENCOUNTER — Other Ambulatory Visit (INDEPENDENT_AMBULATORY_CARE_PROVIDER_SITE_OTHER): Payer: Self-pay | Admitting: FAMILY MEDICINE

## 2022-06-09 NOTE — Progress Notes (Unsigned)
Family Medicine, [facility]  [address]  [place] [address]  [phone]      Patient Name:  Gwendolyn Crawford  MRN:  [id number]  DOB:  05-31-1955    Date of Service:  06/11/2022    Chief Complaint: No chief complaint on file.      Subjective:  Gwendolyn Crawford is a 67 y.o. female who returns ***    Past Medical History:  Past Medical History:   Diagnosis Date    Allergic rhinitis     Anxiety     Arthritis     Depression     Diabetes mellitus, type 2 (CMS HCC)     Encounter for initial annual wellness visit (AWV) in Medicare patient 10/05/2020    Gout     Headache     Hypercholesterolemia     Hypertension     Hypothyroid     Hypothyroidism     Insomnia     PONV (postoperative nausea and vomiting)     RLS (restless legs syndrome)     Vitamin deficiency     Wears glasses             Past Surgical History:  Past Surgical History:   Procedure Laterality Date    COLONOSCOPY      ESOPHAGOGASTRODUODENOSCOPY      HX BREAST BIOPSY Left     HX BENIGN BREAST BX UNSURE WHICH SIDE    HX CHOLECYSTECTOMY      HX DENTAL EXTRACTION              Review of Systems:  Review of Systems   Constitutional: Negative for fever.   HENT: Negative for sore throat.    Eyes: Negative for blurred vision.   Respiratory: Negative for cough and shortness of breath.    Cardiovascular: Negative for chest pain.   Gastrointestinal: Negative for abdominal pain, nausea and vomiting.   Genitourinary: Negative for dysuria.   Musculoskeletal: Negative for falls.   Skin: Negative for rash.   Neurological: Negative for headaches.         Current Outpatient Medications   Medication Sig    acetaminophen (TYLENOL) 325 mg Oral Tablet Take 1 Tablet (325 mg total) by mouth Every 4 hours as needed for Pain    allopurinoL (ZYLOPRIM) 100 mg Oral Tablet TAKE ONE TABLET BY MOUTH TWO TIMES A DAY    ascorbic acid (VITAMIN C) 1,000 mg Oral Tablet Take 1 Tablet (1,000 mg total) by mouth Once a day    aspirin (ECOTRIN) 81 mg Oral Tablet, Delayed  Release (E.C.) Take 1 Tablet (81 mg total) by mouth Once a day Pt reports Taking once weekly    cholecalciferol, vitamin D3, 25 mcg (1,000 unit) Oral Tablet Take 1 Tablet (1,000 Units total) by mouth Once a day    cinnamon bark (CINNAMON ORAL) Take 1,000 mg by mouth bid    CINNAMON BARK EXTRACT ORAL Take 1,000 mg by mouth Twice daily    cyanocobalamin (VITAMIN B 12) 1,000 mcg Oral Tablet Take 1 Tablet (1,000 mcg total) by mouth Once a day    DULoxetine (CYMBALTA DR) 20 mg Oral Capsule, Delayed Release(E.C.) TAKE ONE CAPSULE BY MOUTH EVERY DAY    gabapentin (NEURONTIN) 600 mg Oral Tablet TAKE ONE TABLET BY MOUTH THREE TIMES A DAY    glimepiride (AMARYL) 1 mg Oral Tablet Take 0.5 Tablets (0.5 mg total) by mouth Every morning with breakfast Indications: type 2 diabetes mellitus    levothyroxine (  SYNTHROID) 100 mcg Oral Tablet TAKE 1 TABLET BY MOUTH ONCE DAILY ON AN EMPTY STOMACH FOR THYROID    lisinopriL (PRINIVIL) 30 mg Oral Tablet Take 1 Tablet (30 mg total) by mouth Once a day for 180 days for blood pressure Indications: high blood pressure, 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    loratadine (CLARITIN) 10 mg Oral Tablet Take 1 Tablet (10 mg total) by mouth Once a day    meclizine (ANTIVERT) 25 mg Oral Tablet Take 1 Tablet (25 mg total) by mouth Every 8 hours as needed    metFORMIN (GLUCOPHAGE) 500 mg Oral Tablet TAKE TWO TABLETS BY MOUTH TWO TIMES A DAY WITH FOOD FOR BLOOD SUGAR Indications: type 2 diabetes mellitus    naproxen Sodium (ALEVE) 220 mg Oral Tablet Take 1 Tablet (220 mg total) by mouth Every 12 hours as needed    pravastatin (PRAVACHOL) 10 mg Oral Tablet Take 1 Tablet (10 mg total) by mouth Every evening Indications: excessive fat in the blood    triamcinolone acetonide (ARISTOCORT) 0.5 % Cream     turmeric (CURCUMIN N/A) 1 Capsule Once a day 500mg        Allergies   Allergen Reactions    Zocor [Simvastatin] Myalgia    Crestor [Rosuvastatin]  Other Adverse Reaction (Add comment) and Myalgia     unknown     Lipitor [Atorvastatin] Myalgia       Objective:    There were no vitals taken for this visit.      There is no height or weight on file to calculate BMI.     Physical Exam:  Constitutional: she is oriented to person, place, and time and well-developed, well-nourished, and in no distress.   HENT:   Head: Normocephalic.   Eyes: Pupils are equal, round, and reactive to light. Conjunctivae and EOM are normal. Right eye exhibits no discharge. Left eye exhibits no discharge.   Neck: Normal range of motion. Neck supple. No thyromegaly present.   Cardiovascular: Normal rate, regular rhythm, normal heart sounds and intact distal pulses. Exam reveals no friction rub.   No murmur heard.  Pulmonary/Chest: Breath sounds normal. No respiratory distress. she  has no wheezes. she  has no rales.   Musculoskeletal:         General: No tenderness or edema.   Lymphadenopathy:     she  has no cervical adenopathy.   Neurological she  is alert and oriented to person, place, and time.   Skin: Skin is warm and dry.   Psychiatric: Affect normal.    Diabetic foot exam:  No edema bilaterally. No ulcerations or lesions bilaterally. Pulses normal bilaterally.       Data reviewed:  A1C: 7.1  A1C Date: 05/14/2022  BASIC METABOLIC PANEL  Lab Results   Component Value Date    SODIUM 141 05/14/2022    POTASSIUM 3.8 05/14/2022    CHLORIDE 106 05/14/2022    CO2 26 05/14/2022    ANIONGAP 9 05/14/2022    BUN 8 05/14/2022    CREATININE 0.76 05/14/2022    BUNCRRATIO 11 05/14/2022    GFR 86 05/14/2022    CALCIUM 9.6 05/14/2022    GLUCOSENF 164 (H) 02/10/2019         Recent Results (from the past 16109 hour(s))   MAMMO BILATERAL SCREENING-ADDL VIEWS/BREAST US AS REQ BY RAD    Collection Time: 01/08/22 10:52 AM    Narrative    Computer aided detection (CAD) technology has been applied to the standard   (  CC and MLO) images.    3D tomosynthesis and 2D images were acquired and reviewed.    FILMS COMPARED:  Compared to: 01/05/2021 MAMMO BILATERAL SCREENING-ADDL  VIEWS/BREAST US AS   REQ BY RAD, 01/04/2020 MAMMO BILATERAL SCREENING-ADDL VIEWS/BREAST US AS   REQ BY RAD, 10/27/2018 MAMMO BILATERAL SCREENING-ADDL VIEWS/BREAST US AS   REQ BY RAD, and 10/24/2017 MAMMO BILAT SCREENING WO TOMO-ADDL VIEWS/BREAST   US AS REQ BY RAD    HISTORY:  Procedure: MAMMO BILATERAL SCREENING W TOMO-ADDL VIEWS/BREAST US AS REQ BY   RAD  Reason for exam: Breast cancer screening by mammogram       FINDINGS:  The breasts are almost entirely fatty.    Bilateral  There is no evidence of suspicious masses, calcifications, or other   abnormal findings.      Impression    Follow up mammogram in 1 year is recommended for both breasts.    BI-RADS ATLAS category (overall): 1 - Negative    MFL          Social Determinants identified with potential adverse effects on health outcomes:  none           Assessment/Plan:  No diagnosis found.       Plan:        Treatment plan was discussed with patient and shared decision making employed. The patient was encouraged to call with any additional questions or concerns.     Discussed with patient effects and side effects of medications. Medication safety was discussed. A good faith effort was made to reconcile the patient's medications.   No orders of the defined types were placed in this encounter.        Follow up:  No follow-ups on file.    This note was partially generated using MModal Fluency Direct system, and there may be some incorrect words, spellings, and punctuation that were not noted in checking the note before saving.    Leonie Man, MD  06/09/2022, 21:11

## 2022-06-11 ENCOUNTER — Inpatient Hospital Stay (HOSPITAL_COMMUNITY)
Admission: RE | Admit: 2022-06-11 | Discharge: 2022-06-11 | Disposition: A | Discharge status: Home / Self Care | Payer: Medicare Other | Source: Ambulatory Visit | Attending: FAMILY MEDICINE

## 2022-06-11 ENCOUNTER — Encounter (INDEPENDENT_AMBULATORY_CARE_PROVIDER_SITE_OTHER): Payer: Self-pay | Admitting: FAMILY MEDICINE

## 2022-06-11 ENCOUNTER — Other Ambulatory Visit: Payer: Self-pay

## 2022-06-11 ENCOUNTER — Ambulatory Visit: Payer: Medicare Other | Attending: FAMILY MEDICINE | Admitting: FAMILY MEDICINE

## 2022-06-11 VITALS — BP 142/80 | HR 73 | Temp 97.1°F | Resp 16 | Ht 64.0 in | Wt 202.2 lb

## 2022-06-11 DIAGNOSIS — R7401 Elevation of levels of liver transaminase levels: Secondary | ICD-10-CM

## 2022-06-11 DIAGNOSIS — E1142 Type 2 diabetes mellitus with diabetic polyneuropathy: Secondary | ICD-10-CM | POA: Insufficient documentation

## 2022-06-11 DIAGNOSIS — E039 Hypothyroidism, unspecified: Secondary | ICD-10-CM

## 2022-06-11 DIAGNOSIS — Z6834 Body mass index (BMI) 34.0-34.9, adult: Secondary | ICD-10-CM | POA: Insufficient documentation

## 2022-06-11 DIAGNOSIS — M8580 Other specified disorders of bone density and structure, unspecified site: Secondary | ICD-10-CM | POA: Insufficient documentation

## 2022-06-11 DIAGNOSIS — I10 Essential (primary) hypertension: Secondary | ICD-10-CM

## 2022-06-11 DIAGNOSIS — F3341 Major depressive disorder, recurrent, in partial remission: Secondary | ICD-10-CM | POA: Insufficient documentation

## 2022-06-11 DIAGNOSIS — G2581 Restless legs syndrome: Secondary | ICD-10-CM

## 2022-06-11 DIAGNOSIS — E669 Obesity, unspecified: Secondary | ICD-10-CM | POA: Insufficient documentation

## 2022-06-11 DIAGNOSIS — E782 Mixed hyperlipidemia: Secondary | ICD-10-CM

## 2022-06-11 DIAGNOSIS — Z79899 Other long term (current) drug therapy: Secondary | ICD-10-CM | POA: Insufficient documentation

## 2022-06-11 DIAGNOSIS — M85852 Other specified disorders of bone density and structure, left thigh: Secondary | ICD-10-CM | POA: Insufficient documentation

## 2022-06-11 LAB — ALT (SGPT): ALT (SGPT): 25 U/L — ABNORMAL HIGH (ref 8–22)

## 2022-06-11 LAB — LDL CHOLESTEROL, DIRECT: LDL DIRECT: 124 mg/dL — ABNORMAL HIGH (ref <100)

## 2022-06-11 MED ORDER — DULOXETINE 30 MG CAPSULE,DELAYED RELEASE
30.0000 mg | DELAYED_RELEASE_CAPSULE | Freq: Every day | ORAL | 1 refills | Status: DC
Start: 2022-06-11 — End: 2022-12-03

## 2022-06-13 ENCOUNTER — Other Ambulatory Visit (INDEPENDENT_AMBULATORY_CARE_PROVIDER_SITE_OTHER): Payer: Self-pay | Admitting: FAMILY MEDICINE

## 2022-06-14 ENCOUNTER — Ambulatory Visit (HOSPITAL_COMMUNITY): Payer: Self-pay

## 2022-06-15 ENCOUNTER — Telehealth (INDEPENDENT_AMBULATORY_CARE_PROVIDER_SITE_OTHER): Payer: Self-pay | Admitting: FAMILY MEDICINE

## 2022-06-15 DIAGNOSIS — R7401 Elevation of levels of liver transaminase levels: Secondary | ICD-10-CM

## 2022-06-15 NOTE — Telephone Encounter (Signed)
Left message on voicemail and instructed patient to call me back. Anali Cabanilla, RN

## 2022-06-15 NOTE — Telephone Encounter (Addendum)
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 Check to see if she is taking calcium and Vit D and continue doing so along with exercise. Also her LDL was 124. Check to see if she has been taking Pravachol and for how long. Also her ALT was slightly elevated at 25 but stable. Her last RUQ [place] was 2 years ago. If ok with the patient will recheck it.

## 2022-06-19 ENCOUNTER — Telehealth (INDEPENDENT_AMBULATORY_CARE_PROVIDER_SITE_OTHER): Payer: Self-pay | Admitting: FAMILY MEDICINE

## 2022-06-19 NOTE — Telephone Encounter (Signed)
Called and spoke to patient and gave her the appt for the [place] ruq and it is scheduled for June 5th @ 7:30 and arrive 30 min early to register and NPO after midnight the night before  Comments added by Jill Alexanders, Office Staff on 06/19/22 at 15:53.

## 2022-06-27 ENCOUNTER — Inpatient Hospital Stay
Admission: RE | Admit: 2022-06-27 | Discharge: 2022-06-27 | Disposition: A | Discharge status: Home / Self Care | Payer: Medicare Other | Source: Ambulatory Visit | Attending: FAMILY MEDICINE | Admitting: FAMILY MEDICINE

## 2022-06-27 ENCOUNTER — Other Ambulatory Visit: Payer: Self-pay

## 2022-06-27 DIAGNOSIS — K760 Fatty (change of) liver, not elsewhere classified: Secondary | ICD-10-CM | POA: Insufficient documentation

## 2022-06-27 DIAGNOSIS — R7401 Elevation of levels of liver transaminase levels: Secondary | ICD-10-CM | POA: Insufficient documentation

## 2022-07-01 ENCOUNTER — Telehealth (INDEPENDENT_AMBULATORY_CARE_PROVIDER_SITE_OTHER): Payer: Self-pay | Admitting: FAMILY MEDICINE

## 2022-07-01 DIAGNOSIS — K760 Fatty (change of) liver, not elsewhere classified: Secondary | ICD-10-CM

## 2022-07-01 NOTE — Telephone Encounter (Signed)
Please let this patient know the results of her RUQ [place]. Please check to see if she has seen GI in the past for fatty liver and if not which direction would she consider being seen. Closest would be Dr.Conley at [facility].

## 2022-07-02 NOTE — Telephone Encounter (Signed)
Pt. Notified and verbalized understanding. Pt. Has not seen a GI and states that Dr. Katherine Roan would be fine. Corie Chiquito, RN

## 2022-07-09 ENCOUNTER — Other Ambulatory Visit (INDEPENDENT_AMBULATORY_CARE_PROVIDER_SITE_OTHER): Payer: Self-pay | Admitting: FAMILY MEDICINE

## 2022-07-09 MED ORDER — GABAPENTIN 600 MG TABLET
600.0000 mg | ORAL_TABLET | Freq: Three times a day (TID) | ORAL | 0 refills | Status: DC
Start: 2022-07-09 — End: 2022-08-07

## 2022-07-31 ENCOUNTER — Other Ambulatory Visit (INDEPENDENT_AMBULATORY_CARE_PROVIDER_SITE_OTHER): Payer: Self-pay | Admitting: FAMILY MEDICINE

## 2022-07-31 MED ORDER — LISINOPRIL 30 MG TABLET
30.0000 mg | ORAL_TABLET | Freq: Every day | ORAL | 1 refills | Status: DC
Start: 2022-07-31 — End: 2022-11-02

## 2022-08-07 ENCOUNTER — Other Ambulatory Visit (INDEPENDENT_AMBULATORY_CARE_PROVIDER_SITE_OTHER): Payer: Self-pay | Admitting: FAMILY MEDICINE

## 2022-08-07 MED ORDER — GABAPENTIN 600 MG TABLET
600.0000 mg | ORAL_TABLET | Freq: Three times a day (TID) | ORAL | 0 refills | Status: DC
Start: 2022-08-07 — End: 2022-09-07

## 2022-08-10 ENCOUNTER — Other Ambulatory Visit (INDEPENDENT_AMBULATORY_CARE_PROVIDER_SITE_OTHER): Payer: Self-pay | Admitting: FAMILY MEDICINE

## 2022-08-10 MED ORDER — ALLOPURINOL 100 MG TABLET
100.0000 mg | ORAL_TABLET | Freq: Two times a day (BID) | ORAL | 1 refills | Status: DC
Start: 2022-08-10 — End: 2023-02-04

## 2022-09-07 ENCOUNTER — Other Ambulatory Visit (INDEPENDENT_AMBULATORY_CARE_PROVIDER_SITE_OTHER): Payer: Self-pay | Admitting: FAMILY MEDICINE

## 2022-09-07 MED ORDER — GABAPENTIN 600 MG TABLET
600.0000 mg | ORAL_TABLET | Freq: Three times a day (TID) | ORAL | 0 refills | Status: DC
Start: 2022-09-07 — End: 2022-10-08

## 2022-09-17 ENCOUNTER — Other Ambulatory Visit (INDEPENDENT_AMBULATORY_CARE_PROVIDER_SITE_OTHER): Payer: Self-pay | Admitting: FAMILY MEDICINE

## 2022-09-17 ENCOUNTER — Encounter (INDEPENDENT_AMBULATORY_CARE_PROVIDER_SITE_OTHER): Payer: Self-pay | Admitting: FAMILY MEDICINE

## 2022-09-17 NOTE — Telephone Encounter (Signed)
Please check to see if the patient requested the Triamcinolone cream and what she has been using it for. I see where it was last filled in 2020.

## 2022-10-08 ENCOUNTER — Other Ambulatory Visit (INDEPENDENT_AMBULATORY_CARE_PROVIDER_SITE_OTHER): Payer: Self-pay | Admitting: FAMILY MEDICINE

## 2022-10-14 NOTE — Progress Notes (Unsigned)
Family Medicine, [facility]  [address]  [place] [address]  [phone]      Patient Name:  Gwendolyn Crawford  MRN:  [id number]  DOB:  Aug 06, 1955    Date of Service:  10/15/2022    Chief Complaint: No chief complaint on file.      Subjective:  Gwendolyn Crawford is a 67 y.o. female who returns ***    Past Medical History:  Past Medical History:   Diagnosis Date    Allergic rhinitis     Anxiety     Arthritis     Depression     Diabetes mellitus, type 2 (CMS HCC)     Encounter for initial annual wellness visit (AWV) in Medicare patient 10/05/2020    Gout     Headache     Hypercholesterolemia     Hypertension     Hypothyroidism     Insomnia     PONV (postoperative nausea and vomiting)     RLS (restless legs syndrome)     Vitamin deficiency     Wears glasses             Past Surgical History:  Past Surgical History:   Procedure Laterality Date    COLONOSCOPY      ESOPHAGOGASTRODUODENOSCOPY      HX BREAST BIOPSY Left     HX BENIGN BREAST BX UNSURE WHICH SIDE    HX CHOLECYSTECTOMY      HX DENTAL EXTRACTION              Review of Systems:  Review of Systems   Constitutional: Negative for fever.   HENT: Negative for sore throat.    Eyes: Negative for blurred vision.   Respiratory: Negative for cough and shortness of breath.    Cardiovascular: Negative for chest pain.   Gastrointestinal: Negative for abdominal pain, nausea and vomiting.   Genitourinary: Negative for dysuria.   Musculoskeletal: Negative for falls.   Skin: Negative for rash.   Neurological: Negative for headaches.         Current Outpatient Medications   Medication Sig    acetaminophen (TYLENOL) 325 mg Oral Tablet Take 1 Tablet (325 mg total) by mouth Every 4 hours as needed for Pain    allopurinoL (ZYLOPRIM) 100 mg Oral Tablet Take 1 Tablet (100 mg total) by mouth Twice daily    ascorbic acid (VITAMIN C) 1,000 mg Oral Tablet Take 1 Tablet (1,000 mg total) by mouth Once a day    aspirin (ECOTRIN) 81 mg Oral Tablet, Delayed Release  (E.C.) Take 1 Tablet (81 mg total) by mouth Once a day Pt reports Taking once weekly    cholecalciferol, vitamin D3, 25 mcg (1,000 unit) Oral Tablet Take 1 Tablet (1,000 Units total) by mouth Once a day    cinnamon bark (CINNAMON ORAL) Take 1,000 mg by mouth bid    CINNAMON BARK EXTRACT ORAL Take 1,000 mg by mouth Twice daily    cyanocobalamin (VITAMIN B 12) 1,000 mcg Oral Tablet Take 1 Tablet (1,000 mcg total) by mouth Once a day    DULoxetine (CYMBALTA DR) 30 mg Oral Capsule, Delayed Release(E.C.) Take 1 Capsule (30 mg total) by mouth Once a day for 180 days Indications: anxiousness associated with depression, diabetic complication causing injury to some body nerves    gabapentin (NEURONTIN) 600 mg Oral Tablet TAKE ONE TABLET BY MOUTH THREE TIMES A DAY    glimepiride (AMARYL) 1 mg Oral Tablet Take 0.5 Tablets (0.5 mg  total) by mouth Every morning with breakfast Indications: type 2 diabetes mellitus    levothyroxine (SYNTHROID) 100 mcg Oral Tablet TAKE 1 TABLET BY MOUTH ONCE DAILY ON AN EMPTY STOMACH FOR THYROID    Lisinopril (PRINIVIL) 30 mg Oral Tablet Take 1 Tablet (30 mg total) by mouth Once a day for 180 days for blood pressure Indications: high blood pressure, 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    loratadine (CLARITIN) 10 mg Oral Tablet Take 1 Tablet (10 mg total) by mouth Once a day    meclizine (ANTIVERT) 25 mg Oral Tablet Take 1 Tablet (25 mg total) by mouth Every 8 hours as needed    metFORMIN (GLUCOPHAGE) 500 mg Oral Tablet TAKE TWO TABLETS BY MOUTH TWO TIMES A DAY WITH FOOD FOR BLOOD SUGAR Indications: type 2 diabetes mellitus    naproxen Sodium (ALEVE) 220 mg Oral Tablet Take 1 Tablet (220 mg total) by mouth Every 12 hours as needed    pravastatin (PRAVACHOL) 10 mg Oral Tablet Take 1 Tablet (10 mg total) by mouth Every evening Indications: excessive fat in the blood    turmeric (CURCUMIN N/A) 1 Capsule Once a day 500mg        Allergies   Allergen Reactions    Zocor [Simvastatin] Myalgia    Crestor  [Rosuvastatin]  Other Adverse Reaction (Add comment) and Myalgia     unknown    Lipitor [Atorvastatin] Myalgia       Objective:    There were no vitals taken for this visit.      There is no height or weight on file to calculate BMI.     Physical Exam:  Constitutional: she is oriented to person, place, and time and well-developed, well-nourished, and in no distress.   HENT:   Head: Normocephalic.   Eyes: Pupils are equal, round, and reactive to light. Conjunctivae and EOM are normal. Right eye exhibits no discharge. Left eye exhibits no discharge.   Neck: Normal range of motion. Neck supple. No thyromegaly present.   Cardiovascular: Normal rate, regular rhythm, normal heart sounds and intact distal pulses. Exam reveals no friction rub.   No murmur heard.  Pulmonary/Chest: Breath sounds normal. No respiratory distress. she  has no wheezes. she  has no rales.   Musculoskeletal:         General: No tenderness or edema.   Lymphadenopathy:     she  has no cervical adenopathy.   Neurological she  is alert and oriented to person, place, and time.   Skin: Skin is warm and dry.   Psychiatric: Affect normal.      Data reviewed:  {Significant XBM:[id number]}     {Significant Radiology:[id number]}     Social Determinants identified with potential adverse effects on health outcomes:  {SOCIAL DETERMINANTS OF HEALTH IDENTIFIED ADDRESSED:34188}           Assessment/Plan:  No diagnosis found.       Plan:    ***  Treatment plan was discussed with patient and shared decision making employed. The patient was encouraged to call with any additional questions or concerns.     Discussed with patient effects and side effects of medications. Medication safety was discussed. A good faith effort was made to reconcile the patient's medications.   No orders of the defined types were placed in this encounter.        Follow up:  No follow-ups on file.    This note was partially generated using MModal Fluency Direct system, and there may be some  incorrect words, spellings, and punctuation that were not noted in checking the note before saving.    Leonie Man, MD  10/14/2022, 11:01

## 2022-10-15 ENCOUNTER — Other Ambulatory Visit: Payer: Self-pay

## 2022-10-15 ENCOUNTER — Encounter (INDEPENDENT_AMBULATORY_CARE_PROVIDER_SITE_OTHER): Payer: Self-pay | Admitting: FAMILY MEDICINE

## 2022-10-15 ENCOUNTER — Ambulatory Visit: Payer: Medicare Other | Attending: FAMILY MEDICINE | Admitting: FAMILY MEDICINE

## 2022-10-15 VITALS — BP 130/70 | HR 81 | Temp 98.0°F | Resp 16 | Ht 64.0 in | Wt 204.0 lb

## 2022-10-15 DIAGNOSIS — E1142 Type 2 diabetes mellitus with diabetic polyneuropathy: Secondary | ICD-10-CM | POA: Insufficient documentation

## 2022-10-15 DIAGNOSIS — F419 Anxiety disorder, unspecified: Secondary | ICD-10-CM | POA: Insufficient documentation

## 2022-10-15 DIAGNOSIS — Z7985 Long-term (current) use of injectable non-insulin antidiabetic drugs: Secondary | ICD-10-CM | POA: Insufficient documentation

## 2022-10-15 DIAGNOSIS — E782 Mixed hyperlipidemia: Secondary | ICD-10-CM | POA: Insufficient documentation

## 2022-10-15 DIAGNOSIS — I10 Essential (primary) hypertension: Secondary | ICD-10-CM | POA: Insufficient documentation

## 2022-10-15 DIAGNOSIS — F3341 Major depressive disorder, recurrent, in partial remission: Secondary | ICD-10-CM | POA: Insufficient documentation

## 2022-10-15 DIAGNOSIS — E669 Obesity, unspecified: Secondary | ICD-10-CM | POA: Insufficient documentation

## 2022-10-15 DIAGNOSIS — Z7984 Long term (current) use of oral hypoglycemic drugs: Secondary | ICD-10-CM | POA: Insufficient documentation

## 2022-10-15 DIAGNOSIS — Z23 Encounter for immunization: Secondary | ICD-10-CM | POA: Insufficient documentation

## 2022-10-15 DIAGNOSIS — K760 Fatty (change of) liver, not elsewhere classified: Secondary | ICD-10-CM | POA: Insufficient documentation

## 2022-10-15 DIAGNOSIS — Z6835 Body mass index (BMI) 35.0-35.9, adult: Secondary | ICD-10-CM | POA: Insufficient documentation

## 2022-10-15 DIAGNOSIS — N951 Menopausal and female climacteric states: Secondary | ICD-10-CM | POA: Insufficient documentation

## 2022-10-15 DIAGNOSIS — E039 Hypothyroidism, unspecified: Secondary | ICD-10-CM | POA: Insufficient documentation

## 2022-10-15 LAB — BASIC METABOLIC PANEL, FASTING
ANION GAP: 9 mmol/L (ref 4–13)
BUN/CREA RATIO: 13 (ref 6–22)
BUN: 11 mg/dL (ref 8–25)
CALCIUM: 10.7 mg/dL — ABNORMAL HIGH (ref 8.6–10.3)
CHLORIDE: 104 mmol/L (ref 96–111)
CO2 TOTAL: 28 mmol/L (ref 23–31)
CREATININE: 0.82 mg/dL (ref 0.60–1.05)
ESTIMATED GFR - FEMALE: 78 mL/min/BSA (ref >=60)
GLUCOSE: 109 mg/dL — ABNORMAL HIGH (ref 70–99)
POTASSIUM: 4.2 mmol/L (ref 3.5–5.1)
SODIUM: 141 mmol/L (ref 136–145)

## 2022-10-15 LAB — CBC WITH DIFF
BASOPHIL #: <0.1 10*3/uL (ref <=0.20)
BASOPHIL %: 0.4 %
EOSINOPHIL #: 0.11 10*3/uL (ref <=0.50)
EOSINOPHIL %: 1.6 %
HCT: 45 % (ref 34.8–46.0)
HGB: 14.9 g/dL (ref 11.5–16.0)
IMMATURE GRANULOCYTE #: <0.1 10*3/uL (ref <0.10)
IMMATURE GRANULOCYTE %: 0.3 % (ref 0.0–1.0)
LYMPHOCYTE #: 3.16 10*3/uL (ref 1.00–4.80)
LYMPHOCYTE %: 44.6 %
MCH: 30.5 pg (ref 26.0–32.0)
MCHC: 33.1 g/dL (ref 31.0–35.5)
MCV: 92.2 fL (ref 78.0–100.0)
MONOCYTE #: 0.41 10*3/uL (ref 0.20–1.10)
MONOCYTE %: 5.8 %
MPV: 12 fL (ref 8.7–12.5)
NEUTROPHIL #: 3.35 10*3/uL (ref 1.50–7.70)
NEUTROPHIL %: 47.3 %
PLATELETS: 226 10*3/uL (ref 150–400)
RBC: 4.88 10*6/uL (ref 3.85–5.22)
RDW-CV: 13.2 % (ref 11.5–15.5)
WBC: 7.1 10*3/uL (ref 3.7–11.0)

## 2022-10-15 LAB — HGA1C (HEMOGLOBIN A1C WITH EST AVG GLUCOSE)
ESTIMATED AVERAGE GLUCOSE: 177 mg/dL
HEMOGLOBIN A1C: 7.8 % — ABNORMAL HIGH (ref 4.0–6.0)

## 2022-10-15 LAB — LDH: LDH: 184 U/L (ref 125–220)

## 2022-10-15 NOTE — Nursing Note (Signed)
PT HERE FOR 4 MONTH CHECK UP, NO REFILLS AT THIS TIME

## 2022-10-15 NOTE — Patient Instructions (Signed)
Vaccine Information Statement    Influenza (Flu) Vaccine (Inactivated or Recombinant): What You Need to Know    Many vaccine information statements are available in Spanish and other languages. See www.immunize.org/vis.  Hojas de informacin sobre vacunas estn disponibles en espaol y en muchos otros idiomas. Visite www.immunize.org/vis.    1. Why get vaccinated?    Influenza vaccine can prevent influenza (flu).    Flu is a contagious disease that spreads around the [place] every year, usually between October and May. Anyone can get the flu, but it is more dangerous for some people. Infants and young children, people 65 years and older, pregnant people, and people with certain health conditions or a weakened immune system are at greatest risk of flu complications.    Pneumonia, bronchitis, sinus infections, and ear infections are examples of flu-related complications. If you have a medical condition, such as heart disease, cancer, or diabetes, flu can make it worse.    Flu can cause fever and chills, sore throat, muscle aches, fatigue, cough, headache, and runny or stuffy nose. Some people may have vomiting and diarrhea, though this is more common in children than adults.     In an average year, thousands of people in the [place] die from flu, and many more are hospitalized. Flu vaccine prevents millions of illnesses and flu-related visits to the doctor each year.    2. Influenza vaccines     CDC recommends everyone 6 months and older get vaccinated every flu season. Children 6 months through 8 years of age may need 2 doses during a single flu season. Everyone else needs only 1 dose each flu season.    It takes about 2 weeks for protection to develop after vaccination.    There are many flu viruses, and they are always changing. Each year a new flu vaccine is made to protect against the influenza viruses believed to be likely to cause disease in the upcoming flu season. Even when the vaccine doesn't  exactly match these viruses, it may still provide some protection.     Influenza vaccine does not cause flu.    Influenza vaccine may be given at the same time as other vaccines.    3. Talk with your health care provider    Tell your vaccination provider if the person getting the vaccine:  . Has had an allergic reaction after a previous dose of influenza vaccine, or has any severe, life-threatening allergies   . Has ever had Guillain-Barr Syndrome (also called "GBS")    In some cases, your health care provider may decide to postpone influenza vaccination until a future visit.    Influenza vaccine can be administered at any time during pregnancy. People who are or will be pregnant during influenza season should receive inactivated influenza vaccine.    People with minor illnesses, such as a cold, may be vaccinated. People who are moderately or severely ill should usually wait until they recover before getting influenza vaccine.    Your health care provider can give you more information.    4. Risks of a vaccine reaction    . Soreness, redness, and swelling where the shot is given, fever, muscle aches, and headache can happen after influenza vaccination.  . There may be a very small increased risk of Guillain-Barr Syndrome (GBS) after inactivated influenza vaccine (the flu shot).    Young children who get the flu shot along with pneumococcal vaccine (PCV13) and/or DTaP vaccine at the same time might be   slightly more likely to have a seizure caused by fever. Tell your health care provider if a child who is getting flu vaccine has ever had a seizure.    People sometimes faint after medical procedures, including vaccination. Tell your provider if you feel dizzy or have vision changes or ringing in the ears.    As with any medicine, there is a very remote chance of a vaccine causing a severe allergic reaction, other serious injury, or death.    5. What if there is a serious problem?    An allergic reaction could occur  after the vaccinated person leaves the clinic. If you see signs of a severe allergic reaction (hives, swelling of the face and throat, difficulty breathing, a fast heartbeat, dizziness, or weakness), call 9-1-1 and get the person to the nearest hospital.    For other signs that concern you, call your health care provider.    Adverse reactions should be reported to the Vaccine Adverse Event Reporting System (VAERS). Your health care provider will usually file this report, or you can do it yourself. Visit the VAERS website at www.vaers.hhs.gov or call 1-800-822-7967. VAERS is only for reporting reactions, and VAERS staff members do not give medical advice.    6. The National Vaccine Injury Compensation Program    The National Vaccine Injury Compensation Program (VICP) is a federal program that was created to compensate people who may have been injured by certain vaccines. Claims regarding alleged injury or death due to vaccination have a time limit for filing, which may be as short as two years. Visit the VICP website at www.hrsa.gov/vaccinecompensation or call 1-800-338-2382 to learn about the program and about filing a claim.     7. How can I learn more?    . Ask your health care provider.   . Call your local or state health department.   . Visit the website of the Food and Drug Administration (FDA) for vaccine package inserts and additional information at www.fda.gov/vaccines-blood-biologics/vaccines.  . Contact the Centers for Disease Control and Prevention (CDC):  - Call 1-800-232-4636 (1-800-CDC-INFO) or  - Visit CDC's influenza website at www.cdc.gov/flu.    Vaccine Information Statement   Inactivated Influenza Vaccine   08/28/2019  42 U.S[place]C.  [id number]   Department of Health and Human Services  Centers for Disease Control and Prevention    Office Use Only

## 2022-10-16 LAB — LDL CHOLESTEROL, DIRECT: LDL DIRECT: 139 mg/dL — ABNORMAL HIGH (ref <100)

## 2022-10-18 ENCOUNTER — Encounter (INDEPENDENT_AMBULATORY_CARE_PROVIDER_SITE_OTHER): Payer: Self-pay

## 2022-10-18 ENCOUNTER — Telehealth (INDEPENDENT_AMBULATORY_CARE_PROVIDER_SITE_OTHER): Payer: Self-pay | Admitting: FAMILY MEDICINE

## 2022-10-18 MED ORDER — ATORVASTATIN 20 MG TABLET
20.0000 mg | ORAL_TABLET | Freq: Every day | ORAL | 1 refills | Status: DC
Start: 2022-10-18 — End: 2023-01-17

## 2022-10-18 NOTE — Telephone Encounter (Signed)
Patient made aware and states understanding and agreement. Encouraged to communicate with staff any questions or concerns regarding plan of care.

## 2022-10-18 NOTE — Telephone Encounter (Signed)
Please let this patient know the results of her labs. Her calcium was elevated at 10.7. If she can stop by and have this repeated with other labs. Her LDL was 139. Check to see if she is tolerating the Pravastatin 10mg  po daily and will consider increasing the dose to get to goal of LDL less than 100. Her A1c was 7.8. Encourage the patient with diet, exercise and weight loss. Her cbc is stable.

## 2022-10-18 NOTE — Telephone Encounter (Signed)
Please let the patient know that I sent in Lipitor 20mg  po daily to [facility]. Remind the patient if she has any symptoms with it to stop it and let me know.

## 2022-10-18 NOTE — Telephone Encounter (Signed)
Pt is currently taking Lipitor 10 mg 1 daily.   Pt states was told to stop pravastatin.    Has not missed any Lipitor, states willing to increase the Lipitor if need be..    Pt wonders if we have any diets on keto.    Tereso Newcomer, [place]

## 2022-10-18 NOTE — Telephone Encounter (Signed)
Left message and instructed pt. To call me back.Corie Chiquito, RN

## 2022-10-22 ENCOUNTER — Telehealth (INDEPENDENT_AMBULATORY_CARE_PROVIDER_SITE_OTHER): Payer: Self-pay | Admitting: FAMILY MEDICINE

## 2022-10-22 LAB — LIVER FIBROSIS, FIBROTEST PNL
ALPHA-2-MACROGLOBULIN: 366 mg/dL — ABNORMAL HIGH (ref 106–279)
ALT: 23 U/L (ref 6–29)
APOLIPOPROTEIN A1: 186 mg/dL (ref 101–198)
FIBROSIS SCORE: 0.52
GGT: 27 U/L (ref 3–65)
HAPTOGLOBIN: 135 mg/dL (ref 43–212)
NECROINFLAMMAT ACT SCORE: 0.13
REFERENCE ID: 5127471
TOTAL BILIRUBIN: 1 mg/dL (ref 0.2–1.2)

## 2022-10-22 NOTE — Telephone Encounter (Signed)
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 Continue with diet, exercise and weight loss. Keep her f/u apt with GI that had ordered the test.

## 2022-10-22 NOTE — Telephone Encounter (Signed)
Pt. Notified and verbalized understanding. Jazmin Ley, RN

## 2022-11-02 ENCOUNTER — Other Ambulatory Visit (HOSPITAL_COMMUNITY): Payer: Self-pay

## 2022-11-02 ENCOUNTER — Other Ambulatory Visit (INDEPENDENT_AMBULATORY_CARE_PROVIDER_SITE_OTHER): Payer: Self-pay | Admitting: FAMILY MEDICINE

## 2022-11-02 DIAGNOSIS — K760 Fatty (change of) liver, not elsewhere classified: Secondary | ICD-10-CM

## 2022-11-05 ENCOUNTER — Other Ambulatory Visit (INDEPENDENT_AMBULATORY_CARE_PROVIDER_SITE_OTHER): Payer: Self-pay | Admitting: FAMILY MEDICINE

## 2022-11-06 ENCOUNTER — Telehealth (INDEPENDENT_AMBULATORY_CARE_PROVIDER_SITE_OTHER): Payer: Self-pay | Admitting: FAMILY MEDICINE

## 2022-11-06 MED ORDER — SEMAGLUTIDE 0.25 MG OR 0.5 MG (2 MG/3 ML) SUBCUTANEOUS PEN INJECTOR
0.5000 mg | PEN_INJECTOR | SUBCUTANEOUS | 0 refills | Status: DC
Start: 2022-11-06 — End: 2023-02-06

## 2022-11-06 NOTE — Telephone Encounter (Signed)
Please let the patient know that the increased dose of Ozempic 0.5mg  was sent to [facility]. Let her know if she has any side effects to the increased dose decrease back to the lower dose and let me know.

## 2022-11-06 NOTE — Telephone Encounter (Signed)
Patient states Dr. Katherine Roan started her on Ozempic. Stateshas been on the Ozempic 0.25mg  times 6 weeks and it's not helping. States it's not "lowering her sugar any". Requesting Ozempic be increased to 0.5mg      Pharm[facility]: [facility]

## 2022-11-07 NOTE — Telephone Encounter (Signed)
Pt verbalized understanding.

## 2022-11-26 ENCOUNTER — Other Ambulatory Visit: Payer: Self-pay

## 2022-11-26 ENCOUNTER — Ambulatory Visit: Payer: Medicare Other | Attending: FAMILY MEDICINE | Admitting: FAMILY MEDICINE

## 2022-11-26 ENCOUNTER — Encounter (INDEPENDENT_AMBULATORY_CARE_PROVIDER_SITE_OTHER): Payer: Self-pay | Admitting: FAMILY MEDICINE

## 2022-11-26 DIAGNOSIS — I10 Essential (primary) hypertension: Secondary | ICD-10-CM | POA: Insufficient documentation

## 2022-11-26 DIAGNOSIS — E669 Obesity, unspecified: Secondary | ICD-10-CM | POA: Insufficient documentation

## 2022-11-26 DIAGNOSIS — Z6835 Body mass index (BMI) 35.0-35.9, adult: Secondary | ICD-10-CM | POA: Insufficient documentation

## 2022-11-26 DIAGNOSIS — E1142 Type 2 diabetes mellitus with diabetic polyneuropathy: Secondary | ICD-10-CM | POA: Insufficient documentation

## 2022-11-26 DIAGNOSIS — R61 Generalized hyperhidrosis: Secondary | ICD-10-CM | POA: Insufficient documentation

## 2022-11-26 DIAGNOSIS — E782 Mixed hyperlipidemia: Secondary | ICD-10-CM | POA: Insufficient documentation

## 2022-11-26 DIAGNOSIS — S93409A Sprain of unspecified ligament of unspecified ankle, initial encounter: Secondary | ICD-10-CM | POA: Insufficient documentation

## 2022-11-26 DIAGNOSIS — E039 Hypothyroidism, unspecified: Secondary | ICD-10-CM | POA: Insufficient documentation

## 2022-11-26 DIAGNOSIS — G2581 Restless legs syndrome: Secondary | ICD-10-CM | POA: Insufficient documentation

## 2022-11-26 LAB — PARATHYROID HORMONE (PTH): PTH: 63.2 pg/mL (ref 8.5–77.0)

## 2022-11-26 LAB — VITAMIN D 25 TOTAL: VITAMIN D 25, TOTAL: 47 ng/mL (ref 20.0–100.0)

## 2022-11-26 LAB — CALCIUM: CALCIUM: 10 mg/dL (ref 8.6–10.3)

## 2022-11-26 MED ORDER — ASCORBIC ACID (VITAMIN C) 500 MG TABLET
500.0000 mg | ORAL_TABLET | Freq: Every day | ORAL | Status: AC
Start: 2022-11-26 — End: 2023-02-24

## 2022-11-26 NOTE — Nursing Note (Signed)
Pt here for 6 week follow up. Requesting refills in Cymbalta, gabapentin, and amaryl

## 2022-11-26 NOTE — Result Encounter Note (Signed)
I have reviewed these results and no active changes need to be done at this time. Please keep your follow up appointment and continue your current medications. If any problems go to the ER.

## 2022-11-27 ENCOUNTER — Encounter (INDEPENDENT_AMBULATORY_CARE_PROVIDER_SITE_OTHER): Payer: Self-pay | Admitting: FAMILY MEDICINE

## 2022-11-27 LAB — KAPPA AND LAMBDA FREE LIGHT CHAINS, SERUM
KAPPA FREE LIGHT CHAINS: 4.45 mg/dL — ABNORMAL HIGH (ref 1.25–3.25)
KAPPA/LAMBDA FLC RATIO: 1.95 (ref 0.80–2.10)
LAMBDA FREE LIGHT CHAINS: 2.28 mg/dL (ref 0.60–2.70)

## 2022-11-27 LAB — ALBUMIN FOR ELECTROPHORESIS: ALBUMIN: 4.3 g/dL (ref 3.4–4.8)

## 2022-11-27 LAB — PROTEIN FOR ELECTROPHORESIS: PROTEIN TOTAL: 6.9 g/dL (ref 5.6–7.6)

## 2022-11-27 LAB — PROTEIN, RANDOM URINE FOR ELECTROPHORESIS: PROTEIN RANDOM URINE: <7 mg/dL (ref <30)

## 2022-11-27 LAB — MICROALBUMIN, RANDOM URINE FOR ELECTROPHORESIS: MICROALBUMIN RANDOM URINE: <0.5 mg/dL (ref <2.0)

## 2022-11-27 NOTE — Progress Notes (Signed)
Family Medicine, [facility]  [address]  [place] [address]  [phone]      Patient Name:  Gwendolyn Crawford  MRN:  [id number]  DOB:  February 02, 1955    Date of Service:  11/26/2022    Chief Complaint:   Chief Complaint   Patient presents with    Follow Up     6 week          Subjective:  Gwendolyn Crawford is a 67 y.o. female who returns for a f/u apt. She states that her sugars are still elevated. Her BP is doing well. She still has sweats and did not get her Quantiferon drawn. No sure why since it was ordered and is no longer on the list of ordered labs.     Past Medical History:  Past Medical History:   Diagnosis Date    Allergic rhinitis     Anxiety     Arthritis     Depression     Diabetes mellitus, type 2 (CMS HCC)     Encounter for initial annual wellness visit (AWV) in Medicare patient 10/05/2020    Gout     Headache     Hypercholesterolemia     Hypertension     Hypothyroidism     Insomnia     PONV (postoperative nausea and vomiting)     RLS (restless legs syndrome)     Vitamin deficiency     Wears glasses             Past Surgical History:  Past Surgical History:   Procedure Laterality Date    COLONOSCOPY      ESOPHAGOGASTRODUODENOSCOPY      HX BREAST BIOPSY Left     HX BENIGN BREAST BX UNSURE WHICH SIDE    HX CHOLECYSTECTOMY      HX DENTAL EXTRACTION              Review of Systems:  Review of Systems   Constitutional: Negative for fever.   HENT: Negative for sore throat.    Eyes: Negative for blurred vision.   Respiratory: Negative for cough and shortness of breath.    Cardiovascular: Negative for chest pain.   Gastrointestinal: Negative for abdominal pain, nausea and vomiting.   Genitourinary: Negative for dysuria.   Musculoskeletal: Negative for falls.   Skin: Negative for rash.   Neurological: Negative for headaches.         Current Outpatient Medications   Medication Sig    acetaminophen (TYLENOL) 325 mg Oral Tablet Take 1 Tablet (325 mg total) by mouth Every 4 hours as needed  for Pain    allopurinoL (ZYLOPRIM) 100 mg Oral Tablet Take 1 Tablet (100 mg total) by mouth Twice daily    ascorbic acid (VITAMIN C) 500 mg Oral Tablet Take 1 Tablet (500 mg total) by mouth Once a day for 90 days    aspirin (ECOTRIN) 81 mg Oral Tablet, Delayed Release (E.C.) Take 1 Tablet (81 mg total) by mouth Once a day Pt reports Taking once weekly    atorvastatin (LIPITOR) 20 mg Oral Tablet Take 1 Tablet (20 mg total) by mouth Once a day Indications: excessive fat in the blood    cholecalciferol, vitamin D3, 25 mcg (1,000 unit) Oral Tablet Take 1 Tablet (1,000 Units total) by mouth Once a day    cinnamon bark (CINNAMON ORAL) Take 1,000 mg by mouth bid    CINNAMON BARK EXTRACT ORAL Take 1,000 mg by mouth Twice daily  cyanocobalamin (VITAMIN B 12) 1,000 mcg Oral Tablet Take 1 Tablet (1,000 mcg total) by mouth Once a day    DULoxetine (CYMBALTA DR) 30 mg Oral Capsule, Delayed Release(E.C.) Take 1 Capsule (30 mg total) by mouth Once a day for 180 days Indications: anxiousness associated with depression, diabetic complication causing injury to some body nerves    gabapentin (NEURONTIN) 600 mg Oral Tablet TAKE ONE TABLET BY MOUTH THREE TIMES A DAY    glimepiride (AMARYL) 1 mg Oral Tablet Take 0.5 Tablets (0.5 mg total) by mouth Every morning with breakfast Indications: type 2 diabetes mellitus    levothyroxine (SYNTHROID) 100 mcg Oral Tablet TAKE 1 TABLET BY MOUTH ONCE DAILY ON AN EMPTY STOMACH FOR THYROID    Lisinopril (PRINIVIL) 30 mg Oral Tablet TAKE ONE TABLET BY MOUTH EVERY DAY FOR HIGH BLOOD PRESSURE    loratadine (CLARITIN) 10 mg Oral Tablet Take 1 Tablet (10 mg total) by mouth Once a day    meclizine (ANTIVERT) 25 mg Oral Tablet Take 1 Tablet (25 mg total) by mouth Every 8 hours as needed    metFORMIN (GLUCOPHAGE) 500 mg Oral Tablet TAKE TWO TABLETS BY MOUTH TWO TIMES A DAY WITH FOOD FOR BLOOD SUGAR Indications: type 2 diabetes mellitus    naproxen Sodium (ALEVE) 220 mg Oral Tablet Take 1 Tablet (220 mg  total) by mouth Every 12 hours as needed    semaglutide (OZEMPIC) 0.25 mg or 0.5 mg (2 mg/3 mL) Subcutaneous Pen Injector Inject 0.5 mg under the skin Every 7 days for 90 days Indications: type 2 diabetes mellitus       Allergies   Allergen Reactions    Zocor [Simvastatin] Myalgia    Crestor [Rosuvastatin]  Other Adverse Reaction (Add comment) and Myalgia     unknown       Objective:    BP 130/86   Pulse 80   Temp 36.7 C (98.1 F) (Thermal Scan)   Resp 18   Ht 1.626 m (5\' 4" )   Wt 93.3 kg (205 lb 9.6 oz)   SpO2 93%   BMI 35.29 kg/m       Body mass index is 35.29 kg/m.     Physical Exam:  Constitutional: she is oriented to person, place, and time and obese and in no distress.   HENT: tm and canals with some cerumen bilaterally. No erythema or exudate.    Head: Normocephalic.   Eyes: Pupils are equal, round, and reactive to light. Conjunctivae and EOM are normal. Right eye exhibits no discharge. Left eye exhibits no discharge.   Neck: Normal range of motion. Neck supple. No thyromegaly present.   Cardiovascular: Normal rate, regular rhythm, normal heart sounds and intact distal pulses. Exam reveals no friction rub.   No murmur heard.  Pulmonary/Chest: Breath sounds normal. No respiratory distress. she  has no wheezes. she  has no rales.   Musculoskeletal:         General: No tenderness or edema.   Lymphadenopathy:     she  has no cervical adenopathy.   Neurological she  is alert and oriented to person, place, and time.   Skin: Skin is warm and dry.   Psychiatric: Affect normal.      Data reviewed:  A1C: 7.8  A1C Date: 10/15/2022  BASIC METABOLIC PANEL  Lab Results   Component Value Date    SODIUM 141 10/15/2022    POTASSIUM 4.2 10/15/2022    CHLORIDE 104 10/15/2022    CO2 28 10/15/2022  ANIONGAP 9 10/15/2022    BUN 11 10/15/2022    CREATININE 0.82 10/15/2022    BUNCRRATIO 13 10/15/2022    GFR 78 10/15/2022    CALCIUM 10.0 11/26/2022    GLUCOSE 109 (H) 10/15/2022    GLUCOSENF 164 (H) 02/10/2019          Recent Results (from the past 04540 hour(s))   MAMMO BILATERAL SCREENING-ADDL VIEWS/BREAST US AS REQ BY RAD    Collection Time: 01/08/22 10:52 AM    Narrative    Computer aided detection (CAD) technology has been applied to the standard   (CC and MLO) images.    3D tomosynthesis and 2D images were acquired and reviewed.    FILMS COMPARED:  Compared to: 01/05/2021 MAMMO BILATERAL SCREENING-ADDL VIEWS/BREAST US AS   REQ BY RAD, 01/04/2020 MAMMO BILATERAL SCREENING-ADDL VIEWS/BREAST US AS   REQ BY RAD, 10/27/2018 MAMMO BILATERAL SCREENING-ADDL VIEWS/BREAST US AS   REQ BY RAD, and 10/24/2017 MAMMO BILAT SCREENING WO TOMO-ADDL VIEWS/BREAST   US AS REQ BY RAD    HISTORY:  Procedure: MAMMO BILATERAL SCREENING W TOMO-ADDL VIEWS/BREAST US AS REQ BY   RAD  Reason for exam: Breast cancer screening by mammogram       FINDINGS:  The breasts are almost entirely fatty.    Bilateral  There is no evidence of suspicious masses, calcifications, or other   abnormal findings.      Impression    Follow up mammogram in 1 year is recommended for both breasts.    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 She is to f/u with GI. She is to f/u in 3 months and prn. Reviewed with the patient that Cymbalta can cause diaphoresis. She had labs drawn today.    Treatment plan was discussed with patient and shared decision making employed. The patient was encouraged to call with any additional questions or concerns.      Discussed with patient effects and side effects of medications. Medication safety was discussed. A good faith effort was made to reconcile the patient's medications.   Orders Placed This Encounter    XR ANKLE RIGHT 2 VIEW    KAPPA AND LAMBDA FREE LIGHT CHAINS, SERUM    MONOCLONAL GAMMOPATHY PROFILE WITH IMMUNOTYPING REFLEX    ALBUMIN FOR ELECTROPHORESIS    PROTEIN FOR ELECTROPHORESIS    PROTEIN ELECTROPHORESIS, RANDOM URINE (UPEP)    MICROALBUMIN, RANDOM URINE FOR ELECTROPHORESIS    PROTEIN, RANDOM URINE FOR ELECTROPHORESIS    ascorbic acid (VITAMIN C) 500 mg Oral Tablet         Follow up:  No follow-ups on file.    This note was partially generated using MModal Fluency Direct system, and there may be some incorrect words, spellings, and punctuation that were not noted in checking the note before saving.    Leonie Man,  MD  11/27/2022, 18:14

## 2022-11-28 LAB — PROTEIN ELECTROPHORESIS, RANDOM URINE (UPEP)
MICROALBUMIN RANDOM URINE: <0.5 mg/dL (ref <2.0)
PROTEIN RANDOM URINE: <7 mg/dL (ref <30)

## 2022-11-29 ENCOUNTER — Telehealth (INDEPENDENT_AMBULATORY_CARE_PROVIDER_SITE_OTHER): Payer: Self-pay | Admitting: FAMILY MEDICINE

## 2022-11-29 NOTE — Telephone Encounter (Signed)
Please let this patient know the results of her labs. So far the results are normal. There are few other results pending.

## 2022-11-30 LAB — MONOCLONAL GAMMOPATHY PROFILE WITH IMMUNOTYPING REFLEX
ALBUMIN: 4.3 g/dL (ref 3.4–4.8)
KAPPA FREE LIGHT CHAINS: 4.45 mg/dL — ABNORMAL HIGH (ref 1.25–3.25)
KAPPA/LAMBDA FLC RATIO: 1.95 (ref 0.80–2.10)
LAMBDA FREE LIGHT CHAINS: 2.28 mg/dL (ref 0.60–2.70)
TOTAL PROTEIN: 6.9 g/dL

## 2022-11-30 LAB — IMMUNOSUBTRACTION, SERUM: PATHOLOGIST INTERPRETATION (IMMUNOTYPING): NORMAL

## 2022-11-30 NOTE — Telephone Encounter (Signed)
Verbally understood the labs

## 2022-11-30 NOTE — Result Encounter Note (Signed)
I have reviewed these results and no active changes need to be done at this time. Please keep your follow up appointment and continue your current medications. If any problems go to the ER.

## 2022-12-01 ENCOUNTER — Other Ambulatory Visit (INDEPENDENT_AMBULATORY_CARE_PROVIDER_SITE_OTHER): Payer: Self-pay | Admitting: FAMILY MEDICINE

## 2022-12-03 ENCOUNTER — Other Ambulatory Visit (INDEPENDENT_AMBULATORY_CARE_PROVIDER_SITE_OTHER): Payer: Self-pay | Admitting: FAMILY MEDICINE

## 2023-01-07 ENCOUNTER — Other Ambulatory Visit (INDEPENDENT_AMBULATORY_CARE_PROVIDER_SITE_OTHER): Payer: Self-pay | Admitting: FAMILY MEDICINE

## 2023-01-07 MED ORDER — GABAPENTIN 600 MG TABLET
600.0000 mg | ORAL_TABLET | Freq: Three times a day (TID) | ORAL | 0 refills | Status: DC
Start: 2023-01-07 — End: 2023-02-04

## 2023-01-10 ENCOUNTER — Ambulatory Visit (HOSPITAL_COMMUNITY): Payer: Self-pay

## 2023-01-11 ENCOUNTER — Inpatient Hospital Stay
Admission: RE | Admit: 2023-01-11 | Discharge: 2023-01-11 | Disposition: A | Discharge status: Home / Self Care | Payer: Medicare Other | Source: Ambulatory Visit | Attending: FAMILY MEDICINE

## 2023-01-11 ENCOUNTER — Other Ambulatory Visit: Payer: Self-pay

## 2023-01-11 ENCOUNTER — Telehealth (INDEPENDENT_AMBULATORY_CARE_PROVIDER_SITE_OTHER): Payer: Self-pay | Admitting: FAMILY MEDICINE

## 2023-01-11 DIAGNOSIS — Z1231 Encounter for screening mammogram for malignant neoplasm of breast: Secondary | ICD-10-CM | POA: Insufficient documentation

## 2023-01-11 NOTE — Telephone Encounter (Signed)
Please let this patient know the results of her mammogram.  Read to her the impression.  Remind her if she has any symptoms she needs to be seen.

## 2023-01-17 ENCOUNTER — Other Ambulatory Visit (INDEPENDENT_AMBULATORY_CARE_PROVIDER_SITE_OTHER): Payer: Self-pay | Admitting: FAMILY MEDICINE

## 2023-01-17 ENCOUNTER — Encounter (INDEPENDENT_AMBULATORY_CARE_PROVIDER_SITE_OTHER): Payer: Self-pay

## 2023-01-17 DIAGNOSIS — Z1231 Encounter for screening mammogram for malignant neoplasm of breast: Secondary | ICD-10-CM

## 2023-01-17 NOTE — Telephone Encounter (Signed)
Patient made aware and states understanding and agreement. Encouraged to communicate with staff any questions or concerns regarding plan of care.

## 2023-01-29 ENCOUNTER — Other Ambulatory Visit (INDEPENDENT_AMBULATORY_CARE_PROVIDER_SITE_OTHER): Payer: Self-pay | Admitting: FAMILY MEDICINE

## 2023-01-29 MED ORDER — LISINOPRIL 30 MG TABLET
30.0000 mg | ORAL_TABLET | Freq: Every day | ORAL | 1 refills | Status: DC
Start: 2023-01-29 — End: 2023-02-27

## 2023-02-04 MED ORDER — ALLOPURINOL 100 MG TABLET
100.0000 mg | ORAL_TABLET | Freq: Two times a day (BID) | ORAL | 1 refills | Status: DC
Start: 2023-02-04 — End: 2023-05-10

## 2023-02-04 MED ORDER — GABAPENTIN 600 MG TABLET
600.0000 mg | ORAL_TABLET | Freq: Three times a day (TID) | ORAL | 0 refills | Status: DC
Start: 2023-02-04 — End: 2023-03-08

## 2023-02-06 ENCOUNTER — Other Ambulatory Visit (INDEPENDENT_AMBULATORY_CARE_PROVIDER_SITE_OTHER): Payer: Self-pay | Admitting: FAMILY MEDICINE

## 2023-02-06 MED ORDER — SEMAGLUTIDE 0.25 MG OR 0.5 MG (2 MG/3 ML) SUBCUTANEOUS PEN INJECTOR
0.5000 mg | PEN_INJECTOR | SUBCUTANEOUS | 0 refills | Status: DC
Start: 2023-02-06 — End: 2023-02-27

## 2023-02-12 ENCOUNTER — Other Ambulatory Visit (INDEPENDENT_AMBULATORY_CARE_PROVIDER_SITE_OTHER): Payer: Self-pay | Admitting: FAMILY MEDICINE

## 2023-02-12 MED ORDER — METFORMIN 500 MG TABLET
ORAL_TABLET | ORAL | 1 refills | Status: DC
Start: 2023-02-12 — End: 2023-05-15

## 2023-02-24 NOTE — Progress Notes (Unsigned)
Family Medicine, [facility]  [address]  [place] [address]  [phone]      Patient Name:  Gwendolyn Crawford  MRN:  [id number]  DOB:  10/20/1955    Date of Service:  02/27/2023    Chief Complaint: No chief complaint on file.      Subjective:  Gwendolyn Crawford is a 68 y.o. female who returns ***    Past Medical History:  Past Medical History:   Diagnosis Date    Allergic rhinitis     Anxiety     Arthritis     Depression     Diabetes mellitus, type 2 (CMS HCC)     Encounter for initial annual wellness visit (AWV) in Medicare patient 10/05/2020    Gout     Headache     Hypercholesterolemia     Hypertension     Hypothyroidism     Insomnia     PONV (postoperative nausea and vomiting)     RLS (restless legs syndrome)     Vitamin deficiency     Wears glasses             Past Surgical History:  Past Surgical History:   Procedure Laterality Date    COLONOSCOPY      ESOPHAGOGASTRODUODENOSCOPY      HX BREAST BIOPSY Left     HX BENIGN BREAST BX UNSURE WHICH SIDE    HX CHOLECYSTECTOMY      HX DENTAL EXTRACTION              Review of Systems:  Review of Systems   Constitutional: Negative for fever.   HENT: Negative for sore throat.    Eyes: Negative for blurred vision.   Respiratory: Negative for cough and shortness of breath.    Cardiovascular: Negative for chest pain.   Gastrointestinal: Negative for abdominal pain, nausea and vomiting.   Genitourinary: Negative for dysuria.   Musculoskeletal: Negative for falls.   Skin: Negative for rash.   Neurological: Negative for headaches.         Current Outpatient Medications   Medication Sig    acetaminophen (TYLENOL) 325 mg Oral Tablet Take 1 Tablet (325 mg total) by mouth Every 4 hours as needed for Pain    allopurinoL (ZYLOPRIM) 100 mg Oral Tablet Take 1 Tablet (100 mg total) by mouth Twice daily    ascorbic acid (VITAMIN C) 500 mg Oral Tablet Take 1 Tablet (500 mg total) by mouth Once a day for 90 days    aspirin (ECOTRIN) 81 mg Oral Tablet, Delayed  Release (E.C.) Take 1 Tablet (81 mg total) by mouth Once a day Pt reports Taking once weekly    atorvastatin (LIPITOR) 20 mg Oral Tablet TAKE ONE TABLET BY MOUTH EVERY EVENING FOR CHOLESTEROL    cholecalciferol, vitamin D3, 25 mcg (1,000 unit) Oral Tablet Take 1 Tablet (1,000 Units total) by mouth Once a day    cinnamon bark (CINNAMON ORAL) Take 1,000 mg by mouth bid    CINNAMON BARK EXTRACT ORAL Take 1,000 mg by mouth Twice daily    cyanocobalamin (VITAMIN B 12) 1,000 mcg Oral Tablet Take 1 Tablet (1,000 mcg total) by mouth Once a day    DULoxetine (CYMBALTA DR) 30 mg Oral Capsule, Delayed Release(E.C.) TAKE ONE CAPSULE BY MOUTH EVERY DAY    gabapentin (NEURONTIN) 600 mg Oral Tablet Take 1 Tablet (600 mg total) by mouth Three times a day    glimepiride (AMARYL) 1 mg Oral Tablet TAKE  1/2 TABLET (0.5MG ) BY MOUTH EVERY MORNING WITH BREAKFAST    levothyroxine (SYNTHROID) 100 mcg Oral Tablet TAKE 1 TABLET BY MOUTH ONCE DAILY ON AN EMPTY STOMACH FOR THYROID    Lisinopril (PRINIVIL) 30 mg Oral Tablet Take 1 Tablet (30 mg total) by mouth Once a day    loratadine (CLARITIN) 10 mg Oral Tablet Take 1 Tablet (10 mg total) by mouth Once a day    meclizine (ANTIVERT) 25 mg Oral Tablet Take 1 Tablet (25 mg total) by mouth Every 8 hours as needed    metFORMIN (GLUCOPHAGE) 500 mg Oral Tablet TAKE TWO TABLETS BY MOUTH TWO TIMES A DAY WITH FOOD FOR BLOOD SUGAR Indications: type 2 diabetes mellitus    naproxen Sodium (ALEVE) 220 mg Oral Tablet Take 1 Tablet (220 mg total) by mouth Every 12 hours as needed    semaglutide (OZEMPIC) 0.25 mg or 0.5 mg (2 mg/3 mL) Subcutaneous Pen Injector Inject 0.5 mg under the skin Every 7 days for 90 days Indications: type 2 diabetes mellitus       Allergies   Allergen Reactions    Zocor [Simvastatin] Myalgia    Crestor [Rosuvastatin]  Other Adverse Reaction (Add comment) and Myalgia     unknown       Objective:    There were no vitals taken for this visit.      There is no height or weight on file to  calculate BMI.     Physical Exam:  Constitutional: she is oriented to person, place, and time and well-developed, well-nourished, and in no distress.   HENT:   Head: Normocephalic.   Eyes: Pupils are equal, round, and reactive to light. Conjunctivae and EOM are normal. Right eye exhibits no discharge. Left eye exhibits no discharge.   Neck: Normal range of motion. Neck supple. No thyromegaly present.   Cardiovascular: Normal rate, regular rhythm, normal heart sounds and intact distal pulses. Exam reveals no friction rub.   No murmur heard.  Pulmonary/Chest: Breath sounds normal. No respiratory distress. she  has no wheezes. she  has no rales.   Musculoskeletal:         General: No tenderness or edema.   Lymphadenopathy:     she  has no cervical adenopathy.   Neurological she  is alert and oriented to person, place, and time.   Skin: Skin is warm and dry.   Psychiatric: Affect normal.    Diabetic foot exam:  No edema bilaterally. No ulcerations or lesions bilaterally. Pulses normal bilaterally.       Data reviewed:  A1C: 7.8  A1C Date: 10/15/2022  BASIC METABOLIC PANEL  Lab Results   Component Value Date    SODIUM 141 10/15/2022    POTASSIUM 4.2 10/15/2022    CHLORIDE 104 10/15/2022    CO2 28 10/15/2022    ANIONGAP 9 10/15/2022    BUN 11 10/15/2022    CREATININE 0.82 10/15/2022    BUNCRRATIO 13 10/15/2022    GFR 78 10/15/2022    CALCIUM 10.0 11/26/2022    GLUCOSE 109 (H) 10/15/2022    GLUCOSENF 164 (H) 02/10/2019         Recent Results (from the past 84166 hour(s))   MAMMO BILATERAL SCREENING-ADDL VIEWS/BREAST US AS REQ BY RAD    Collection Time: 01/11/23 10:14 AM    Narrative    Computer aided detection (CAD) technology has been applied to the standard   (CC and MLO) images.    3D tomosynthesis and 2D images were acquired  and reviewed.    FILMS COMPARED:  Compared to: 01/08/2022 MAMMO BILATERAL SCREENING-ADDL VIEWS/BREAST US AS   REQ BY RAD, 01/05/2021 MAMMO BILATERAL SCREENING-ADDL VIEWS/BREAST US AS   REQ BY RAD,  01/04/2020 MAMMO BILATERAL SCREENING-ADDL VIEWS/BREAST US AS   REQ BY RAD, 10/27/2018 MAMMO BILATERAL SCREENING-ADDL VIEWS/BREAST US AS   REQ BY RAD, and 10/24/2017 MAMMO BILAT SCREENING WO TOMO-ADDL VIEWS/BREAST   US AS REQ BY RAD    HISTORY:  Procedure: MAMMO BILATERAL SCREENING W TOMO-ADDL VIEWS/BREAST US AS REQ BY   RAD  Reason for exam: Other screening mammogram       FINDINGS:  The breasts are almost entirely fatty.    Bilateral  There is no evidence of suspicious masses, calcifications, or other   abnormal findings.      Impression    Follow up mammogram in 1 year is recommended for both breasts.    BI-RADS ATLAS category (overall): 1 - Negative    JWD    A negative x-ray should not delay biopsy if a dominant or clinically   suspicious mass is present.   4-8% of cancers are not identified by x-ray.    A negative report may reinforce clinical impression.  Adenosis and dense breasts may obscure an underlying neoplasm.  False positive reports average 6-10%.    [facility] Mammography Department  [place] , [place] [address]  [phone]    Radiologist location ID: [id number]          Social Determinants identified with potential adverse effects on health outcomes:  none           Assessment/Plan:  No diagnosis found.       Plan:        Treatment plan was discussed with patient and shared decision making employed. The patient was encouraged to call with any additional questions or concerns.     Discussed with patient effects and side effects of medications. Medication safety was discussed. A good faith effort was made to reconcile the patient's medications.   No orders of the defined types were placed in this encounter.        Follow up:  No follow-ups on file.    This note was partially generated using MModal Fluency Direct system, and there may be some incorrect words, spellings, and punctuation that were not noted in checking the note before saving.    Leonie Man, MD  02/24/2023,  14:38

## 2023-02-27 ENCOUNTER — Encounter (INDEPENDENT_AMBULATORY_CARE_PROVIDER_SITE_OTHER): Payer: Self-pay | Admitting: FAMILY MEDICINE

## 2023-02-27 ENCOUNTER — Ambulatory Visit: Payer: Medicare Other | Attending: FAMILY MEDICINE | Admitting: FAMILY MEDICINE

## 2023-02-27 ENCOUNTER — Encounter (INDEPENDENT_AMBULATORY_CARE_PROVIDER_SITE_OTHER): Payer: Self-pay

## 2023-02-27 ENCOUNTER — Ambulatory Visit (INDEPENDENT_AMBULATORY_CARE_PROVIDER_SITE_OTHER): Payer: Self-pay | Admitting: FAMILY MEDICINE

## 2023-02-27 ENCOUNTER — Ambulatory Visit (INDEPENDENT_AMBULATORY_CARE_PROVIDER_SITE_OTHER): Payer: Medicare Other

## 2023-02-27 ENCOUNTER — Ambulatory Visit (HOSPITAL_COMMUNITY): Payer: Medicare Other

## 2023-02-27 ENCOUNTER — Other Ambulatory Visit: Payer: Self-pay

## 2023-02-27 VITALS — BP 140/80 | HR 77 | Temp 98.7°F | Resp 18 | Wt 199.5 lb

## 2023-02-27 VITALS — BP 140/80 | HR 77 | Temp 98.8°F | Resp 18 | Ht 64.0 in | Wt 199.5 lb

## 2023-02-27 DIAGNOSIS — K5730 Diverticulosis of large intestine without perforation or abscess without bleeding: Secondary | ICD-10-CM

## 2023-02-27 DIAGNOSIS — Z1211 Encounter for screening for malignant neoplasm of colon: Secondary | ICD-10-CM

## 2023-02-27 DIAGNOSIS — E1142 Type 2 diabetes mellitus with diabetic polyneuropathy: Secondary | ICD-10-CM | POA: Insufficient documentation

## 2023-02-27 DIAGNOSIS — Z7984 Long term (current) use of oral hypoglycemic drugs: Secondary | ICD-10-CM | POA: Insufficient documentation

## 2023-02-27 DIAGNOSIS — E039 Hypothyroidism, unspecified: Secondary | ICD-10-CM

## 2023-02-27 DIAGNOSIS — F3341 Major depressive disorder, recurrent, in partial remission: Secondary | ICD-10-CM | POA: Insufficient documentation

## 2023-02-27 DIAGNOSIS — Z87891 Personal history of nicotine dependence: Secondary | ICD-10-CM | POA: Insufficient documentation

## 2023-02-27 DIAGNOSIS — I10 Essential (primary) hypertension: Secondary | ICD-10-CM

## 2023-02-27 DIAGNOSIS — Z7985 Long-term (current) use of injectable non-insulin antidiabetic drugs: Secondary | ICD-10-CM | POA: Insufficient documentation

## 2023-02-27 DIAGNOSIS — E782 Mixed hyperlipidemia: Secondary | ICD-10-CM | POA: Insufficient documentation

## 2023-02-27 DIAGNOSIS — F419 Anxiety disorder, unspecified: Secondary | ICD-10-CM

## 2023-02-27 DIAGNOSIS — Z0000 Encounter for general adult medical examination without abnormal findings: Secondary | ICD-10-CM

## 2023-02-27 DIAGNOSIS — G2581 Restless legs syndrome: Secondary | ICD-10-CM | POA: Insufficient documentation

## 2023-02-27 LAB — MICROALBUMIN/CREATININE RATIO, URINE, RANDOM
CREATININE RANDOM URINE: 26 mg/dL
MICROALBUMIN RANDOM URINE: <0.5 mg/dL

## 2023-02-27 MED ORDER — LISINOPRIL 40 MG TABLET
40.0000 mg | ORAL_TABLET | Freq: Every day | ORAL | 1 refills | Status: DC
Start: 2023-02-27 — End: 2023-05-27

## 2023-02-27 MED ORDER — OZEMPIC 1 MG/DOSE (4 MG/3 ML) SUBCUTANEOUS PEN INJECTOR
1.0000 mg | PEN_INJECTOR | SUBCUTANEOUS | 5 refills | Status: DC
Start: 2023-02-27 — End: 2023-05-28

## 2023-02-27 NOTE — Patient Instructions (Addendum)
Our Nurses on Call are available to assist you 24/7 at [phone]. These registered nurses provide immediate expert advice as a free service to you. Consider calling the nurse if you feel ill, have questions about your condition, or are unsure if you should seek medical help. If you are a Medicare Advantage member and need assistance, please call [phone].   Please continue annual wellness visits.    Continue healthy diet and exercise as tolerated   Medicare Preventive Services  Medicare coverage information Recommendation for YOU   Heart Disease and Diabetes   Lipid profile Every 5 years or more often if at risk for cardiovascular disease     Lab Results   Component Value Date    CHOLESTEROL 202 (H) 05/14/2022    HDLCHOL 47 (L) 05/14/2022    LDLCHOL 113 (H) 05/14/2022    LDLCHOLDIR 139 (H) 10/15/2022    TRIG 245 (H) 05/14/2022           Diabetes Screening    Yearly for those at risk for diabetes, 2 tests per year for those with prediabetes Last Glucose:      Diabetes Self Management Training or Medical Nutrition Therapy  For those with diabetes, up to 10 hrs initial training within a year, subsequent years up to 2 hrs of follow up training Optional for those with diabetes     Medical Nutrition Therapy  Three hours of one-on-one counseling in first year, two hours in subsequent years Optional for those with diabetes, kidney disease   Intensive Behavioral Therapy for Obesity  Face-to-face counseling, first month every week, month 2-6 every other week, month 7-12 every month if continued progress is documented Optional for those with Body Mass Index 30 or higher  Your Body mass index is 34.24 kg/m.   Tobacco Cessation (Quitting) Counseling   Covers up to 8 smoking and tobacco-use cessation counseling sessions in a 33-month period.    Optional for those that use tobacco   Cancer Screening Last Completion Date   Colorectal screening   For anyone age 68 to 86 or any age if high risk:  Screening Colonoscopy  every 10 yrs if low risk,  more frequent if higher risk  OR  Cologuard Stool DNA test once every 3 years OR  Fecal Occult Blood Testing yearly OR  Flexible  Sigmoidoscopy  every 5 yr OR  CT Colonography every 5 yrs    --10/03/2018  See below for due date if applicable.   Screening Pap Test   Recommended every 3 years for all women age 60 to 50, or every five years if combined with HPV test (routine screening not needed after total hysterectomy).  Medicare covers every 2 years or yearly if high risk.  Screening Pelvic Exam   Medicare covers every 2 years, yearly if high risk or childbearing age with abnormal Pap in last 3 yrs.   --05/20/2019  See below for due date if applicable.   Screening Mammogram   Recommended every 2 years for women age 33 to 51, or more frequent if you have a higher risk. Selectively recommended for women between 40-49 based on shared decisions about risk. Covered by Medicare up to every year for women age 59 or older --01/11/2023  See below for due date if applicable.         Lung Cancer Screening  Annual low dose computed tomography (LDCT scan) is recommended for those age 68-80 who smoked 20 pack-years and are current smokers or quit smoking within  past 15 years, after counseling by your doctor or nurse clinician about the possible benefits or harms.     See below for due date if applicable.   Vaccinations   Respiratory syncytial virus (RSV)  Age 2 years or older: Based on shared clinical decision-making with your provider.  Pneumococcal Vaccine  Recommended routinely age 61+ with one or two separate vaccines based on your risk. Recommended before age 59 if medical conditions with increased risk  Seasonal Influenza Vaccine  Once every flu season   Hepatitis B Vaccine  3 doses if risk (including anyone with diabetes or liver disease)  Shingles Vaccine  Two doses at age 39 or older  Diphtheria Tetanus Pertussis Vaccine  ONCE as adult, booster every 10 years     Immunization History    Administered Date(s) Administered   . Covid-19 Vaccine,Moderna(Spikevax),95mcg/0.5mL,12 yr+,Seasonal 12/27/2021   . Covid-19 Vaccine,Pfizer Bivalent,64mcg/0.3ml,12 yrs+ 01/30/2021   . Covid-19 Vaccine,Pfizer-BioNTech,Purple Top,51yrs+ 03/28/2019, 04/16/2019, 11/16/2019, 05/23/2020   . Flucelvax Influenza Vaccine, 6 months + 11/30/2019   . Influenza Vaccine, 6 month-adult 01/03/2004, 12/06/2010, 10/22/2017, 10/21/2018   . Influenza Vaccine, 65+ 10/25/2020, 10/30/2021, 10/15/2022   . PREVNAR 20 10/18/2020   . Pneumovax 03/10/2012   . Shingrix - Zoster Vaccine 02/26/2019, 06/16/2019   . Tetanus Toxoid/Diphtheria Toxoid/Acellular Pertussis Vaccine, Adsorbed 01/23/2019     Shingles vaccine and Diphtheria Tetanus Pertussis vaccines are available at pharmacies or local health department without a prescription.   Other Preventative Screening  Last Completion Date   Bone Densitometry   Screening: All females ages 44 and older every 10 years if initial screening normal. Postmenopausal women ages 31-64 need screening with one or more risk factor: previous fracture, parental hip fracture, current smoker, low body weight, excessive alcohol use, Rheumatoid Arthritis   For women with diagnosed Osteoporosis, follow up is recommended every 2 years or a frequency recommended by your provider.     --06/11/2022  See below for due date if applicable.     Glaucoma Screening   Yearly if in high risk group such as diabetes, family history, African American age 69+ or Hispanic American age 61+   See your eye care provider for screening.   Hepatitis C Screening   Recommended  for those born between ages 18-79 years.   --05/20/2019  See below for due date if applicable.     HIV Testing  Recommended routinely at least ONCE, covered every year for age 13 to 58 regardless of risk, and every year for age over 12 who ask for the test or higher risk. Yearly or up to 3 times in pregnancy         See below for due date if applicable.   Abdominal  Aortic Aneurysm Screening Ultrasound   Once with a family history of abdominal aortic aneurysms OR a female between65-75 and have smoked at least 100 cigarettes in your lifetime.         See below for due date if applicable.       Your Personalized Schedule for Preventive Tests   Health Maintenance: Pending and Last Completed       Date Due Completion Date    Diabetic Kidney Health Microalb/Cr Ratio Never done ---    Diabetic A1C 04/14/2023 10/15/2022    Diabetic Kidney Health eGFR 10/15/2023 10/15/2022    Diabetic Retinal Exam 01/04/2024 01/03/2022    Breast Cancer Screening 01/10/2025 01/11/2023    Colonoscopy 10/02/2028 10/03/2018    Adult Tdap-Td (2 - Td or Tdap)  01/22/2029 01/23/2019    Osteoporosis screening 06/10/2032 06/11/2022                For Information on Advanced Directives for Health Care:  [place]:  [email or web address]  [place], [place], [place], [place] General Information: [email or web address]

## 2023-02-27 NOTE — Result Encounter Note (Signed)
 I have reviewed these results and no active changes need to be done at this time. Please keep your follow up appointment and continue your current medications. If any problems go to the ER.

## 2023-02-27 NOTE — Progress Notes (Signed)
FAMILY MEDICINE, [facility]  [address]  [place] [address]  Operated by [facility]  Medicare Annual Wellness Visit    Name: Gwendolyn Crawford MRN:  [id number]   Date: 02/27/2023 Age: 68 y.o.       SUBJECTIVE:   Gwendolyn Crawford is a 68 y.o. female for presenting for Medicare Wellness exam.   I have reviewed and reconciled the medication list with the patient today.        02/27/2023    11:00 AM 02/07/2022     9:00 AM 06/28/2021     9:00 AM   Comprehensive Health Assessment-Adult   Do you wish to complete this form? Yes Yes Yes   During the past 4 weeks, how would you rate your health in general? Good Good Good   During the past 4 weeks, how much difficulty have you had doing your usual activities inside and outside your home because of medical or emotional problems? A little bit of difficulty Some difficulty No difficulty at all   During the past 4 weeks, was someone available to help you if you needed and wanted help? Yes, as much as I wanted Yes, as much as I wanted Yes, quite a bit   In the past year, how many times have you gone to the emergency department or been admitted to a hospital for a health problem? None None None   Are you generally satisfied with your sleep? Yes Yes Yes   Do you have enough money to buy things you need in everyday life, such as food, clothing, medicines, and housing? Yes, always Yes, always Yes, always   Can you get to places beyond walking distance without help?  (For example, can you drive your own car or travel alone on buses)? Yes Yes Yes   Do you fasten your seatbelt when you are in a car? Yes, usually Yes, usually Yes, usually   Do you exercise 20 minutes 3 or more days per week (such as walking, dancing, biking, mowing grass, swimming)? No, I usually don't exercise this much Yes, some of the time Yes, most of the time   How often do you eat food that is healthy (fruits, vegetables, lean meats) instead of unhealthy (sweets, fast  food, junk food, fatty foods)? Most of the time Some of the time Most of the time   Have your parents, brothers or sisters had any of the following problems before the age of 17? (check all that apply) Diabetes (sugar) Heart problems, or hardening of the arteries    How often do you have trouble taking medicines the eay you are told to take them? I always take them as prescribed I always take them as prescribed I always take them as prescribed   Do you need any help communicating with your doctors and nurses because of vision or hearing problems? No No No   During the past 12 months, have you experienced confusion or memory loss that is happening more often or is getting worse? Yes No No   Do you have one person you think of as your personal doctor (primary care provider or family doctor)? Yes Yes Yes   If you are seeing a Primary Care Provider (PCP) or family doctor. please list their name dr. Leonie Man Logen Heintzelman Leonie Man   Are you now also seeing any specialist physician(s) (such as eye doctor, foot doctor, skin doctor)? Yes No Yes   If you are seeing  a specialist for anything such as foot, eye, skin, etc.  please list their name(s) see care teams  Dr. Raynelle Dick, Dermatologist-Dr. Rexford Maus   How confident are you that you can control or manage most of your health problems? Very confident Somewhat confident Somewhat confident       I have reviewed and updated as appropriate the past medical, family and social history. 02/27/2023 as summarized below:  Past Medical History:   Diagnosis Date    Allergic rhinitis     Anxiety     Arthritis     Depression     Diabetes mellitus, type 2 (CMS HCC)     Encounter for initial annual wellness visit (AWV) in Medicare patient 10/05/2020    Gout     Headache     Hypercholesterolemia     Hypertension     Hypothyroidism     Insomnia     PONV (postoperative nausea and vomiting)     RLS (restless legs syndrome)     Vitamin deficiency     Wears glasses       Past Surgical History:   Procedure Laterality Date    Colonoscopy      Esophagogastroduodenoscopy      Hx breast biopsy Left     Hx cholecystectomy      Hx dental extraction       Current Outpatient Medications   Medication Sig    acetaminophen (TYLENOL) 325 mg Oral Tablet Take 1 Tablet (325 mg total) by mouth Every 4 hours as needed for Pain    allopurinoL (ZYLOPRIM) 100 mg Oral Tablet Take 1 Tablet (100 mg total) by mouth Twice daily    aspirin (ECOTRIN) 81 mg Oral Tablet, Delayed Release (E.C.) Take 1 Tablet (81 mg total) by mouth Once a day Pt reports Taking once weekly    atorvastatin (LIPITOR) 20 mg Oral Tablet TAKE ONE TABLET BY MOUTH EVERY EVENING FOR CHOLESTEROL    cholecalciferol, vitamin D3, 25 mcg (1,000 unit) Oral Tablet Take 1 Tablet (1,000 Units total) by mouth Once a day    cinnamon bark (CINNAMON ORAL) Take 1,000 mg by mouth bid    CINNAMON BARK EXTRACT ORAL Take 1,000 mg by mouth Twice daily    cyanocobalamin (VITAMIN B 12) 1,000 mcg Oral Tablet Take 1 Tablet (1,000 mcg total) by mouth Once a day    DULoxetine (CYMBALTA DR) 30 mg Oral Capsule, Delayed Release(E.C.) TAKE ONE CAPSULE BY MOUTH EVERY DAY    gabapentin (NEURONTIN) 600 mg Oral Tablet Take 1 Tablet (600 mg total) by mouth Three times a day    glimepiride (AMARYL) 1 mg Oral Tablet TAKE 1/2 TABLET (0.5MG ) BY MOUTH EVERY MORNING WITH BREAKFAST    levothyroxine (SYNTHROID) 100 mcg Oral Tablet TAKE 1 TABLET BY MOUTH ONCE DAILY ON AN EMPTY STOMACH FOR THYROID    Lisinopril (PRINIVIL) 30 mg Oral Tablet Take 1 Tablet (30 mg total) by mouth Once a day    loratadine (CLARITIN) 10 mg Oral Tablet Take 1 Tablet (10 mg total) by mouth Once a day    meclizine (ANTIVERT) 25 mg Oral Tablet Take 1 Tablet (25 mg total) by mouth Every 8 hours as needed    metFORMIN (GLUCOPHAGE) 500 mg Oral Tablet TAKE TWO TABLETS BY MOUTH TWO TIMES A DAY WITH FOOD FOR BLOOD SUGAR Indications: type 2 diabetes mellitus    naproxen Sodium (ALEVE) 220 mg Oral Tablet Take 1  Tablet (220 mg total) by mouth Every 12 hours as needed  semaglutide (OZEMPIC) 0.25 mg or 0.5 mg (2 mg/3 mL) Subcutaneous Pen Injector Inject 0.5 mg under the skin Every 7 days for 90 days Indications: type 2 diabetes mellitus     Family Medical History:       Problem Relation (Age of Onset)    Alzheimer's/Dementia Mother, Sister    COPD Sister    Kidney Disease Father    No Known Problems Brother, Maternal Grandmother, Maternal Grandfather, Paternal Grandmother, Paternal Grandfather, Daughter, Son, Maternal Aunt, Maternal Uncle, Paternal Aunt, Paternal Uncle, Other    Stroke Mother, Sister            Social History     Socioeconomic History    Marital status: Married   Tobacco Use    Smoking status: Former     Types: Cigarettes     Passive exposure: Past    Smokeless tobacco: Never   Vaping Use    Vaping status: Never Used   Substance and Sexual Activity    Alcohol use: Not Currently    Drug use: Never     Social Determinants of Health     Financial Resource Strain: Low Risk  (02/27/2023)    Financial Resource Strain     SDOH Financial: No   Transportation Needs: Low Risk  (02/27/2023)    Transportation Needs     SDOH Transportation: No   Social Connections: Medium Risk (02/27/2023)    Social Connections     SDOH Social Isolation: 1 or 2 times a week   Intimate Partner Violence: Low Risk  (02/27/2023)    Intimate Partner Violence     SDOH Domestic Violence: No   Housing Stability: Low Risk  (02/27/2023)    Housing Stability     SDOH Housing Situation: I have housing.     SDOH Housing Worry: No   Health Literacy: Low Risk  (11/26/2022)    Health Literacy     SDOH Health Literacy: Never   Employment Status: Low Risk  (02/27/2023)    Employment Status     SDOH Employment: Otherwise unemployed but not seeking work (ex. Consulting civil engineer, retired, disabled, unpaid primary care giver)         List of Current Health Care Providers   Care Team       PCP       Name Type Specialty Phone Number    Leonie Man, MD Physician [facility]ILY [facility]  [phone]              Care Team       Name Type Specialty Phone Number    Norberta Keens, [place] Not available OPT[facility] [phone]    Havery Moros Not available Not available Not available                      Health Maintenance   Topic Date Due    Diabetic Kidney Health Microalb/Cr Ratio  Never done    Medicare Annual Wellness Visit - Calendar Year Insurers  01/23/2023    Diabetic A1C  04/14/2023    Diabetic Kidney Health eGFR  10/15/2023    Diabetic Retinal Exam  01/04/2024    Breast Cancer Screening  01/10/2025    Colonoscopy  10/02/2028    Adult Tdap-Td (2 - Td or Tdap) 01/22/2029    Osteoporosis screening  06/10/2032    Hepatitis C screening  Completed    Influenza Vaccine  Completed    Shingles Vaccine  Completed    Pneumococcal Vaccination, Age 12+  Completed  Pap smear/HPV  Discontinued    Covid-19 Vaccine  Discontinued     Medicare Wellness Assessment   Medicare initial or wellness physical in the last year?: Yes  Advance Directives   Does patient have a living will or MPOA: Yes   Has patient provided [employer] with a copy?: Yes              Activities of Daily Living   Do you need help with dressing, bathing, or walking?: No   Do you need help with shopping, housekeeping, medications, or finances?: No   Do you have rugs in hallways, broken steps, or poor lighting?: No   Do you have grab bars in your bathroom, non-slip strips in your tub, and hand rails on your stairs?: Yes   Cognitive Function Screen (1=Yes, 0=No)   What is you age?: Incorrect   What is the time to the nearest hour?: Incorrect   What is the year?: Correct   What is the name of this clinic?: Incorrect   Can the patient recognize two persons (the doctor, the nurse, home help, etc.)?: Correct   What is the date of your birth? (day and month sufficient) : Correct   In what year did World War II end?: Incorrect   Who is the current president of the [place]?: Correct   Count from 20 down to 1?: Correct   What address did I  give you earlier?: Correct   Total Score: 6   Interpretation of Total Score: 4-6 Moderate Impairment   Fall Risk Screen   Do you feel unsteady when standing or walking?: No  Do you worry about falling?: No  Have you fallen in the past year?: No   Depression Screen     Little interest or pleasure in doing things.: Several Days  Feeling down, depressed, or hopeless: Several Days  PHQ 2 Total: 2  Trouble falling or staying asleep, or sleeping too much.: Not at all  Feeling tired or having little energy: Not at all  Poor appetite or overeating: Not at all  Feeling bad about yourself/ that you are a failure in the past 2 weeks?: Not at all  Trouble concentrating on things in the past 2 weeks?: Not at all  Moving/Speaking slowly or being fidgety or restless  in the past 2 weeks?: Not at all  Thoughts that you would be better off DEAD, or of hurting yourself in some way.: Not at all  PHQ 9 Total: 2  Interpretation of Total Score: 0-4 No depression     Pain Score   Pain Score:   0 - No pain    Substance Use-Abuse Screening     Tobacco Use     In Past 12 MONTHS, how often have you used any tobacco product (for example, cigarettes, e-cigarettes, cigars, pipes, or smokeless tobacco)?: Never     Alcohol use     In the PAST 12 MONTHS, how often have you had 5 (men)/4 (women) or more drinks containing alcohol in one day?: Never     Prescription Drug Use     In the PAST 12 months, how often have you used any prescription medications just for the feeling, more than prescribed, or that were not prescribed for you? Prescriptions may include: opioids, benzodiazepines, medications for ADHD: Never           Illicit Drug Use   In the PAST 12 MONTHS, how often have you used any drugs, including marijuana, cocaine or  crack, heroin, methamphetamine, hallucinogens, ecstasy/MDMA?: Never            Urine Incontinence Screen   Urinary Incontinence Screen  Do you ever leak urine when you don't want to?: YES                      OBJECTIVE:   BP  (!) 140/80   Pulse 77   Temp 37.1 C (98.8 F) (Thermal Scan)   Resp 18   Ht 1.626 m (5\' 4" )   Wt 90.5 kg (199 lb 8 oz)   SpO2 95%   BMI 34.24 kg/m        Other appropriate exam:    Health Maintenance Due   Topic Date Due    Diabetic Kidney Health Microalb/Cr Ratio  Never done    Medicare Annual Wellness Visit - Calendar Year Insurers  01/23/2023      ASSESSMENT & PLAN:   Assessment/Plan   1. Medicare annual wellness visit, subsequent    2. 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 Hyperlipidemia, mixed    5. Primary hypertension    6. RLS (restless legs syndrome)    7. Diverticulosis of colon    8. Acquired hypothyroidism    9. Anxiety    10. Screening for colon cancer       Identified Risk Factors/ Recommended Actions         Urinary Incontinence Plan of Care: Behavioral interventions with bladder training, pelvic floor muscle training, and/or prompted voiding  Lifestyle modifications  Reassess at follow up visit          No orders of the defined types were placed in this encounter.         The patient has been educated about risk factors and recommended preventive care. Written Prevention Plan completed/ updated and given to patient (see After Visit Summary).    Return in about 1 year (around 02/27/2024) for medicare wellness screening.    Hoyle Barr, LPN

## 2023-03-01 LAB — QUANTIFERON
QFT MITOGEN: >10 [IU]/mL
QFT NIL: 0.0507 [IU]/mL
QFT QUALITATIVE: NEGATIVE
QFT TB1: 0.09 [IU]/mL
QFT TB2: 0.06 [IU]/mL

## 2023-03-01 LAB — QUANTIFERON TB1: QFT TB1: 0.09 [IU]/mL

## 2023-03-01 LAB — QUANTIFERON TB MITOGEN: QFT MITOGEN: >10 [IU]/mL

## 2023-03-01 LAB — QUANTIFERON TB NIL: QFT NIL: 0.0507 [IU]/mL

## 2023-03-01 LAB — QUANTIFERON TB2: QFT TB2: 0.06 [IU]/mL

## 2023-03-01 NOTE — Result Encounter Note (Signed)
I have reviewed these results and no active changes need to be done at this time. Please keep your follow up appointment and continue your current medications. If any problems go to the ER.

## 2023-03-02 ENCOUNTER — Other Ambulatory Visit (INDEPENDENT_AMBULATORY_CARE_PROVIDER_SITE_OTHER): Payer: Self-pay | Admitting: FAMILY MEDICINE

## 2023-03-05 ENCOUNTER — Ambulatory Visit (HOSPITAL_COMMUNITY): Payer: Self-pay

## 2023-03-08 ENCOUNTER — Other Ambulatory Visit (INDEPENDENT_AMBULATORY_CARE_PROVIDER_SITE_OTHER): Payer: Self-pay | Admitting: FAMILY MEDICINE

## 2023-03-08 MED ORDER — GABAPENTIN 600 MG TABLET
600.0000 mg | ORAL_TABLET | Freq: Three times a day (TID) | ORAL | 0 refills | Status: DC
Start: 2023-03-08 — End: 2023-04-04

## 2023-03-08 NOTE — Telephone Encounter (Signed)
Dr. Greta Doom: pt called asking for this refill. Pt to see you 05/27/23. I don't see current pain agreement or uds on file. Thanks. Titus Mould, RN

## 2023-03-08 NOTE — Telephone Encounter (Signed)
Urine drug screen 4/24 and NC 10/23. Will update NC as able.

## 2023-03-11 ENCOUNTER — Other Ambulatory Visit (INDEPENDENT_AMBULATORY_CARE_PROVIDER_SITE_OTHER): Payer: Self-pay | Admitting: FAMILY MEDICINE

## 2023-03-11 MED ORDER — LEVOTHYROXINE 100 MCG TABLET
ORAL_TABLET | ORAL | 1 refills | Status: DC
Start: 2023-03-11 — End: 2023-09-09

## 2023-04-04 ENCOUNTER — Other Ambulatory Visit (INDEPENDENT_AMBULATORY_CARE_PROVIDER_SITE_OTHER): Payer: Self-pay | Admitting: FAMILY MEDICINE

## 2023-04-04 MED ORDER — GABAPENTIN 600 MG TABLET
600.0000 mg | ORAL_TABLET | Freq: Three times a day (TID) | ORAL | 0 refills | Status: DC
Start: 2023-04-04 — End: 2023-05-06

## 2023-04-15 ENCOUNTER — Other Ambulatory Visit (INDEPENDENT_AMBULATORY_CARE_PROVIDER_SITE_OTHER): Payer: Self-pay | Admitting: FAMILY MEDICINE

## 2023-04-15 NOTE — Nursing Note (Signed)
 Pt called back and reported she spoke with pharmacy and they had a refill for her. So I cancelled the rx request.

## 2023-05-06 ENCOUNTER — Other Ambulatory Visit (INDEPENDENT_AMBULATORY_CARE_PROVIDER_SITE_OTHER): Payer: Self-pay | Admitting: FAMILY MEDICINE

## 2023-05-06 MED ORDER — GABAPENTIN 600 MG TABLET
600.0000 mg | ORAL_TABLET | Freq: Three times a day (TID) | ORAL | 0 refills | Status: DC
Start: 2023-05-06 — End: 2023-06-03

## 2023-05-10 ENCOUNTER — Other Ambulatory Visit (INDEPENDENT_AMBULATORY_CARE_PROVIDER_SITE_OTHER): Payer: Self-pay | Admitting: FAMILY MEDICINE

## 2023-05-15 ENCOUNTER — Other Ambulatory Visit (INDEPENDENT_AMBULATORY_CARE_PROVIDER_SITE_OTHER): Payer: Self-pay | Admitting: FAMILY MEDICINE

## 2023-05-26 NOTE — Progress Notes (Unsigned)
 Family Medicine, [facility]  [address]  [place] [address]  [phone]      Patient Name:  Gwendolyn Crawford  MRN:  [id number]  DOB:  April 25, 1955    Date of Service:  05/27/2023    Chief Complaint:   Chief Complaint   Patient presents with    Follow Up 3 Months       Subjective:  Gwendolyn Crawford is a 68 y.o. female who returns for a f/u apt. She states that she has had an increased issues with her memory. She states that her mother and older sister had dementia. She states that her sugars and BP have been doing better. She states that her sugars have been under a 150. She states that she drives and has done well. She states that she has some nausea with the Ozempic . She is fasting today.     Past Medical History:  Past Medical History:   Diagnosis Date    Allergic rhinitis     Anxiety     Arthritis     Depression     Diabetes mellitus, type 2     Encounter for initial annual wellness visit (AWV) in Medicare patient 10/05/2020    Gout     Headache     Hypercholesterolemia     Hypertension     Hypothyroidism     Insomnia     PONV (postoperative nausea and vomiting)     RLS (restless legs syndrome)     Vitamin deficiency     Wears glasses             Past Surgical History:  Past Surgical History:   Procedure Laterality Date    COLONOSCOPY      ESOPHAGOGASTRODUODENOSCOPY      HX BREAST BIOPSY Left     HX BENIGN BREAST BX UNSURE WHICH SIDE    HX CHOLECYSTECTOMY      HX DENTAL EXTRACTION              Review of Systems:  Review of Systems   Constitutional: Negative for fever.   HENT: Negative for sore throat.    Eyes: Negative for blurred vision.   Respiratory: Negative for cough and shortness of breath.    Cardiovascular: Negative for chest pain.   Gastrointestinal: Negative for abdominal pain, nausea and vomiting.   Genitourinary: Negative for dysuria.   Musculoskeletal: Negative for falls.   Skin: Negative for rash.   Neurological: Negative for headaches.         Current Outpatient  Medications   Medication Sig    acetaminophen (TYLENOL) 325 mg Oral Tablet Take 1 Tablet (325 mg total) by mouth Every 4 hours as needed for Pain    allopurinoL  (ZYLOPRIM ) 100 mg Oral Tablet TAKE ONE TABLET BY MOUTH TWO TIMES A DAY    aspirin (ECOTRIN) 81 mg Oral Tablet, Delayed Release (E.C.) Take 1 Tablet (81 mg total) by mouth Daily Pt reports Taking once weekly    atorvastatin  (LIPITOR) 20 mg Oral Tablet TAKE ONE TABLET BY MOUTH EVERY EVENING FOR CHOLESTEROL    cholecalciferol, vitamin D3, 25 mcg (1,000 unit) Oral Tablet Take 1 Tablet (1,000 Units total) by mouth Daily    cinnamon bark (CINNAMON ORAL) Take 1,000 mg by mouth bid    cyanocobalamin (VITAMIN B 12) 1,000 mcg Oral Tablet Take 1 Tablet (1,000 mcg total) by mouth Daily    DULoxetine  (CYMBALTA  DR) 30 mg Oral Capsule, Delayed Release(E.C.) TAKE ONE CAPSULE BY  MOUTH EVERY DAY    gabapentin  (NEURONTIN ) 600 mg Oral Tablet Take 1 Tablet (600 mg total) by mouth Three times a day    glimepiride  (AMARYL ) 1 mg Oral Tablet Take 0.5 Tablets (0.5 mg total) by mouth Every morning with breakfast Indications: type 2 diabetes mellitus    levothyroxine  (SYNTHROID) 100 mcg Oral Tablet TAKE 1 TABLET BY MOUTH ONCE DAILY ON AN EMPTY STOMACH FOR THYROID     lisinopriL  (PRINIVIL ) 40 mg Oral Tablet Take 1 Tablet (40 mg total) by mouth Daily for 180 days Indications: high blood pressure    loratadine (CLARITIN) 10 mg Oral Tablet Take 1 Tablet (10 mg total) by mouth Daily    meclizine (ANTIVERT) 25 mg Oral Tablet Take 1 Tablet (25 mg total) by mouth Every 8 hours as needed    metFORMIN  (GLUCOPHAGE ) 500 mg Oral Tablet TAKE TWO TABLETS BY MOUTH TWO TIMES A DAY WITH FOOD FOR BLOOD SUGAR Indications: type 2 diabetes mellitus    naproxen Sodium (ALEVE) 220 mg Oral Tablet Take 1 Tablet (220 mg total) by mouth Every 12 hours as needed    semaglutide  (OZEMPIC ) 1 mg/dose (4 mg/3 mL) Subcutaneous Pen Injector Inject 1 mg under the skin Every 7 days       Allergies   Allergen Reactions     Zocor [Simvastatin] Myalgia    Crestor [Rosuvastatin]  Other Adverse Reaction (Add comment) and Myalgia     unknown       Objective:    BP 122/80 (Site: Left Arm, Patient Position: Sitting, Cuff Size: Adult Large)   Pulse 83   Temp 36.7 C (98 F) (Thermal Scan)   Resp 18   Ht 1.626 m (5\' 4" )   Wt 87.8 kg (193 lb 9.6 oz)   SpO2 98%   BMI 33.23 kg/m       Body mass index is 33.23 kg/m.     Physical Exam:  Constitutional: she is oriented to person, place, and time and well-developed, well-nourished, and in no distress.   HENT:   Head: Normocephalic.   Eyes: Pupils are equal, round, and reactive to light. Conjunctivae and EOM are normal. Right eye exhibits no discharge. Left eye exhibits no discharge.   Neck: Normal range of motion. Neck supple. No thyromegaly present.   Cardiovascular: Normal rate, regular rhythm, normal heart sounds and intact distal pulses. Exam reveals no friction rub.   No murmur heard.  Pulmonary/Chest: Breath sounds normal. No respiratory distress. she  has no wheezes. she  has no rales.   Musculoskeletal:         General: No tenderness or edema.   Lymphadenopathy:     she  has no cervical adenopathy.   Neurological she  is alert and oriented to person, place, and time.   Skin: Skin is warm and dry.   Psychiatric: Affect normal.    Diabetic foot exam:  No edema bilaterally. No ulcerations or lesions bilaterally. Pulses normal bilaterally.       Data reviewed:  A1C: 7.8  A1C Date: 10/15/2022  BASIC METABOLIC PANEL  Lab Results   Component Value Date    SODIUM 141 10/15/2022    POTASSIUM 4.2 10/15/2022    CHLORIDE 104 10/15/2022    CO2 28 10/15/2022    ANIONGAP 9 10/15/2022    BUN 11 10/15/2022    CREATININE 0.82 10/15/2022    BUNCRRATIO 13 10/15/2022    GFR 78 10/15/2022    CALCIUM 10.0 11/26/2022    GLUCOSE 109 (H) 10/15/2022  GLUCOSENF 164 (H) 02/10/2019         Recent Results (from the past 29528 hours)   MAMMO BILATERAL SCREENING-ADDL VIEWS/BREAST US  AS REQ BY RAD    Collection  Time: 01/11/23 10:14 AM    Narrative    Computer aided detection (CAD) technology has been applied to the standard   (CC and MLO) images.    3D tomosynthesis and 2D images were acquired and reviewed.    FILMS COMPARED:  Compared to: 01/08/2022 MAMMO BILATERAL SCREENING-ADDL VIEWS/BREAST US  AS   REQ BY RAD, 01/05/2021 MAMMO BILATERAL SCREENING-ADDL VIEWS/BREAST US  AS   REQ BY RAD, 01/04/2020 MAMMO BILATERAL SCREENING-ADDL VIEWS/BREAST US  AS   REQ BY RAD, 10/27/2018 MAMMO BILATERAL SCREENING-ADDL VIEWS/BREAST US  AS   REQ BY RAD, and 10/24/2017 MAMMO BILAT SCREENING WO TOMO-ADDL VIEWS/BREAST   US  AS REQ BY RAD    HISTORY:  Procedure: MAMMO BILATERAL SCREENING W TOMO-ADDL VIEWS/BREAST US  AS REQ BY   RAD  Reason for exam: Other screening mammogram       FINDINGS:  The breasts are almost entirely fatty.    Bilateral  There is no evidence of suspicious masses, calcifications, or other   abnormal findings.      Impression    Follow up mammogram in 1 year is recommended for both breasts.    BI-RADS ATLAS category (overall): 1 - Negative    JWD    A negative x-ray should not delay biopsy if a dominant or clinically   suspicious mass is present.   4-8% of cancers are not identified by x-ray.    A negative report may reinforce clinical impression.  Adenosis and dense breasts may obscure an underlying neoplasm.  False positive reports average 6-10%.    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 The patient was encouraged to call with any additional questions or concerns.     Discussed with patient effects and side effects of medications. Medication safety was discussed. A good faith effort was made to reconcile the patient's medications.   Orders Placed This Encounter    CBC/DIFF    THYROID  STIMULATING HORMONE (SENSITIVE TSH)    HGA1C (HEMOGLOBIN A1C WITH EST AVG GLUCOSE)    COMPREHENSIVE METABOLIC PNL, FASTING    LIPID PANEL    MICROALBUMIN/CREATININE RATIO, URINE, RANDOM    lisinopriL  (PRINIVIL ) 40 mg Oral Tablet    glimepiride  (AMARYL ) 1 mg Oral Tablet         Follow up:  No follow-ups on file.    This note was partially generated using  MModal Fluency Direct system, and there may be some incorrect words, spellings, and punctuation that were not noted in checking the note before saving.    Jenetta Misty, MD  05/26/2023, 18:57

## 2023-05-27 ENCOUNTER — Other Ambulatory Visit: Payer: Self-pay

## 2023-05-27 ENCOUNTER — Encounter (INDEPENDENT_AMBULATORY_CARE_PROVIDER_SITE_OTHER): Payer: Self-pay | Admitting: FAMILY MEDICINE

## 2023-05-27 ENCOUNTER — Ambulatory Visit: Payer: Self-pay | Attending: FAMILY MEDICINE | Admitting: FAMILY MEDICINE

## 2023-05-27 VITALS — BP 122/80 | HR 83 | Temp 98.0°F | Resp 18 | Ht 64.0 in | Wt 193.6 lb

## 2023-05-27 DIAGNOSIS — R413 Other amnesia: Secondary | ICD-10-CM | POA: Insufficient documentation

## 2023-05-27 DIAGNOSIS — Z8489 Family history of other specified conditions: Secondary | ICD-10-CM | POA: Insufficient documentation

## 2023-05-27 DIAGNOSIS — Z7985 Long-term (current) use of injectable non-insulin antidiabetic drugs: Secondary | ICD-10-CM | POA: Insufficient documentation

## 2023-05-27 DIAGNOSIS — E039 Hypothyroidism, unspecified: Secondary | ICD-10-CM | POA: Insufficient documentation

## 2023-05-27 DIAGNOSIS — G2581 Restless legs syndrome: Secondary | ICD-10-CM | POA: Insufficient documentation

## 2023-05-27 DIAGNOSIS — Z7984 Long term (current) use of oral hypoglycemic drugs: Secondary | ICD-10-CM | POA: Insufficient documentation

## 2023-05-27 DIAGNOSIS — E1142 Type 2 diabetes mellitus with diabetic polyneuropathy: Secondary | ICD-10-CM | POA: Insufficient documentation

## 2023-05-27 DIAGNOSIS — E782 Mixed hyperlipidemia: Secondary | ICD-10-CM | POA: Insufficient documentation

## 2023-05-27 DIAGNOSIS — I10 Essential (primary) hypertension: Secondary | ICD-10-CM | POA: Insufficient documentation

## 2023-05-27 LAB — COMPREHENSIVE METABOLIC PNL, FASTING
ALBUMIN: 4.4 g/dL (ref 3.4–4.8)
ALKALINE PHOSPHATASE: 67 U/L (ref 55–145)
ALT (SGPT): 24 U/L (ref <31)
ANION GAP: 12 mmol/L (ref 4–13)
AST (SGOT): 41 U/L — ABNORMAL HIGH (ref 11–34)
BILIRUBIN TOTAL: 1.7 mg/dL — ABNORMAL HIGH (ref 0.3–1.3)
BUN/CREA RATIO: 15 (ref 6–22)
BUN: 9 mg/dL (ref 8–25)
CALCIUM: 10 mg/dL (ref 8.6–10.3)
CHLORIDE: 107 mmol/L (ref 96–111)
CO2 TOTAL: 24 mmol/L (ref 23–31)
CREATININE: 0.62 mg/dL (ref 0.60–1.05)
ESTIMATED GFR - FEMALE: >90 mL/min/BSA (ref >=60)
GLUCOSE: 94 mg/dL (ref 70–99)
POTASSIUM: 4.2 mmol/L (ref 3.5–5.1)
PROTEIN TOTAL: 7.3 g/dL (ref 6.0–8.0)
SODIUM: 143 mmol/L (ref 136–145)

## 2023-05-27 LAB — CBC WITH DIFF
BASOPHIL #: <0.1 10*3/uL (ref <=0.20)
BASOPHIL %: 0.4 %
EOSINOPHIL #: 0.25 10*3/uL (ref <=0.50)
EOSINOPHIL %: 3.1 %
HCT: 42.2 % (ref 34.8–46.0)
HGB: 14.2 g/dL (ref 11.5–16.0)
IMMATURE GRANULOCYTE #: <0.1 10*3/uL (ref <0.10)
IMMATURE GRANULOCYTE %: 0.1 % (ref 0.0–1.0)
LYMPHOCYTE #: 3.36 10*3/uL (ref 1.00–4.80)
LYMPHOCYTE %: 42 %
MCH: 30.8 pg (ref 26.0–32.0)
MCHC: 33.6 g/dL (ref 31.0–35.5)
MCV: 91.5 fL (ref 78.0–100.0)
MONOCYTE #: 0.45 10*3/uL (ref 0.20–1.10)
MONOCYTE %: 5.6 %
MPV: 11.8 fL (ref 8.7–12.5)
NEUTROPHIL #: 3.9 10*3/uL (ref 1.50–7.70)
NEUTROPHIL %: 48.8 %
PLATELETS: 226 10*3/uL (ref 150–400)
RBC: 4.61 10*6/uL (ref 3.85–5.22)
RDW-CV: 13.5 % (ref 11.5–15.5)
WBC: 8 10*3/uL (ref 3.7–11.0)

## 2023-05-27 LAB — LIPID PANEL
CHOL/HDL RATIO: 2.4
CHOLESTEROL: 116 mg/dL (ref 100–200)
HDL CHOL: 49 mg/dL — ABNORMAL LOW (ref >=50)
LDL CALC: 47 mg/dL (ref <100)
NON-HDL: 67 mg/dL (ref <=190)
TRIGLYCERIDES: 112 mg/dL (ref <150)
VLDL CALC: 16 mg/dL (ref <30)

## 2023-05-27 LAB — MICROALBUMIN/CREATININE RATIO, URINE, RANDOM
CREATININE RANDOM URINE: 36.6 mg/dL (ref >=0.00)
MICROALBUMIN RANDOM URINE: <0.5 mg/dL

## 2023-05-27 LAB — THYROID STIMULATING HORMONE (SENSITIVE TSH): TSH: 1.981 u[IU]/mL (ref 0.350–4.940)

## 2023-05-27 LAB — HGA1C (HEMOGLOBIN A1C WITH EST AVG GLUCOSE)
ESTIMATED AVERAGE GLUCOSE: 154 mg/dL
HEMOGLOBIN A1C: 7 % — ABNORMAL HIGH (ref 4.0–6.0)

## 2023-05-27 MED ORDER — PENICILLIN V POTASSIUM 500 MG TABLET
500.0000 mg | ORAL_TABLET | Freq: Four times a day (QID) | ORAL | 0 refills | Status: DC
Start: 2023-05-27 — End: 2023-07-01

## 2023-05-27 MED ORDER — GLIMEPIRIDE 1 MG TABLET
0.5000 mg | ORAL_TABLET | Freq: Every morning | ORAL | 1 refills | Status: DC
Start: 2023-05-27 — End: 2023-09-02

## 2023-05-27 MED ORDER — LISINOPRIL 40 MG TABLET
40.0000 mg | ORAL_TABLET | Freq: Every day | ORAL | 1 refills | Status: DC
Start: 2023-05-27 — End: 2023-10-31

## 2023-05-27 NOTE — Nursing Note (Signed)
 Pt. Here for recheck. Pt. Needs refill on Amaryl  and  lisinopril . No new problems reported.Neel Buffone, RN

## 2023-05-28 ENCOUNTER — Encounter (INDEPENDENT_AMBULATORY_CARE_PROVIDER_SITE_OTHER): Payer: Self-pay | Admitting: FAMILY MEDICINE

## 2023-05-28 ENCOUNTER — Ambulatory Visit (INDEPENDENT_AMBULATORY_CARE_PROVIDER_SITE_OTHER): Payer: Self-pay | Admitting: FAMILY MEDICINE

## 2023-05-28 DIAGNOSIS — R413 Other amnesia: Secondary | ICD-10-CM

## 2023-05-28 MED ORDER — MOUNJARO 2.5 MG/0.5 ML SUBCUTANEOUS PEN INJECTOR
2.5000 mg | PEN_INJECTOR | SUBCUTANEOUS | 0 refills | Status: DC
Start: 2023-05-28 — End: 2023-08-21

## 2023-05-28 NOTE — Telephone Encounter (Signed)
 Fax received from [facility] stating prior authorization needed on Mounjaro 2.5ng/0.71mL. PA completed via covermymeds. Will await response.

## 2023-05-28 NOTE — Telephone Encounter (Signed)
 Per Elihu Grumet, Mounjaro 2.5mg /0.23mL approved until 01/22/2024. Patient aware.

## 2023-05-28 NOTE — Telephone Encounter (Signed)
 Referral placed for her to see neurology at [facility]. Call if she has not heard about her apt.

## 2023-05-28 NOTE — Telephone Encounter (Signed)
 Please let the patient know the results of her labs. Her lipids are controlled. Her cbc,TSH and urine microalbumin are normal. Her CMP has an elevated ast of 41 and total bili of 1.7. Check to see if she saw Dr.Conley for her fatty liver. Also her A1c was better at 7.0. We had discussed changing her to Mounjaro since she had the nausea. Will send in Mounjaro at the lower dose and step it up as we did the Ozempic . If she has not heard call the clinic it may need to be authorized.  If she has any side effects to it stop it and let me know. Her memory test showed some changes and would like her to see neurology. Which direction would she like to be seen. Options include Dr.Vaught in [place], [place] or [place]. Also check to see if she has any metal in her body. I did not see any documented and placed an order for a head mri. Let me know if she does have metal. If the patient has any questions let me know.

## 2023-05-28 NOTE — Telephone Encounter (Signed)
 I went over the lab results with pt. She verbalized understanding. She said she has not seen Dr. Virl Grimes in over a year. She would like to be referred to someone at [facility] for neuro. Pt states she has no metal in her body.

## 2023-05-29 LAB — VITAMIN B12: VITAMIN B 12: 1430 pg/mL — ABNORMAL HIGH (ref 200–900)

## 2023-05-29 NOTE — Telephone Encounter (Signed)
 Pt.notified

## 2023-05-30 NOTE — Result Encounter Note (Signed)
 I have reviewed these results and no active changes need to be done at this time. Please keep your follow up appointment and continue your current medications. If any problems go to the ER.

## 2023-06-03 ENCOUNTER — Telehealth (INDEPENDENT_AMBULATORY_CARE_PROVIDER_SITE_OTHER): Payer: Self-pay | Admitting: FAMILY MEDICINE

## 2023-06-03 ENCOUNTER — Other Ambulatory Visit (INDEPENDENT_AMBULATORY_CARE_PROVIDER_SITE_OTHER): Payer: Self-pay | Admitting: FAMILY MEDICINE

## 2023-06-03 MED ORDER — DULOXETINE 30 MG CAPSULE,DELAYED RELEASE
30.0000 mg | DELAYED_RELEASE_CAPSULE | Freq: Every day | ORAL | 1 refills | Status: DC
Start: 2023-06-03 — End: 2023-09-02

## 2023-06-03 MED ORDER — GABAPENTIN 600 MG TABLET
600.0000 mg | ORAL_TABLET | Freq: Three times a day (TID) | ORAL | 0 refills | Status: DC
Start: 2023-06-03 — End: 2023-07-04

## 2023-06-03 NOTE — Telephone Encounter (Signed)
 Contacted the patient to let her know that her MRI has been scheduled for Friday 06-07-2023 at 3pm. Patient is aware and had no questions.      06-03-23 @ 10:27am-BP

## 2023-06-07 ENCOUNTER — Ambulatory Visit
Admission: RE | Admit: 2023-06-07 | Discharge: 2023-06-07 | Disposition: A | Discharge status: Home / Self Care | Payer: Self-pay | Source: Ambulatory Visit | Attending: FAMILY MEDICINE | Admitting: FAMILY MEDICINE

## 2023-06-07 ENCOUNTER — Other Ambulatory Visit: Payer: Self-pay

## 2023-06-07 DIAGNOSIS — R413 Other amnesia: Secondary | ICD-10-CM | POA: Insufficient documentation

## 2023-06-07 MED ORDER — GADOTERIDOL 279.3 MG/ML INTRAVENOUS SOLUTION
15.0000 mL | INTRAVENOUS | Status: AC
Start: 2023-06-07 — End: 2023-06-07
  Administered 2023-06-07: 15 mL via INTRAVENOUS
  Filled 2023-06-07: qty 15

## 2023-06-11 ENCOUNTER — Encounter (INDEPENDENT_AMBULATORY_CARE_PROVIDER_SITE_OTHER): Payer: Self-pay

## 2023-06-12 NOTE — Telephone Encounter (Signed)
 Please let the patient know that her head MRI did not have any acute findings.

## 2023-06-13 NOTE — Telephone Encounter (Signed)
 Patient made aware and states understanding and agreement. Encouraged to communicate with staff any questions or concerns regarding plan of care.

## 2023-06-27 NOTE — Progress Notes (Signed)
 Family Medicine, [facility]  [address]  [place] [address]  [phone]      Patient Name:  Gwendolyn Crawford  MRN:  [id number]  DOB:  05-May-1955    Date of Service:  07/01/2023    Chief Complaint:   Chief Complaint   Patient presents with    Follow Up       Subjective:  Gwendolyn Crawford is a 68 y.o. female who returns for a f/u apt. She states that she has been doing well. She states that she has been tolerating the Mounjaro  without any problems. She had her head mri. Her apt with neurology at [facility] is not till March of next year. Will check to see if she can be seen earlier in [place] per discussion with the patient.     Past Medical History:  Past Medical History:   Diagnosis Date    Allergic rhinitis     Anxiety     Arthritis     Depression     Diabetes mellitus, type 2     Encounter for initial annual wellness visit (AWV) in Medicare patient 10/05/2020    Gout     Headache     Hypercholesterolemia     Hypertension     Hypothyroidism     Insomnia     PONV (postoperative nausea and vomiting)     RLS (restless legs syndrome)     Vitamin deficiency     Wears glasses             Past Surgical History:  Past Surgical History:   Procedure Laterality Date    COLONOSCOPY      ESOPHAGOGASTRODUODENOSCOPY      HX BREAST BIOPSY Left     HX BENIGN BREAST BX UNSURE WHICH SIDE    HX CHOLECYSTECTOMY      HX DENTAL EXTRACTION              Review of Systems:  Review of Systems   Constitutional: Negative for fever.   HENT: Negative for sore throat.    Eyes: Negative for blurred vision.   Respiratory: Negative for cough and shortness of breath.    Cardiovascular: Negative for chest pain.   Gastrointestinal: Negative for abdominal pain, nausea and vomiting.   Musculoskeletal: Negative for falls.   Skin: Negative for rash.   Neurological: Negative for headaches.         Current Outpatient Medications   Medication Sig    acetaminophen (TYLENOL) 325 mg Oral Tablet Take 1 Tablet (325 mg total) by mouth Every 4  hours as needed for Pain    allopurinoL  (ZYLOPRIM ) 100 mg Oral Tablet TAKE ONE TABLET BY MOUTH TWO TIMES A DAY    aspirin (ECOTRIN) 81 mg Oral Tablet, Delayed Release (E.C.) Take 1 Tablet (81 mg total) by mouth Daily Pt reports Taking once weekly    atorvastatin  (LIPITOR) 20 mg Oral Tablet TAKE ONE TABLET BY MOUTH EVERY EVENING FOR CHOLESTEROL    cholecalciferol, vitamin D3, 25 mcg (1,000 unit) Oral Tablet Take 1 Tablet (1,000 Units total) by mouth Daily    cinnamon bark (CINNAMON ORAL) Take 1,000 mg by mouth bid    cyanocobalamin (VITAMIN B 12) 1,000 mcg Oral Tablet Take 1 Tablet (1,000 mcg total) by mouth Daily    donepeziL (ARICEPT) 5 mg Oral Tablet Take 1 Tablet (5 mg total) by mouth Every night for 30 days Indications: memory loss    DULoxetine  (CYMBALTA  DR) 30 mg Oral Capsule,  Delayed Release(E.C.) Take 1 Capsule (30 mg total) by mouth Daily    gabapentin  (NEURONTIN ) 600 mg Oral Tablet Take 1 Tablet (600 mg total) by mouth Three times a day    glimepiride  (AMARYL ) 1 mg Oral Tablet Take 0.5 Tablets (0.5 mg total) by mouth Every morning with breakfast Indications: type 2 diabetes mellitus    levothyroxine  (SYNTHROID) 100 mcg Oral Tablet TAKE 1 TABLET BY MOUTH ONCE DAILY ON AN EMPTY STOMACH FOR THYROID     lisinopriL  (PRINIVIL ) 40 mg Oral Tablet Take 1 Tablet (40 mg total) by mouth Daily for 180 days Indications: high blood pressure    loratadine (CLARITIN) 10 mg Oral Tablet Take 1 Tablet (10 mg total) by mouth Daily    meclizine (ANTIVERT) 25 mg Oral Tablet Take 1 Tablet (25 mg total) by mouth Every 8 hours as needed    metFORMIN  (GLUCOPHAGE ) 500 mg Oral Tablet TAKE TWO TABLETS BY MOUTH TWO TIMES A DAY WITH FOOD FOR BLOOD SUGAR Indications: type 2 diabetes mellitus    naproxen Sodium (ALEVE) 220 mg Oral Tablet Take 1 Tablet (220 mg total) by mouth Every 12 hours as needed    tirzepatide  (MOUNJARO ) 2.5 mg/0.5 mL Subcutaneous Pen Injector Inject 0.5 mL (2.5 mg total) under the skin Every 7 days for 90 days  Indications: type 2 diabetes mellitus       Allergies   Allergen Reactions    Zocor [Simvastatin] Myalgia    Crestor [Rosuvastatin]  Other Adverse Reaction (Add comment) and Myalgia     unknown       Objective:    BP 130/82 (Patient Position: Sitting)   Pulse 77   Temp 36.7 C (98 F)   Resp 18   Ht 1.626 m (5\' 4" )   Wt 88.9 kg (196 lb)   SpO2 98%   BMI 33.64 kg/m       Body mass index is 33.64 kg/m.     Physical Exam:  Constitutional: she is oriented to person, place, and time and obese and in no distress.   HENT: tm and canals obscured by cerumen on the right and clear on the left. No erythema or exudate.   Head: Normocephalic.   Eyes: Pupils are equal, round, and reactive to light. Conjunctivae and EOM are normal. Right eye exhibits no discharge. Left eye exhibits no discharge.   Neck: Normal range of motion. Neck supple. No thyromegaly present.   Cardiovascular: Normal rate, regular rhythm, normal heart sounds and intact distal pulses. Exam reveals no friction rub.   No murmur heard.  Pulmonary/Chest: Breath sounds normal. No respiratory distress. she  has no wheezes. she  has no rales.   Musculoskeletal:         General: No tenderness or edema.   Lymphadenopathy:     she  has no cervical adenopathy.   Neurological she  is alert and oriented to person, place, and time.   Skin: Skin is warm and dry.   Psychiatric: Affect normal.    Diabetic foot exam:  No edema bilaterally. No ulcerations or lesions bilaterally. Pulses normal bilaterally.       Data reviewed:  A1C: 7  A1C Date: 05/27/2023  BASIC METABOLIC PANEL  Lab Results   Component Value Date    SODIUM 143 05/27/2023    POTASSIUM 4.2 05/27/2023    CHLORIDE 107 05/27/2023    CO2 24 05/27/2023    ANIONGAP 12 05/27/2023    BUN 9 05/27/2023    CREATININE 0.62 05/27/2023  BUNCRRATIO 15 05/27/2023    GFR >90 05/27/2023    CALCIUM 10.0 05/27/2023    GLUCOSE 94 05/27/2023    GLUCOSENF 164 (H) 02/10/2019         Recent Results (from the past 60454 hours)    MAMMO BILATERAL SCREENING-ADDL VIEWS/BREAST US  AS REQ BY RAD    Collection Time: 01/11/23 10:14 AM    Narrative    Computer aided detection (CAD) technology has been applied to the standard   (CC and MLO) images.    3D tomosynthesis and 2D images were acquired and reviewed.    FILMS COMPARED:  Compared to: 01/08/2022 MAMMO BILATERAL SCREENING-ADDL VIEWS/BREAST US  AS   REQ BY RAD, 01/05/2021 MAMMO BILATERAL SCREENING-ADDL VIEWS/BREAST US  AS   REQ BY RAD, 01/04/2020 MAMMO BILATERAL SCREENING-ADDL VIEWS/BREAST US  AS   REQ BY RAD, 10/27/2018 MAMMO BILATERAL SCREENING-ADDL VIEWS/BREAST US  AS   REQ BY RAD, and 10/24/2017 MAMMO BILAT SCREENING WO TOMO-ADDL VIEWS/BREAST   US  AS REQ BY RAD    HISTORY:  Procedure: MAMMO BILATERAL SCREENING W TOMO-ADDL VIEWS/BREAST US  AS REQ BY   RAD  Reason for exam: Other screening mammogram       FINDINGS:  The breasts are almost entirely fatty.    Bilateral  There is no evidence of suspicious masses, calcifications, or other   abnormal findings.      Impression    Follow up mammogram in 1 year is recommended for both breasts.    BI-RADS ATLAS category (overall): 1 - Negative    JWD    A negative x-ray should not delay biopsy if a dominant or clinically   suspicious mass is present.   4-8% of cancers are not identified by x-ray.    A negative report may reinforce clinical impression.  Adenosis and dense breasts may obscure an underlying neoplasm.  False positive reports average 6-10%.    [facility] Mammography Department  [place] , [place] [address]  [phone]    Radiologist location ID: [id number]          Social Determinants identified with potential adverse effects on health outcomes:  none           Assessment/Plan:  (R41.3) Memory loss  (primary encounter diagnosis)  Plan: Refer to External Provider    (E78.2) Hyperlipidemia, mixed    (I10) Primary hypertension    (G25.81) RLS (restless legs syndrome)  Plan: DRUG SCREEN, WITH CONFIRMATION, URINE    (E03.9)  Acquired hypothyroidism    (F41.9) Anxiety  Plan: DRUG SCREEN, WITH CONFIRMATION, URINE         Plan: Patient was given a copy of her head mri. She states that she had her eyes checked last year. She sees Dr.Pedergast. She was placed on Aricept. It is the only new medication. She knows to stop it if she has any side effects. She is to f/u in 2-3 months and prn. She was given slips to keep track of her BP and sugars.        Treatment plan was discussed with patient and shared decision making employed. The patient was encouraged to call with any additional questions or concerns.     Discussed with patient effects and side effects of medications. Medication safety was discussed. A good faith effort was made to reconcile the patient's medications.   Orders Placed This Encounter    DRUG SCREEN, WITH CONFIRMATION, URINE    Refer to External Provider    donepeziL (ARICEPT) 5 mg Oral Tablet  Follow up:  No follow-ups on file.    This note was partially generated using MModal Fluency Direct system, and there may be some incorrect words, spellings, and punctuation that were not noted in checking the note before saving.    Jenetta Misty, MD  06/27/2023, 21:10

## 2023-07-01 ENCOUNTER — Ambulatory Visit: Payer: Self-pay | Attending: FAMILY MEDICINE | Admitting: FAMILY MEDICINE

## 2023-07-01 ENCOUNTER — Encounter (INDEPENDENT_AMBULATORY_CARE_PROVIDER_SITE_OTHER): Payer: Self-pay | Admitting: FAMILY MEDICINE

## 2023-07-01 ENCOUNTER — Other Ambulatory Visit: Payer: Self-pay

## 2023-07-01 VITALS — BP 130/82 | HR 77 | Temp 98.0°F | Resp 18 | Ht 64.0 in | Wt 196.0 lb

## 2023-07-01 DIAGNOSIS — R413 Other amnesia: Secondary | ICD-10-CM | POA: Insufficient documentation

## 2023-07-01 DIAGNOSIS — I10 Essential (primary) hypertension: Secondary | ICD-10-CM | POA: Insufficient documentation

## 2023-07-01 DIAGNOSIS — Z79899 Other long term (current) drug therapy: Secondary | ICD-10-CM | POA: Insufficient documentation

## 2023-07-01 DIAGNOSIS — E039 Hypothyroidism, unspecified: Secondary | ICD-10-CM | POA: Insufficient documentation

## 2023-07-01 DIAGNOSIS — G2581 Restless legs syndrome: Secondary | ICD-10-CM | POA: Insufficient documentation

## 2023-07-01 DIAGNOSIS — F419 Anxiety disorder, unspecified: Secondary | ICD-10-CM | POA: Insufficient documentation

## 2023-07-01 DIAGNOSIS — E782 Mixed hyperlipidemia: Secondary | ICD-10-CM | POA: Insufficient documentation

## 2023-07-01 MED ORDER — DONEPEZIL 5 MG TABLET
5.0000 mg | ORAL_TABLET | Freq: Every evening | ORAL | 0 refills | Status: DC
Start: 2023-07-01 — End: 2023-07-31

## 2023-07-01 NOTE — Nursing Note (Signed)
 Pt here for 1 month follow up. She had her MRI done on 06/07/23. Denies any complaints.

## 2023-07-04 ENCOUNTER — Other Ambulatory Visit (INDEPENDENT_AMBULATORY_CARE_PROVIDER_SITE_OTHER): Payer: Self-pay | Admitting: FAMILY MEDICINE

## 2023-07-05 ENCOUNTER — Other Ambulatory Visit (INDEPENDENT_AMBULATORY_CARE_PROVIDER_SITE_OTHER): Payer: Self-pay | Admitting: FAMILY MEDICINE

## 2023-07-05 MED ORDER — GABAPENTIN 600 MG TABLET
600.0000 mg | ORAL_TABLET | Freq: Three times a day (TID) | ORAL | 0 refills | Status: DC
Start: 2023-07-05 — End: 2023-08-06

## 2023-07-05 NOTE — Telephone Encounter (Signed)
 Duplicate

## 2023-07-05 NOTE — Telephone Encounter (Signed)
 DUplicate

## 2023-07-09 ENCOUNTER — Other Ambulatory Visit (INDEPENDENT_AMBULATORY_CARE_PROVIDER_SITE_OTHER): Payer: Self-pay | Admitting: FAMILY MEDICINE

## 2023-07-31 ENCOUNTER — Other Ambulatory Visit (INDEPENDENT_AMBULATORY_CARE_PROVIDER_SITE_OTHER): Payer: Self-pay | Admitting: FAMILY MEDICINE

## 2023-08-06 ENCOUNTER — Other Ambulatory Visit (INDEPENDENT_AMBULATORY_CARE_PROVIDER_SITE_OTHER): Payer: Self-pay | Admitting: FAMILY MEDICINE

## 2023-08-06 MED ORDER — GABAPENTIN 600 MG TABLET
600.0000 mg | ORAL_TABLET | Freq: Three times a day (TID) | ORAL | 0 refills | Status: DC
Start: 2023-08-06 — End: 2023-09-02

## 2023-08-21 ENCOUNTER — Other Ambulatory Visit (INDEPENDENT_AMBULATORY_CARE_PROVIDER_SITE_OTHER): Payer: Self-pay | Admitting: FAMILY MEDICINE

## 2023-08-21 MED ORDER — MOUNJARO 2.5 MG/0.5 ML SUBCUTANEOUS PEN INJECTOR: 2.5000 mg | PEN_INJECTOR | SUBCUTANEOUS | 1 refills | Status: DC

## 2023-08-26 ENCOUNTER — Other Ambulatory Visit (INDEPENDENT_AMBULATORY_CARE_PROVIDER_SITE_OTHER): Payer: Self-pay | Admitting: FAMILY MEDICINE

## 2023-09-02 ENCOUNTER — Other Ambulatory Visit (INDEPENDENT_AMBULATORY_CARE_PROVIDER_SITE_OTHER): Payer: Self-pay | Admitting: FAMILY MEDICINE

## 2023-09-09 ENCOUNTER — Other Ambulatory Visit (INDEPENDENT_AMBULATORY_CARE_PROVIDER_SITE_OTHER): Payer: Self-pay | Admitting: FAMILY MEDICINE

## 2023-09-09 ENCOUNTER — Telehealth (INDEPENDENT_AMBULATORY_CARE_PROVIDER_SITE_OTHER): Payer: Self-pay | Admitting: FAMILY MEDICINE

## 2023-09-29 NOTE — Progress Notes (Unsigned)
 Family Medicine, [facility]  [address]  [place] [address]  [phone]      Patient Name:  Gwendolyn Crawford  MRN:  [id number]  DOB:  1955/03/27    Date of Service:  10/02/2023    Chief Complaint: No chief complaint on file.      Subjective:  Gwendolyn Crawford is a 68 y.o. female who returns ***    Past Medical History:  Past Medical History:   Diagnosis Date    Allergic rhinitis     Anxiety     Arthritis     Depression     Diabetes mellitus, type 2     Encounter for initial annual wellness visit (AWV) in Medicare patient 10/05/2020    Gout     Headache     Hypercholesterolemia     Hypertension     Hypothyroidism     Insomnia     PONV (postoperative nausea and vomiting)     RLS (restless legs syndrome)     Vitamin deficiency     Wears glasses             Past Surgical History:  Past Surgical History:   Procedure Laterality Date    COLONOSCOPY      ESOPHAGOGASTRODUODENOSCOPY      HX BREAST BIOPSY Left     HX BENIGN BREAST BX UNSURE WHICH SIDE    HX CHOLECYSTECTOMY      HX DENTAL EXTRACTION              Review of Systems:  Review of Systems   Constitutional: Negative for fever.   HENT: Negative for sore throat.    Eyes: Negative for blurred vision.   Respiratory: Negative for cough and shortness of breath.    Cardiovascular: Negative for chest pain.   Gastrointestinal: Negative for abdominal pain, nausea and vomiting.   Genitourinary: Negative for dysuria.   Musculoskeletal: Negative for falls.   Skin: Negative for rash.   Neurological: Negative for headaches.         Current Outpatient Medications   Medication Sig    acetaminophen (TYLENOL) 325 mg Oral Tablet Take 1 Tablet (325 mg total) by mouth Every 4 hours as needed for Pain    allopurinoL  (ZYLOPRIM ) 100 mg Oral Tablet TAKE ONE TABLET BY MOUTH TWO TIMES A DAY    aspirin (ECOTRIN) 81 mg Oral Tablet, Delayed Release (E.C.) Take 1 Tablet (81 mg total) by mouth Daily Pt reports Taking once weekly    atorvastatin  (LIPITOR) 20 mg Oral Tablet  TAKE ONE TABLET BY MOUTH EVERY EVENING FOR CHOLESTEROL    cholecalciferol, vitamin D3, 25 mcg (1,000 unit) Oral Tablet Take 1 Tablet (1,000 Units total) by mouth Daily    cinnamon bark (CINNAMON ORAL) Take 1,000 mg by mouth bid    cyanocobalamin (VITAMIN B 12) 1,000 mcg Oral Tablet Take 1 Tablet (1,000 mcg total) by mouth Daily    donepeziL  (ARICEPT ) 5 mg Oral Tablet Take 1 Tablet (5 mg total) by mouth Every night for 180 days    DULoxetine  (CYMBALTA  DR) 30 mg Oral Capsule, Delayed Release(E.C.) TAKE ONE CAPSULE BY MOUTH EVERY DAY    gabapentin  (NEURONTIN ) 600 mg Oral Tablet TAKE ONE TABLET BY MOUTH THREE TIMES A DAY    glimepiride  (AMARYL ) 1 mg Oral Tablet TAKE 1/2 TABLET BY MOUTH EVERY MORNING WITH BREAKFAST    levothyroxine  (SYNTHROID) 100 mcg Oral Tablet TAKE 1 TABLET BY MOUTH ONCE DAILY ON AN EMPTY STOMACH FOR  THYROID     lisinopriL  (PRINIVIL ) 40 mg Oral Tablet Take 1 Tablet (40 mg total) by mouth Daily for 180 days Indications: high blood pressure    loratadine (CLARITIN) 10 mg Oral Tablet Take 1 Tablet (10 mg total) by mouth Daily    meclizine (ANTIVERT) 25 mg Oral Tablet Take 1 Tablet (25 mg total) by mouth Every 8 hours as needed    metFORMIN  (GLUCOPHAGE ) 500 mg Oral Tablet TAKE TWO TABLETS BY MOUTH TWO TIMES A DAY WITH FOOD FOR BLOOD SUGAR Indications: type 2 diabetes mellitus    naproxen Sodium (ALEVE) 220 mg Oral Tablet Take 1 Tablet (220 mg total) by mouth Every 12 hours as needed    tirzepatide  (MOUNJARO ) 2.5 mg/0.5 mL Subcutaneous Pen Injector Inject 0.5 mL (2.5 mg total) under the skin Every 7 days for 180 days Indications: type 2 diabetes mellitus       Allergies[1]    Objective:    There were no vitals taken for this visit.      There is no height or weight on file to calculate BMI.     Physical Exam:  Constitutional: she is oriented to person, place, and time and well-developed, well-nourished, and in no distress.   HENT:   Head: Normocephalic.   Eyes: Pupils are equal, round, and reactive to  light. Conjunctivae and EOM are normal. Right eye exhibits no discharge. Left eye exhibits no discharge.   Neck: Normal range of motion. Neck supple. No thyromegaly present.   Cardiovascular: Normal rate, regular rhythm, normal heart sounds and intact distal pulses. Exam reveals no friction rub.   No murmur heard.  Pulmonary/Chest: Breath sounds normal. No respiratory distress. she  has no wheezes. she  has no rales.   Musculoskeletal:         General: No tenderness or edema.   Lymphadenopathy:     she  has no cervical adenopathy.   Neurological she  is alert and oriented to person, place, and time.   Skin: Skin is warm and dry.   Psychiatric: Affect normal.    Diabetic foot exam:  No edema bilaterally. No ulcerations or lesions bilaterally. Pulses normal bilaterally.       Data reviewed:  A1C: 7  A1C Date: 05/27/2023  BASIC METABOLIC PANEL  Lab Results   Component Value Date    SODIUM 143 05/27/2023    POTASSIUM 4.2 05/27/2023    CHLORIDE 107 05/27/2023    CO2 24 05/27/2023    ANIONGAP 12 05/27/2023    BUN 9 05/27/2023    CREATININE 0.62 05/27/2023    BUNCRRATIO 15 05/27/2023    GFR >90 05/27/2023    CALCIUM 10.0 05/27/2023    GLUCOSE 94 05/27/2023    GLUCOSENF 164 (H) 02/10/2019         Recent Results (from the past 82479 hours)   MAMMO BILATERAL SCREENING-ADDL VIEWS/BREAST US  AS REQ BY RAD    Collection Time: 01/11/23 10:14 AM    Narrative    Computer aided detection (CAD) technology has been applied to the standard   (CC and MLO) images.    3D tomosynthesis and 2D images were acquired and reviewed.    FILMS COMPARED:  Compared to: 01/08/2022 MAMMO BILATERAL SCREENING-ADDL VIEWS/BREAST US  AS   REQ BY RAD, 01/05/2021 MAMMO BILATERAL SCREENING-ADDL VIEWS/BREAST US  AS   REQ BY RAD, 01/04/2020 MAMMO BILATERAL SCREENING-ADDL VIEWS/BREAST US  AS   REQ BY RAD, 10/27/2018 MAMMO BILATERAL SCREENING-ADDL VIEWS/BREAST US  AS   REQ BY RAD, and 10/24/2017 MAMMO  BILAT SCREENING WO TOMO-ADDL VIEWS/BREAST   US  AS REQ BY  RAD    HISTORY:  Procedure: MAMMO BILATERAL SCREENING W TOMO-ADDL VIEWS/BREAST US  AS REQ BY   RAD  Reason for exam: Other screening mammogram       FINDINGS:  The breasts are almost entirely fatty.    Bilateral  There is no evidence of suspicious masses, calcifications, or other   abnormal findings.      Impression    Follow up mammogram in 1 year is recommended for both breasts.    BI-RADS ATLAS category (overall): 1 - Negative    JWD    A negative x-ray should not delay biopsy if a dominant or clinically   suspicious mass is present.   4-8% of cancers are not identified by x-ray.    A negative report may reinforce clinical impression.  Adenosis and dense breasts may obscure an underlying neoplasm.  False positive reports average 6-10%.    [facility] Mammography Department  [place] , [place] [address]  [phone]    Radiologist location ID: [id number]          Social Determinants identified with potential adverse effects on health outcomes:  None           Assessment/Plan:  No diagnosis found.       Plan:        Treatment plan was discussed with patient and shared decision making employed. The patient was encouraged to call with any additional questions or concerns.     Discussed with patient effects and side effects of medications. Medication safety was discussed. A good faith effort was made to reconcile the patient's medications.   No orders of the defined types were placed in this encounter.        Follow up:  No follow-ups on file.    This note was partially generated using MModal Fluency Direct system, and there may be some incorrect words, spellings, and punctuation that were not noted in checking the note before saving.    Ashby Cumber, MD  09/29/2023, 12:07       [1]   Allergies  Allergen Reactions    Zocor [Simvastatin] Myalgia    Crestor [Rosuvastatin]  Other Adverse Reaction (Add comment) and Myalgia     unknown

## 2023-10-02 ENCOUNTER — Encounter (INDEPENDENT_AMBULATORY_CARE_PROVIDER_SITE_OTHER): Payer: Self-pay | Admitting: FAMILY MEDICINE

## 2023-10-02 ENCOUNTER — Ambulatory Visit: Payer: Self-pay | Attending: FAMILY MEDICINE | Admitting: FAMILY MEDICINE

## 2023-10-02 ENCOUNTER — Other Ambulatory Visit: Payer: Self-pay

## 2023-10-02 VITALS — BP 139/82 | HR 66 | Temp 98.2°F | Ht 64.0 in | Wt 193.4 lb

## 2023-10-02 DIAGNOSIS — Z7984 Long term (current) use of oral hypoglycemic drugs: Secondary | ICD-10-CM | POA: Insufficient documentation

## 2023-10-02 DIAGNOSIS — E782 Mixed hyperlipidemia: Secondary | ICD-10-CM | POA: Insufficient documentation

## 2023-10-02 DIAGNOSIS — R413 Other amnesia: Secondary | ICD-10-CM | POA: Insufficient documentation

## 2023-10-02 DIAGNOSIS — E039 Hypothyroidism, unspecified: Secondary | ICD-10-CM

## 2023-10-02 DIAGNOSIS — M545 Low back pain, unspecified: Secondary | ICD-10-CM | POA: Insufficient documentation

## 2023-10-02 DIAGNOSIS — Z7985 Long-term (current) use of injectable non-insulin antidiabetic drugs: Secondary | ICD-10-CM | POA: Insufficient documentation

## 2023-10-02 DIAGNOSIS — Z79899 Other long term (current) drug therapy: Secondary | ICD-10-CM | POA: Insufficient documentation

## 2023-10-02 DIAGNOSIS — F3341 Major depressive disorder, recurrent, in partial remission: Secondary | ICD-10-CM

## 2023-10-02 DIAGNOSIS — I10 Essential (primary) hypertension: Secondary | ICD-10-CM | POA: Insufficient documentation

## 2023-10-02 DIAGNOSIS — F3340 Major depressive disorder, recurrent, in remission, unspecified: Secondary | ICD-10-CM | POA: Insufficient documentation

## 2023-10-02 DIAGNOSIS — G8929 Other chronic pain: Secondary | ICD-10-CM | POA: Insufficient documentation

## 2023-10-02 DIAGNOSIS — E1142 Type 2 diabetes mellitus with diabetic polyneuropathy: Secondary | ICD-10-CM | POA: Insufficient documentation

## 2023-10-02 MED ORDER — GABAPENTIN 600 MG TABLET
600.0000 mg | ORAL_TABLET | Freq: Three times a day (TID) | ORAL | 0 refills | Status: DC
Start: 2023-10-02 — End: 2023-10-31

## 2023-10-02 NOTE — Nursing Note (Signed)
 Pt. Here for recheck and needs refill on Gabapentin . No new problems reported.Shiniqua Groseclose, RN

## 2023-10-03 DIAGNOSIS — M545 Low back pain, unspecified: Secondary | ICD-10-CM | POA: Insufficient documentation

## 2023-10-03 DIAGNOSIS — R413 Other amnesia: Secondary | ICD-10-CM | POA: Insufficient documentation

## 2023-10-03 MED ORDER — MEMANTINE 5 MG TABLET
5.0000 mg | ORAL_TABLET | Freq: Every day | ORAL | 0 refills | Status: DC
Start: 2023-10-03 — End: 2023-11-13

## 2023-10-28 ENCOUNTER — Other Ambulatory Visit: Payer: Self-pay

## 2023-10-28 ENCOUNTER — Ambulatory Visit: Payer: Self-pay | Attending: NEUROLOGY | Admitting: NEUROLOGY

## 2023-10-28 DIAGNOSIS — R413 Other amnesia: Secondary | ICD-10-CM | POA: Insufficient documentation

## 2023-10-28 NOTE — H&P (Signed)
 NEUROLOGY, PHYSICIAN OFFICE BUILDING  [address]  [place] [facility] [address]  Operated by [facility]  Outpatient History and Physical    Date:  10/28/2023  Name: AMAR KEENUM  MRN: [id number]  Age:  68 y.o.  Referring Physician: Audrey Georgia, MD  [address]  [place],  [place] [address]    Consult: Yes    PCP:  Georgia Audrey, MD    CC:    Chief Complaint   Patient presents with    Memory Problems       History Obtained from:  Patient and husband, Quintin    HPI:     Gwendolyn Crawford is a 68 y.o., female with PMH DM, HTN, HLD, who presents to the Neurology clinic with her husband for memory changes.     -started having memory concerns for a few months, raymond thinks a year   -her husband has brought up concerns, he states she forgets things easily, thinks it has been slowly progressive   -she will repeat herself   -she denies word finding difficulty   -he thinks she sometimes has to stop and think   -denies personality changes  -mobility is good, ambulates independently, denies any recent falls, slipped in the kitchen over a year ago   -she lives at home with raymond  -she is independent in ADLs, manages her own medications, she pays the bills   -she does drive, has had more trouble with directions, denies any car accidents   -walks every day with her sister, does puzzles, sees family when they can    -sleeps well, snores sometimes, denies apnea   -has some anxiety, Cymbalta  (she thinks she is on this for neuropathy)   -her PCP put her on memantine  5 mg daily   -denies tobacco or alcohol use   -mom had dementia   -she is on gabapentin  600 mg TID       Past Medical History:    Past Medical History:   Diagnosis Date    Allergic rhinitis     Anxiety     Arthritis     Depression     Diabetes mellitus, type 2     Encounter for initial annual wellness visit (AWV) in Medicare patient 10/05/2020    Gout     Headache     Hypercholesterolemia     Hypertension     Hypothyroidism      Insomnia     PONV (postoperative nausea and vomiting)     RLS (restless legs syndrome)     Vitamin deficiency     Wears glasses            Medications:   Outpatient Medications Marked as Taking for the 10/28/23 encounter (Office Visit) with Mahlon Dess, MD   Medication Sig    acetaminophen (TYLENOL) 325 mg Oral Tablet Take 1 Tablet (325 mg total) by mouth Every 4 hours as needed for Pain    allopurinoL  (ZYLOPRIM ) 100 mg Oral Tablet TAKE ONE TABLET BY MOUTH TWO TIMES A DAY    aspirin (ECOTRIN) 81 mg Oral Tablet, Delayed Release (E.C.) Take 1 Tablet (81 mg total) by mouth Daily Pt reports Taking once weekly    atorvastatin  (LIPITOR) 20 mg Oral Tablet TAKE ONE TABLET BY MOUTH EVERY EVENING FOR CHOLESTEROL    cholecalciferol, vitamin D3, 25 mcg (1,000 unit) Oral Tablet Take 1 Tablet (1,000 Units total) by mouth Daily    cinnamon bark (CINNAMON ORAL) Take 1,000 mg by  mouth bid    cyanocobalamin (VITAMIN B 12) 1,000 mcg Oral Tablet Take 1 Tablet (1,000 mcg total) by mouth Daily    DULoxetine  (CYMBALTA  DR) 30 mg Oral Capsule, Delayed Release(E.C.) TAKE ONE CAPSULE BY MOUTH EVERY DAY    gabapentin  (NEURONTIN ) 600 mg Oral Tablet Take 1 Tablet (600 mg total) by mouth Three times a day    glimepiride  (AMARYL ) 1 mg Oral Tablet TAKE 1/2 TABLET BY MOUTH EVERY MORNING WITH BREAKFAST    levothyroxine  (SYNTHROID) 100 mcg Oral Tablet TAKE 1 TABLET BY MOUTH ONCE DAILY ON AN EMPTY STOMACH FOR THYROID     lisinopriL  (PRINIVIL ) 40 mg Oral Tablet Take 1 Tablet (40 mg total) by mouth Daily for 180 days Indications: high blood pressure    loratadine (CLARITIN) 10 mg Oral Tablet Take 1 Tablet (10 mg total) by mouth Daily    meclizine (ANTIVERT) 25 mg Oral Tablet Take 1 Tablet (25 mg total) by mouth Every 8 hours as needed    memantine  (NAMENDA ) 5 mg Oral Tablet Take 1 Tablet (5 mg total) by mouth Daily for 30 days    metFORMIN  (GLUCOPHAGE ) 500 mg Oral Tablet TAKE TWO TABLETS BY MOUTH TWO TIMES A DAY WITH FOOD FOR BLOOD SUGAR  Indications: type 2 diabetes mellitus    naproxen Sodium (ALEVE) 220 mg Oral Tablet Take 1 Tablet (220 mg total) by mouth Every 12 hours as needed    tirzepatide  (MOUNJARO ) 2.5 mg/0.5 mL Subcutaneous Pen Injector Inject 0.5 mL (2.5 mg total) under the skin Every 7 days for 180 days Indications: type 2 diabetes mellitus       Allergies: Allergies[1]    Family History:   Family Medical History:       Problem Relation (Age of Onset)    Alzheimer's/Dementia Mother, Sister    COPD Sister    Kidney Disease Father    No Known Problems Brother, Maternal Grandmother, Maternal Grandfather, Paternal Grandmother, Paternal Grandfather, Daughter, Son, Maternal Aunt, Maternal Uncle, Paternal Aunt, Paternal Uncle, Other    Stroke Mother, Sister            Surgical History:   Past Surgical History:   Procedure Laterality Date    COLONOSCOPY      ESOPHAGOGASTRODUODENOSCOPY      HX BREAST BIOPSY Left     HX BENIGN BREAST BX UNSURE WHICH SIDE    HX CHOLECYSTECTOMY      HX DENTAL EXTRACTION           Social History:    Social History     Socioeconomic History    Marital status: Married   Tobacco Use    Smoking status: Former     Types: Cigarettes     Passive exposure: Past    Smokeless tobacco: Never   Vaping Use    Vaping status: Never Used   Substance and Sexual Activity    Alcohol use: Not Currently    Drug use: Never   Other Topics Concern    Ability to Walk 1 Flight of Steps without SOB/CP No    Routine Exercise Yes    Ability To Do Own ADL's Yes     Social Determinants of Health     Financial Resource Strain: Low Risk (10/02/2023)    Financial Resource Strain     SDOH Financial: No   Transportation Needs: Low Risk (10/02/2023)    Transportation Needs     SDOH Transportation: No   Social Connections: Medium Risk (10/02/2023)    Social Connections  SDOH Social Isolation: 1 or 2 times a week   Intimate Partner Violence: Low Risk (10/02/2023)    Intimate Partner Violence     SDOH Domestic Violence: No   Housing Stability: Low Risk  (10/02/2023)    Housing Stability     SDOH Housing Situation: I have housing.     SDOH Housing Worry: No       Review of Systems  Constitutional- No fever  Eyes- No visual change  ENT- Hearing normal  CV- No chest pain  Resp- No Shortness of breath  GI- No diarrhea  GU- Bladder normal  MS- No Arthritis  Psych- anxiety  Endo- DM    PHYSICAL EXAM:    BP (!) 149/77 (Patient Position: Sitting)   Pulse 75   Ht 1.626 m (5' 4)   Wt 87.5 kg (192 lb 14.4 oz)   BMI 33.11 kg/m     General: no distress, pleasant  Neurological Exam: Much of exam is by observation  Mental status:  Alert and oriented   Memory slums 21/30  Knowledge: Good  Language: Normal  Speech: Normal  Cranial nerves: normal  Gait: Normal causal gait   Coordination: Coordination is normal without tremor or ataxia  Muscle tone: WNL  Muscle exam: normal  Reflexes: normal  Sensory: Sensory exam is symmetric and intact to LT      Personal review of  MRI brain w/wo contrast 06/07/23:  few scattered non specific white matter changes               Outside records: None       Assessment/Plan:     ICD-10-CM    1. Memory loss  R41.3 FOLATE     HELP LAB ORDER        Orders Placed This Encounter    FOLATE    HELP LAB ORDER       Ms. Rhatigan is a 68 yo female with PMH of  DM, HTN, HLD who presents to neurology for cognitive changes.     Discussed potential etiologies with the patient including both functional and structural problems which may interfere with overall cognition.     Cognitive decline  Mild cognitive impairment  - neuropsych testing deferred at this time  - MRI brain w/wo as above   - labs: TSH WNL, she is on vitamin B12, check folate, [facility] lab ordered   - discussed gabapentin  and side effects, could be contributing, but I do not think it is the overall cause   - pending above workup, consider memory clinic  - PCP started memantine  5 mg, discussed appropriate dosing, she wanted to hold off on changing at this time   - discussed healthy aging     Dorene Oiler, MD   10/28/23 at 12:43.       On the day of the encounter, a total of 45 minutes was spent on this patient encounter including review of historical information, examination, documentation and post-visit activities. The time documented excludes procedural time.                 [1]   Allergies  Allergen Reactions    Zocor [Simvastatin] Myalgia    Crestor [Rosuvastatin]  Other Adverse Reaction (Add comment) and Myalgia     unknown

## 2023-10-28 NOTE — Patient Instructions (Signed)
 Healthy brain aging can be encouraged by regular exercise, socialization, a proper diet and quality sleep.  In the clinical trials that prove the value of activities promote healthy brain aging, patients went to a senior center where they socialized and walked 45 minutes a day, five days a week and adhered to either a Mediterranean diet or MIND diet.    Both the MIND and Mediterranean diets are rich in leafy green vegetables, vegetables, fruit, nuts, and whole-grain foods. Olive and canola oil are preferred to butter.  Avoid cured meat, fried foods, and highly processed foods with added sugar, fructose, and glucose. Desert foods and snacks should be occasional treats. The amount of meat in a Mediterranean diet is less than typical of an American diet, and fish and poultry preferred to red meat.  The MIND Diet   10 foods to eat daily:  Green leafy vegetables  Other vegetables (especially with bright color)  Nuts  Berries (blueberries)  Beans  Whole grains  Fish  Poultry  Olive oil     5 foods to avoid:   Red meats  Butter and stick margarine  Cheese  Pastries and sweets  Fried or fast food.     Suggested guidelines:         Get at least three servings of whole grains per day       Eat a salad each day      Eat one other vegetable every day       Drink a glass of wine each day (optional)      Snack almost every day on nuts       Eat beans every other day       Consume poultry and berries at least twice a week       Consume fish at least once a week      Unhealthy foods are allowed, but less than one serving per week, except butter:      Less than 1 tablespoon a day of butter is allowed per day     Poor quality sleep is a treatable cause of decline in cognitive decline. The brain removes unwanted small products of metabolism using the bloodstream and larger waste using the glymphatic system (http://www.kaiser.com/)  The glymphatic system works best while we sleep.  To increase the probability  of high-quality sleep, it is important to avoid having screens (televisions, computers, tablets and smart phones) in the bedroom, for using the bedroom not to be used for things other than sleep and sex.  It is important to have the bedroom designed for comfort by being uncluttered, the right temperature, without bright lights, particularly late in the evening.  It is important if sleep is not forthcoming to get up and do something that is low key to become tired before trying to sleep again.  The onset of sleep is usually driven by the adenosine neurotransmitter, which increases as we fatigue with prolonged wakefulness.  The adenosine level goes down as we sleep and is interfered with by caffeine.  Sleep in the second half of the night sustained by melatonin.  Many patients try melatonin to encourage the onset of sleep without obvious benefit. Melatonin can help sustain sleep even if it does not make you sleepy. I recommend starting at 5 mg to 10 mg. There is little regulation controlling preparations. I recommend trying another supplier if the first one fails.  The following website gives tips about sleep hygiene: https://www.sleepfoundation.org/articles/sleep-hygiene.     General sleep  recommendations:  Sleep 7-9 hours at night - don't use diphenhydramine, which is the most common sleep aid!  Sleep Hygiene:  Maintain a regular sleep schedule  Set the alarm for rising time, but do not watch the clock  After lunch, avoid caffeine or other agents that may activate the nervous system (eg, chocolate, cocoa, over-the-counter weight-control aids, pain relievers, and certain allergy/sinus remedies)            Avoid daytime napping  Make the bedroom quiet, comfortable and uncluttered  Avoid alcohol within 3 hours of bedtime  Exercise during the day but avoid exercise during the 2 hours before bedtime  Wind down before bedtime, allowing >=1 hour to relax  Avoid excessive time in bed  Use the bed only for sleeping and sex.  Do not work, watch TV, or read in bed.  Eat a light snack before bed if hungry  Avoid using electronic devices close to bedtime

## 2023-10-31 ENCOUNTER — Other Ambulatory Visit (INDEPENDENT_AMBULATORY_CARE_PROVIDER_SITE_OTHER): Payer: Self-pay | Admitting: FAMILY MEDICINE

## 2023-10-31 MED ORDER — ATORVASTATIN 20 MG TABLET
20.0000 mg | ORAL_TABLET | Freq: Every evening | ORAL | 1 refills | Status: AC
Start: 2023-10-31 — End: 2024-04-28

## 2023-10-31 MED ORDER — LISINOPRIL 40 MG TABLET: 40.0000 mg | ORAL_TABLET | Freq: Every day | ORAL | 1 refills | Status: DC

## 2023-10-31 MED ORDER — GABAPENTIN 600 MG TABLET
600.0000 mg | ORAL_TABLET | Freq: Three times a day (TID) | ORAL | 0 refills | Status: AC
Start: 2023-10-31 — End: ?

## 2023-11-03 ENCOUNTER — Other Ambulatory Visit: Payer: Self-pay

## 2023-11-03 ENCOUNTER — Ambulatory Visit: Attending: NEUROLOGY

## 2023-11-03 DIAGNOSIS — R413 Other amnesia: Secondary | ICD-10-CM | POA: Insufficient documentation

## 2023-11-04 ENCOUNTER — Encounter (INDEPENDENT_AMBULATORY_CARE_PROVIDER_SITE_OTHER): Payer: Self-pay | Admitting: FAMILY MEDICINE

## 2023-11-04 LAB — FOLATE: FOLATE: 13 ng/mL (ref 7.0–31.0)

## 2023-11-04 NOTE — Nursing Note (Signed)
 Patient notified and will bring her 5 day BP results into the office on 11/12

## 2023-11-05 ENCOUNTER — Other Ambulatory Visit (INDEPENDENT_AMBULATORY_CARE_PROVIDER_SITE_OTHER): Payer: Self-pay | Admitting: FAMILY MEDICINE

## 2023-11-05 MED ORDER — DONEPEZIL 5 MG TABLET
5.0000 mg | ORAL_TABLET | Freq: Every evening | ORAL | 1 refills | Status: DC
Start: 2023-11-05 — End: 2024-05-03

## 2023-11-06 MED ORDER — DONEPEZIL 5 MG TABLET
10.0000 mg | ORAL_TABLET | Freq: Every evening | ORAL | 1 refills | Status: DC
Start: 2023-11-06 — End: 2023-11-18

## 2023-11-13 ENCOUNTER — Other Ambulatory Visit (INDEPENDENT_AMBULATORY_CARE_PROVIDER_SITE_OTHER): Payer: Self-pay | Admitting: FAMILY MEDICINE

## 2023-11-13 MED ORDER — MEMANTINE 5 MG TABLET
5.0000 mg | ORAL_TABLET | Freq: Every day | ORAL | 0 refills | Status: DC
Start: 2023-11-13 — End: 2023-12-04

## 2023-11-18 ENCOUNTER — Other Ambulatory Visit (HOSPITAL_BASED_OUTPATIENT_CLINIC_OR_DEPARTMENT_OTHER): Payer: Self-pay | Admitting: NEUROLOGY

## 2023-11-18 MED ORDER — DONEPEZIL 5 MG TABLET
ORAL_TABLET | ORAL | 0 refills | Status: AC
Start: 2023-11-18 — End: ?

## 2023-11-20 ENCOUNTER — Other Ambulatory Visit (INDEPENDENT_AMBULATORY_CARE_PROVIDER_SITE_OTHER): Payer: Self-pay | Admitting: FAMILY MEDICINE

## 2023-11-25 ENCOUNTER — Other Ambulatory Visit (INDEPENDENT_AMBULATORY_CARE_PROVIDER_SITE_OTHER): Payer: Self-pay | Admitting: FAMILY MEDICINE

## 2023-11-25 MED ORDER — GABAPENTIN 600 MG TABLET: 600.0000 mg | ORAL_TABLET | Freq: Three times a day (TID) | ORAL | 0 refills | Status: DC

## 2023-11-25 MED ORDER — GLIMEPIRIDE 1 MG TABLET: 0.5000 mg | ORAL_TABLET | Freq: Every morning | ORAL | 1 refills | Status: DC

## 2023-11-25 MED ORDER — DULOXETINE 30 MG CAPSULE,DELAYED RELEASE: 30.0000 mg | DELAYED_RELEASE_CAPSULE | Freq: Every day | ORAL | 1 refills | Status: DC

## 2023-11-25 NOTE — Telephone Encounter (Signed)
 Lisinopril  filled 10/31/23 for 6 months.

## 2023-11-28 ENCOUNTER — Other Ambulatory Visit (INDEPENDENT_AMBULATORY_CARE_PROVIDER_SITE_OTHER): Payer: Self-pay | Admitting: FAMILY MEDICINE

## 2023-11-28 NOTE — Telephone Encounter (Signed)
 Lisinopril  40mg  rx was erx'ed to [facility] 10-31-23 for 90 with 1 refill and pharmacy confirmed receipt.  Thanks. SW

## 2023-11-29 NOTE — Progress Notes (Signed)
 Family Medicine, [facility]  [address]  [place] [address]  [phone]      Patient Name:  Gwendolyn Crawford  MRN:  [id number]  DOB:  1955/09/06    Date of Service:  12/04/2023    Chief Complaint:   Chief Complaint   Patient presents with    Follow Up 3 Months       Subjective:  Gwendolyn Crawford is a 68 y.o. female who returns for a f/u apt. She states that she was seen by neurologist in oct. She states that her meds were not changed. She states that she had no side effects to the Namenda  or the Mounjaro . She thinks that the Namenda  has helped. She forgot to bring her BP and sugar logs.     Past Medical History:  Past Medical History:   Diagnosis Date    Allergic rhinitis     Anxiety     Arthritis     Depression     Diabetes mellitus, type 2     Encounter for initial annual wellness visit (AWV) in Medicare patient 10/05/2020    Gout     Headache     Hypercholesterolemia     Hypertension     Hypothyroidism     Insomnia     PONV (postoperative nausea and vomiting)     RLS (restless legs syndrome)     Vitamin deficiency     Wears glasses             Past Surgical History:  Past Surgical History:   Procedure Laterality Date    COLONOSCOPY      ESOPHAGOGASTRODUODENOSCOPY      HX BREAST BIOPSY Left     HX BENIGN BREAST BX UNSURE WHICH SIDE    HX CHOLECYSTECTOMY      HX DENTAL EXTRACTION              Review of Systems:  Review of Systems   Constitutional: Negative for fever.   HENT: Negative for sore throat.    Eyes: Negative for blurred vision.   Respiratory: Negative for cough and shortness of breath.    Cardiovascular: Negative for chest pain.   Gastrointestinal: Negative for abdominal pain, nausea and vomiting.   Genitourinary: Negative for dysuria.   Musculoskeletal: Negative for falls.   Skin: Negative for rash.   Neurological: Negative for syncope,near syncope.         Current Outpatient Medications   Medication Sig    acetaminophen (TYLENOL) 325 mg Oral Tablet Take 1 Tablet (325 mg  total) by mouth Every 4 hours as needed for Pain    allopurinoL  (ZYLOPRIM ) 100 mg Oral Tablet TAKE ONE TABLET BY MOUTH TWO TIMES A DAY    aspirin (ECOTRIN) 81 mg Oral Tablet, Delayed Release (E.C.) Take 1 Tablet (81 mg total) by mouth Daily Pt reports Taking once weekly    atorvastatin  (LIPITOR) 20 mg Oral Tablet Take 1 Tablet (20 mg total) by mouth Every evening Indications: excessive fat in the blood    cholecalciferol, vitamin D3, 25 mcg (1,000 unit) Oral Tablet Take 1 Tablet (1,000 Units total) by mouth Daily    cinnamon bark (CINNAMON ORAL) Take 1,000 mg by mouth bid    cyanocobalamin (VITAMIN B 12) 1,000 mcg Oral Tablet Take 1 Tablet (1,000 mcg total) by mouth Daily    donepeziL  (ARICEPT ) 5 mg Oral Tablet Take 5 mg nightly for a month then increase to 10 mg nightly.    DULoxetine  (  CYMBALTA  DR) 30 mg Oral Capsule, Delayed Release(E.C.) Take 1 Capsule (30 mg total) by mouth Daily    gabapentin  (NEURONTIN ) 600 mg Oral Tablet Take 1 Tablet (600 mg total) by mouth Three times a day    glimepiride  (AMARYL ) 1 mg Oral Tablet Take 0.5 Tablets (0.5 mg total) by mouth Every morning Indications: type 2 diabetes mellitus    levothyroxine  (SYNTHROID) 100 mcg Oral Tablet TAKE 1 TABLET BY MOUTH ONCE DAILY ON AN EMPTY STOMACH FOR THYROID     lisinopriL  (PRINIVIL ) 40 mg Oral Tablet Take 1 Tablet (40 mg total) by mouth Daily Indications: high blood pressure    loratadine (CLARITIN) 10 mg Oral Tablet Take 1 Tablet (10 mg total) by mouth Daily    meclizine (ANTIVERT) 25 mg Oral Tablet Take 1 Tablet (25 mg total) by mouth Every 8 hours as needed    memantine  (NAMENDA ) 5 mg Oral Tablet Take 1 Tablet (5 mg total) by mouth Twice daily for 180 days Indications: dementia    metFORMIN  (GLUCOPHAGE ) 500 mg Oral Tablet TAKE TWO TABLETS BY MOUTH TWO TIMES A DAY WITH FOOD FOR BLOOD SUGAR    naproxen Sodium (ALEVE) 220 mg Oral Tablet Take 1 Tablet (220 mg total) by mouth Every 12 hours as needed    tirzepatide  (MOUNJARO ) 2.5 mg/0.5 mL  Subcutaneous Pen Injector Inject 0.5 mL (2.5 mg total) under the skin Every 7 days for 180 days Indications: type 2 diabetes mellitus       Allergies[1]    Objective:    BP 128/68 (Site: Left Arm, Patient Position: Sitting, Cuff Size: Adult)   Pulse 80   Temp 36.7 C (98 F) (Tympanic)   Resp 18   Wt 86.8 kg (191 lb 6.4 oz)   SpO2 97%   BMI 32.85 kg/m       Body mass index is 32.85 kg/m.     Physical Exam:  Constitutional: she is oriented to person, place, and time and obese and in no distress.   HENT: tm and canal partially obscured by cerumen on the right but clear on the left. No erythema or exudate.   Head: Normocephalic.   Eyes: Pupils are equal, round, and reactive to light. Conjunctivae and EOM are normal. Right eye exhibits no discharge. Left eye exhibits no discharge.   Neck: Normal range of motion. Neck supple. No thyromegaly present.   Cardiovascular: Normal rate, regular rhythm, normal heart sounds and intact distal pulses. Exam reveals no friction rub.   1/6 sys murmur heard.  Pulmonary/Chest: Breath sounds normal. No respiratory distress. she  has no wheezes. she  has no rales.   Musculoskeletal:         General: No tenderness or edema.   Lymphadenopathy:     she  has no cervical adenopathy.   Neurological she  is alert and oriented to person, place, and time.   Skin: Skin is warm and dry.   Psychiatric: Affect normal.    Diabetic foot exam:  No edema bilaterally. No ulcerations or lesions bilaterally. Pulses normal bilaterally.       Data reviewed:  A1C: 7  A1C Date: 05/27/2023  BASIC METABOLIC PANEL  Lab Results   Component Value Date    SODIUM 143 05/27/2023    POTASSIUM 4.2 05/27/2023    CHLORIDE 107 05/27/2023    CO2 24 05/27/2023    ANIONGAP 12 05/27/2023    BUN 9 05/27/2023    CREATININE 0.62 05/27/2023    BUNCRRATIO 15 05/27/2023  GFR >90 05/27/2023    CALCIUM 10.0 05/27/2023    GLUCOSE 94 05/27/2023    GLUCOSENF 164 (H) 02/10/2019         Recent Results (from the past 82479 hours)    MAMMO BILATERAL SCREENING-ADDL VIEWS/BREAST US  AS REQ BY RAD    Collection Time: 01/11/23 10:14 AM    Narrative    Computer aided detection (CAD) technology has been applied to the standard   (CC and MLO) images.    3D tomosynthesis and 2D images were acquired and reviewed.    FILMS COMPARED:  Compared to: 01/08/2022 MAMMO BILATERAL SCREENING-ADDL VIEWS/BREAST US  AS   REQ BY RAD, 01/05/2021 MAMMO BILATERAL SCREENING-ADDL VIEWS/BREAST US  AS   REQ BY RAD, 01/04/2020 MAMMO BILATERAL SCREENING-ADDL VIEWS/BREAST US  AS   REQ BY RAD, 10/27/2018 MAMMO BILATERAL SCREENING-ADDL VIEWS/BREAST US  AS   REQ BY RAD, and 10/24/2017 MAMMO BILAT SCREENING WO TOMO-ADDL VIEWS/BREAST   US  AS REQ BY RAD    HISTORY:  Procedure: MAMMO BILATERAL SCREENING W TOMO-ADDL VIEWS/BREAST US  AS REQ BY   RAD  Reason for exam: Other screening mammogram       FINDINGS:  The breasts are almost entirely fatty.    Bilateral  There is no evidence of suspicious masses, calcifications, or other   abnormal findings.      Impression    Follow up mammogram in 1 year is recommended for both breasts.    BI-RADS ATLAS category (overall): 1 - Negative    JWD    A negative x-ray should not delay biopsy if a dominant or clinically   suspicious mass is present.   4-8% of cancers are not identified by x-ray.    A negative report may reinforce clinical impression.  Adenosis and dense breasts may obscure an underlying neoplasm.  False positive reports average 6-10%.    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 (CMS HCC)  (primary encounter diagnosis)  Plan: HGA1C (HEMOGLOBIN A1C WITH EST AVG GLUCOSE),         BASIC METABOLIC PANEL, FLUAD  (65+) Flu         Vaccine,0.73mL IM (Admin)    (I10)  Primary hypertension  Plan: HGA1C (HEMOGLOBIN A1C WITH EST AVG GLUCOSE),         BASIC METABOLIC PANEL, FLUAD  (65+) Flu         Vaccine,0.41mL IM (Admin)    (E78.2) Hyperlipidemia, mixed  Plan: HGA1C (HEMOGLOBIN A1C WITH EST AVG GLUCOSE),         BASIC METABOLIC PANEL, FLUAD  (65+) Flu         Vaccine,0.62mL IM (Admin)    (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 She states that her sugars are doing well. Will leave her on the current dose of Moujaro. Will order an echo. Testing per neurology positive for increased amyloid. She is to f/u in 4 months and prn.        Treatment plan was discussed with patient and shared decision making employed. The patient was encouraged to call with any additional questions or concerns.     Discussed with patient effects and side effects of medications. Medication safety was discussed. A good faith effort was made to reconcile the patient's medications.   Orders Placed This Encounter    FLUAD  (65+) Flu Vaccine,0.41mL IM (Admin)    HGA1C (HEMOGLOBIN A1C WITH EST AVG GLUCOSE)    BASIC METABOLIC PANEL    TRANSTHORACIC ECHOCARDIOGRAM - ADULT    memantine  (NAMENDA ) 5 mg Oral Tablet         Follow up:  Return in about 4 months (around 04/02/2024), or if symptoms worsen or fail to improve.    This note was partially generated using MModal Fluency Direct system, and there may be some incorrect words, spellings, and punctuation that were not noted in checking the note before saving.    Ashby Cumber, MD  11/29/2023, 10:54         [1]   Allergies  Allergen Reactions    Zocor  [Simvastatin] Myalgia    Crestor [Rosuvastatin]  Other Adverse Reaction (Add comment) and Myalgia     unknown

## 2023-12-04 ENCOUNTER — Ambulatory Visit: Payer: Self-pay | Attending: FAMILY MEDICINE | Admitting: FAMILY MEDICINE

## 2023-12-04 ENCOUNTER — Other Ambulatory Visit: Payer: Self-pay

## 2023-12-04 ENCOUNTER — Encounter (INDEPENDENT_AMBULATORY_CARE_PROVIDER_SITE_OTHER): Payer: Self-pay | Admitting: FAMILY MEDICINE

## 2023-12-04 VITALS — BP 128/68 | HR 80 | Temp 98.0°F | Resp 18 | Wt 191.4 lb

## 2023-12-04 DIAGNOSIS — E782 Mixed hyperlipidemia: Secondary | ICD-10-CM | POA: Insufficient documentation

## 2023-12-04 DIAGNOSIS — R011 Cardiac murmur, unspecified: Secondary | ICD-10-CM | POA: Insufficient documentation

## 2023-12-04 DIAGNOSIS — E1142 Type 2 diabetes mellitus with diabetic polyneuropathy: Secondary | ICD-10-CM | POA: Insufficient documentation

## 2023-12-04 DIAGNOSIS — Z23 Encounter for immunization: Secondary | ICD-10-CM | POA: Insufficient documentation

## 2023-12-04 DIAGNOSIS — G301 Alzheimer's disease with late onset: Secondary | ICD-10-CM | POA: Insufficient documentation

## 2023-12-04 DIAGNOSIS — I10 Essential (primary) hypertension: Secondary | ICD-10-CM | POA: Insufficient documentation

## 2023-12-04 DIAGNOSIS — F3340 Major depressive disorder, recurrent, in remission, unspecified: Secondary | ICD-10-CM | POA: Insufficient documentation

## 2023-12-04 LAB — HGA1C (HEMOGLOBIN A1C WITH EST AVG GLUCOSE)
ESTIMATED AVERAGE GLUCOSE: 151 mg/dL
HEMOGLOBIN A1C: 6.9 % — ABNORMAL HIGH (ref 4.0–6.0)

## 2023-12-04 LAB — BASIC METABOLIC PANEL
ANION GAP: 9 mmol/L (ref 4–13)
BUN/CREA RATIO: 14 (ref 6–22)
BUN: 9 mg/dL (ref 8–25)
CALCIUM: 9.8 mg/dL (ref 8.6–10.3)
CHLORIDE: 104 mmol/L (ref 96–111)
CO2 TOTAL: 27 mmol/L (ref 23–31)
CREATININE: 0.66 mg/dL (ref 0.60–1.05)
GLUCOSE: 95 mg/dL (ref 65–125)
POTASSIUM: 4 mmol/L (ref 3.5–5.1)
SODIUM: 140 mmol/L (ref 136–145)
eGFRcr - FEMALE: >90 mL/min/1.73mˆ2 (ref >=60)

## 2023-12-04 MED ORDER — MEMANTINE 5 MG TABLET: 5.0000 mg | ORAL_TABLET | Freq: Two times a day (BID) | ORAL | 1 refills | Status: DC

## 2023-12-04 NOTE — Nursing Note (Signed)
 Venipuncture performed in office on right arm antecubital vein, dry pressure dressing was applied to site and patient tolerated it well.  Specimen was centrifuged, aliquoted as needed and specimen was labeled and packaged for transport.

## 2023-12-05 ENCOUNTER — Other Ambulatory Visit (INDEPENDENT_AMBULATORY_CARE_PROVIDER_SITE_OTHER): Payer: Self-pay | Admitting: FAMILY MEDICINE

## 2023-12-05 ENCOUNTER — Ambulatory Visit (INDEPENDENT_AMBULATORY_CARE_PROVIDER_SITE_OTHER): Payer: Self-pay | Admitting: FAMILY MEDICINE

## 2023-12-05 NOTE — Telephone Encounter (Signed)
 Please let the patient know her bmp was normal and her A1c was 6.9. Will discuss increase in Mounjaro  at her f/u apt since Namenda  increased this visit.

## 2023-12-20 ENCOUNTER — Ambulatory Visit
Admission: RE | Admit: 2023-12-20 | Discharge: 2023-12-20 | Disposition: A | Discharge status: Home / Self Care | Payer: Self-pay | Source: Ambulatory Visit | Attending: FAMILY MEDICINE | Admitting: FAMILY MEDICINE

## 2023-12-20 ENCOUNTER — Other Ambulatory Visit: Payer: Self-pay

## 2023-12-20 DIAGNOSIS — R011 Cardiac murmur, unspecified: Secondary | ICD-10-CM | POA: Insufficient documentation

## 2023-12-20 NOTE — Telephone Encounter (Signed)
 Please let the patient know that her echo ejection was normal. There was no gross pathology.

## 2023-12-23 NOTE — Telephone Encounter (Signed)
 Spoke with pt and notified her of this information/these results - discussed. She voiced understanding. Titus Mould, RN

## 2023-12-23 NOTE — Telephone Encounter (Signed)
-----   Message from Ashby Cumber, MD sent at 12/20/2023  8:24 PM EST -----      ----- Message -----  From: Edwina Landsberg Results  Sent: 12/20/2023   7:49 PM EST  To: Ashby Cumber, MD

## 2024-01-02 ENCOUNTER — Other Ambulatory Visit (INDEPENDENT_AMBULATORY_CARE_PROVIDER_SITE_OTHER): Payer: Self-pay | Admitting: FAMILY MEDICINE

## 2024-01-02 MED ORDER — GABAPENTIN 600 MG TABLET: 600.0000 mg | ORAL_TABLET | Freq: Three times a day (TID) | ORAL | 0 refills | Status: DC

## 2024-01-13 ENCOUNTER — Ambulatory Visit
Admission: RE | Admit: 2024-01-13 | Discharge: 2024-01-13 | Disposition: A | Discharge status: Home / Self Care | Payer: Self-pay | Source: Ambulatory Visit | Attending: FAMILY MEDICINE | Admitting: FAMILY MEDICINE

## 2024-01-13 ENCOUNTER — Other Ambulatory Visit (INDEPENDENT_AMBULATORY_CARE_PROVIDER_SITE_OTHER): Payer: Self-pay | Admitting: FAMILY MEDICINE

## 2024-01-13 ENCOUNTER — Ambulatory Visit (INDEPENDENT_AMBULATORY_CARE_PROVIDER_SITE_OTHER): Payer: Self-pay | Admitting: FAMILY MEDICINE

## 2024-01-13 ENCOUNTER — Encounter (HOSPITAL_COMMUNITY): Payer: Self-pay

## 2024-01-13 ENCOUNTER — Other Ambulatory Visit: Payer: Self-pay

## 2024-01-13 DIAGNOSIS — Z1231 Encounter for screening mammogram for malignant neoplasm of breast: Secondary | ICD-10-CM | POA: Insufficient documentation

## 2024-01-13 DIAGNOSIS — Z1239 Encounter for other screening for malignant neoplasm of breast: Secondary | ICD-10-CM

## 2024-01-13 NOTE — Telephone Encounter (Signed)
-----   Message from Ashby Cumber, MD sent at 01/13/2024  5:20 PM EST -----      ----- Message -----  From: Vicci Katz, MD  Sent: 01/13/2024   4:35 PM EST  To: Ashby Cumber, MD

## 2024-01-13 NOTE — Telephone Encounter (Signed)
 Check to see how the patient did with the increase in the Namenda . Her usual dose of Mounjaro  was filled. If she drop off or mail in her BP and sugar logs will consider increasing her dose of Mounjaro  when she needs her next refill. Check to see if she is still tolerating all her meds well.

## 2024-01-13 NOTE — Telephone Encounter (Signed)
 Please let the patient know that her mammogram was read as benign. Remind the patient if she has any symptoms she needs to be seen.

## 2024-01-13 NOTE — Telephone Encounter (Signed)
 Called and left message mammogram results are normal and to continue to be seen if any problems.

## 2024-01-17 ENCOUNTER — Ambulatory Visit (INDEPENDENT_AMBULATORY_CARE_PROVIDER_SITE_OTHER): Payer: Self-pay | Admitting: FAMILY MEDICINE

## 2024-01-17 NOTE — Telephone Encounter (Signed)
 Fax received from coverm[employer]eds stating prior authorization needed on Mounjaro . PA completed via covermymeds. Per patient insurance, Authorization already on file for this request. Authorization starting on 10/23/2023 and ending on 01/21/2025.

## 2024-01-30 LAB — QUEST MISC ORDER - FROZEN

## 2024-01-31 ENCOUNTER — Other Ambulatory Visit (INDEPENDENT_AMBULATORY_CARE_PROVIDER_SITE_OTHER): Payer: Self-pay | Admitting: FAMILY MEDICINE

## 2024-01-31 MED ORDER — GABAPENTIN 600 MG TABLET: 600.0000 mg | ORAL_TABLET | Freq: Three times a day (TID) | ORAL | 0 refills | Status: AC

## 2024-02-10 ENCOUNTER — Other Ambulatory Visit (INDEPENDENT_AMBULATORY_CARE_PROVIDER_SITE_OTHER): Payer: Self-pay | Admitting: FAMILY MEDICINE

## 2024-02-10 MED ORDER — MEMANTINE 5 MG TABLET: 5.0000 mg | ORAL_TABLET | Freq: Two times a day (BID) | ORAL | 1 refills | Status: AC

## 2024-02-20 ENCOUNTER — Other Ambulatory Visit (INDEPENDENT_AMBULATORY_CARE_PROVIDER_SITE_OTHER): Payer: Self-pay | Admitting: FAMILY MEDICINE

## 2024-02-25 ENCOUNTER — Other Ambulatory Visit (INDEPENDENT_AMBULATORY_CARE_PROVIDER_SITE_OTHER): Payer: Self-pay | Admitting: FAMILY MEDICINE

## 2024-02-25 NOTE — Telephone Encounter (Signed)
 Duplicate Mounjaro . Filled 01/13/24 for 6 months.

## 2024-02-27 ENCOUNTER — Other Ambulatory Visit (INDEPENDENT_AMBULATORY_CARE_PROVIDER_SITE_OTHER): Payer: Self-pay | Admitting: FAMILY MEDICINE

## 2024-03-30 ENCOUNTER — Ambulatory Visit (HOSPITAL_BASED_OUTPATIENT_CLINIC_OR_DEPARTMENT_OTHER): Payer: Self-pay | Admitting: NEUROLOGY

## 2024-04-06 ENCOUNTER — Ambulatory Visit (INDEPENDENT_AMBULATORY_CARE_PROVIDER_SITE_OTHER): Payer: Self-pay | Admitting: FAMILY MEDICINE

## 2024-05-06 ENCOUNTER — Ambulatory Visit (HOSPITAL_BASED_OUTPATIENT_CLINIC_OR_DEPARTMENT_OTHER): Payer: Self-pay | Admitting: NEUROLOGY

## 2025-02-12 ENCOUNTER — Ambulatory Visit
